# Patient Record
Sex: Male | Born: 1965 | Race: White | Hispanic: No | Marital: Single | Smoking: Current some day smoker
Health system: Southern US, Community
[De-identification: ages and names within clinical notes are randomized; demographics above are authoritative.]

## PROBLEM LIST (undated history)

## (undated) ENCOUNTER — Emergency Department (HOSPITAL_COMMUNITY): Admission: EM | Payer: BC Managed Care – PPO | Source: Home / Self Care

## (undated) DIAGNOSIS — T7840XA Allergy, unspecified, initial encounter: Secondary | ICD-10-CM

## (undated) DIAGNOSIS — E119 Type 2 diabetes mellitus without complications: Secondary | ICD-10-CM

## (undated) DIAGNOSIS — Z87442 Personal history of urinary calculi: Secondary | ICD-10-CM

## (undated) DIAGNOSIS — F32A Depression, unspecified: Secondary | ICD-10-CM

## (undated) DIAGNOSIS — F419 Anxiety disorder, unspecified: Secondary | ICD-10-CM

## (undated) DIAGNOSIS — G4730 Sleep apnea, unspecified: Secondary | ICD-10-CM

## (undated) DIAGNOSIS — F329 Major depressive disorder, single episode, unspecified: Secondary | ICD-10-CM

## (undated) HISTORY — DX: Depression, unspecified: F32.A

## (undated) HISTORY — DX: Allergy, unspecified, initial encounter: T78.40XA

## (undated) HISTORY — DX: Major depressive disorder, single episode, unspecified: F32.9

## (undated) HISTORY — DX: Anxiety disorder, unspecified: F41.9

---

## 2010-01-27 ENCOUNTER — Encounter: Admission: RE | Admit: 2010-01-27 | Discharge: 2010-01-27 | Payer: Self-pay | Admitting: Specialist

## 2010-01-28 ENCOUNTER — Encounter: Admission: RE | Admit: 2010-01-28 | Discharge: 2010-01-28 | Payer: Self-pay | Admitting: Specialist

## 2012-05-28 ENCOUNTER — Ambulatory Visit (INDEPENDENT_AMBULATORY_CARE_PROVIDER_SITE_OTHER): Payer: BC Managed Care – PPO | Admitting: Family Medicine

## 2012-05-28 VITALS — BP 134/82 | HR 90 | Temp 98.6°F | Resp 16 | Ht 61.78 in | Wt 293.0 lb

## 2012-05-28 DIAGNOSIS — L237 Allergic contact dermatitis due to plants, except food: Secondary | ICD-10-CM

## 2012-05-28 DIAGNOSIS — L255 Unspecified contact dermatitis due to plants, except food: Secondary | ICD-10-CM

## 2012-05-28 MED ORDER — PREDNISONE 20 MG PO TABS: ORAL_TABLET | ORAL | Status: AC

## 2012-05-28 NOTE — Progress Notes (Signed)
   Date:  05/28/2012   Name:  Evan Hamilton   DOB:  02/10/1966   MRN:  [id number]  PCP:  No primary provider on file.    Chief Complaint: Rash   History of Present Illness:  Evan Hamilton is a 46 y.o. very pleasant male patient who presents with the following:  He was hiking a couple of weeks ago- seemed to get into some PI. Had a rash on both arms, his trunk, and especially his left leg.  It was getting better, but then seemed to be getting worse again last night.  He has used benadryl and OTC PI medications, and has tried to wash anything that might have PI oil on it  Otherwise he is feeling ok, does not have any fever or other symptoms and he does not have DM or glucose problems that he knows of  There is no problem list on file for this patient.  No past medical history on file. No past surgical history on file. History  Substance Use Topics  . Smoking status: Current Some Day Smoker    Types: Cigars  . Smokeless tobacco: Not on file  . Alcohol Use: Not on file   No family history on file. Allergies not on file  Medication list has been reviewed and updated.  No current outpatient prescriptions on file prior to visit.    Review of Systems:  As per HPI- otherwise negative.  Physical Examination: Filed Vitals:   05/28/12 1521  BP: 134/82  Pulse: 90  Temp: 98.6 F (37 C)  Resp: 16   Filed Vitals:   05/28/12 1521  Height: 5' 1.78" (1.569 m)  Weight: 293 lb (132.904 kg)   Body mass index is 53.97 kg/(m^2). Ideal Body Weight: Weight in (lb) to have BMI = 25: 135.4   GEN: WDWN, NAD, Non-toxic, A & O x 3, obese HEENT: Atraumatic, Normocephalic. Neck supple. No masses, No LAD.  Oropharynx wnl Ears and Nose: No external deformity. CV: RRR, No M/G/R. No JVD. No thrill. No extra heart sounds. PULM: CTA B, no wheezes, crackles, rhonchi. No retractions. No resp. distress. No accessory muscle use. EXTR: No c/c/e.  Rash typical of rhus dermatitis on both arms, which  appears to be healing.  However, he does have fresher appearing rash on his left leg.  No sign of superinfection or other rash NEURO Normal gait.  PSYCH: Normally interactive. Conversant. Not depressed or anxious appearing.  Calm demeanor.    Assessment and Plan: 1. Poison ivy  predniSONE (DELTASONE) 20 MG tablet   Will treat with a 10 day course of oral prednisone.  Patient (or parent if minor) instructed to return to clinic or call if not better in 2-2 day(s).  Sooner if worse.      Abbe Amsterdam, MD

## 2013-10-09 ENCOUNTER — Ambulatory Visit (INDEPENDENT_AMBULATORY_CARE_PROVIDER_SITE_OTHER): Payer: BC Managed Care – PPO | Admitting: Family Medicine

## 2013-10-09 ENCOUNTER — Ambulatory Visit: Payer: BC Managed Care – PPO

## 2013-10-09 VITALS — BP 128/80 | HR 72 | Temp 99.1°F | Resp 18 | Ht 73.0 in | Wt >= 6400 oz

## 2013-10-09 DIAGNOSIS — Z131 Encounter for screening for diabetes mellitus: Secondary | ICD-10-CM

## 2013-10-09 DIAGNOSIS — R109 Unspecified abdominal pain: Secondary | ICD-10-CM

## 2013-10-09 DIAGNOSIS — R319 Hematuria, unspecified: Secondary | ICD-10-CM

## 2013-10-09 DIAGNOSIS — R300 Dysuria: Secondary | ICD-10-CM

## 2013-10-09 LAB — POCT URINALYSIS DIPSTICK
Bilirubin, UA: NEGATIVE
Glucose, UA: NEGATIVE
Ketones, UA: NEGATIVE
Leukocytes, UA: NEGATIVE
Nitrite, UA: NEGATIVE
Protein, UA: 100
Spec Grav, UA: 1.025
Urobilinogen, UA: 0.2
pH, UA: 5

## 2013-10-09 LAB — POCT UA - MICROSCOPIC ONLY
Casts, Ur, LPF, POC: NEGATIVE
Crystals, Ur, HPF, POC: NEGATIVE
Mucus, UA: NEGATIVE
Yeast, UA: NEGATIVE

## 2013-10-09 LAB — POCT CBC
Granulocyte percent: 59.3 %G (ref 37–80)
HCT, POC: 44.3 % (ref 43.5–53.7)
Hemoglobin: 14.6 g/dL (ref 14.1–18.1)
Lymph, poc: 3.3 (ref 0.6–3.4)
MCH, POC: 30.2 pg (ref 27–31.2)
MCHC: 33 g/dL (ref 31.8–35.4)
MCV: 91.7 fL (ref 80–97)
MID (cbc): 0.6 (ref 0–0.9)
MPV: 8.8 fL (ref 0–99.8)
POC Granulocyte: 5.6 (ref 2–6.9)
POC LYMPH PERCENT: 34.4 %L (ref 10–50)
POC MID %: 6.3 % (ref 0–12)
Platelet Count, POC: 171 10*3/uL (ref 142–424)
RBC: 4.83 M/uL (ref 4.69–6.13)
RDW, POC: 13 %
WBC: 9.5 10*3/uL (ref 4.6–10.2)

## 2013-10-09 LAB — POCT GLYCOSYLATED HEMOGLOBIN (HGB A1C): Hemoglobin A1C: 7.2

## 2013-10-09 MED ORDER — TRAMADOL HCL 50 MG PO TABS: 50.0000 mg | ORAL_TABLET | Freq: Three times a day (TID) | ORAL | Status: DC | PRN

## 2013-10-09 MED ORDER — CIPROFLOXACIN HCL 500 MG PO TABS: 500.0000 mg | ORAL_TABLET | Freq: Two times a day (BID) | ORAL | Status: DC

## 2013-10-09 MED ORDER — TAMSULOSIN HCL 0.4 MG PO CAPS: 0.4000 mg | ORAL_CAPSULE | Freq: Every day | ORAL | Status: DC

## 2013-10-09 MED ORDER — KETOROLAC TROMETHAMINE 60 MG/2ML IM SOLN
60.0000 mg | Freq: Once | INTRAMUSCULAR | Status: AC
  Administered 2013-10-09: 60 mg via INTRAMUSCULAR

## 2013-10-09 NOTE — Patient Instructions (Signed)

## 2013-10-09 NOTE — Progress Notes (Signed)
 [facility]:  Office Visit  Chief Complaint:  Chief Complaint  Patient presents with  . Flank Pain    started last friday on left side no improvements    HPI: Evan Hamilton is a 47 y.o. male who is here for  Left sided flank pain, depends on how he twist, has had back pain for several years, he had an MRI for T12 compression fracture but this does not feel like it.  Subjective  fevers and chills 2 weekends ago, urinating blood x 1 on Monday, this has been going on for 5 days now.  He deneis blood in stool. Stabbing 10/10 pain. He nearly  keels over when this happens Able to eat and drink ok, subsides with food.  Denies any  abd pain No history of kideny stones.  Has been drinking a lot of water, drinking cranberry juice, had an antibiotics he orgot the name only for 2 days Was urinating more frequently and got better after that , better flow and more steady He lost 18 lbs in the last 3 weeks intentionally, it was a lot faster than he thought, drinking protein shakes for breakfast, luncha nd eating a sensible dinner. He deneis h.o Dm but has a fmaily hsitory of it so would like to check. No polyuria/polydipsia.     Past Medical History  Diagnosis Date  . Allergy    History reviewed. No pertinent past surgical history. History   Social History  . Marital Status: Married    Spouse Name: N/A    Number of Children: N/A  . Years of Education: N/A   Social History Main Topics  . Smoking status: Current Some Day Smoker    Types: Cigars  . Smokeless tobacco: None  . Alcohol Use: 1.5 oz/week    3 drink(s) per week  . Drug Use: No  . Sexual Activity: None   Other Topics Concern  . None   Social History Narrative  . None   Family History  Problem Relation Age of Onset  . Cancer Mother     Lung  . Diabetes Mother   . Heart disease Father   . Hyperlipidemia Father   . Hypertension Father   . Mental illness Sister   . Cancer Maternal Grandmother    Lung  . Diabetes Maternal Grandmother    Allergies  Allergen Reactions  . Penicillins Hives and Rash   Prior to Admission medications   Medication Sig Start Date End Date Taking? Authorizing Provider  Multiple Vitamin (MULTIVITAMIN) tablet Take 1 tablet by mouth daily.   Yes Historical Provider, MD  CINNAMON PO Take 2 tablets by mouth daily.    Historical Provider, MD     ROS: The patient denies  night sweats, unintentional weight loss, chest pain, palpitations, wheezing, dyspnea on exertion, nausea, vomiting, abdominal pain, dysuria, melena, numbness, weakness, or tingling.   All other systems have been reviewed and were otherwise negative with the exception of those mentioned in the HPI and as above.    PHYSICAL EXAM: Filed Vitals:   10/09/13 1729  BP: 128/80  Pulse: 72  Temp: 99.1 F (37.3 C)  Resp: 18   Filed Vitals:   10/09/13 1729  Height: 6\' 1"  (1.854 m)  Weight: 400 lb 9.6 oz (181.711 kg)   Body mass index is 52.86 kg/(m^2).  General: Alert, no acute distress, morbidly obese white male HEENT:  Normocephalic, atraumatic, oropharynx patent. EOMI, PERRLA Cardiovascular:  Regular rate and rhythm, no rubs murmurs  or gallops.  No Carotid bruits, radial pulse intact. No pedal edema.  Respiratory: Clear to auscultation bilaterally.  No wheezes, rales, or rhonchi.  No cyanosis, no use of accessory musculature GI: No organomegaly, abdomen is soft and non-tender, positive bowel sounds.  No masses. Skin: No rashes. Neurologic: Facial musculature symmetric. Psychiatric: Patient is appropriate throughout our interaction. Lymphatic: No cervical lymphadenopathy Musculoskeletal: Gait intact. + CVA tenderness   LABS: Results for orders placed in visit on 10/09/13  POCT CBC      Result Value Range   WBC 9.5  4.6 - 10.2 K/uL   Lymph, poc 3.3  0.6 - 3.4   POC LYMPH PERCENT 34.4  10 - 50 %L   MID (cbc) 0.6  0 - 0.9   POC MID % 6.3  0 - 12 %M   POC Granulocyte 5.6  2 - 6.9    Granulocyte percent 59.3  37 - 80 %G   RBC 4.83  4.69 - 6.13 M/uL   Hemoglobin 14.6  14.1 - 18.1 g/dL   HCT, POC 96.0  45.4 - 53.7 %   MCV 91.7  80 - 97 fL   MCH, POC 30.2  27 - 31.2 pg   MCHC 33.0  31.8 - 35.4 g/dL   RDW, POC 09.8     Platelet Count, POC 171  142 - 424 K/uL   MPV 8.8  0 - 99.8 fL  POCT UA - MICROSCOPIC ONLY      Result Value Range   WBC, Ur, HPF, POC 2-4     RBC, urine, microscopic 1-2     Bacteria, U Microscopic trace     Mucus, UA neg     Epithelial cells, urine per micros 1-2     Crystals, Ur, HPF, POC neg     Casts, Ur, LPF, POC neg     Yeast, UA neg    POCT URINALYSIS DIPSTICK      Result Value Range   Color, UA yellow     Clarity, UA clear     Glucose, UA neg     Bilirubin, UA neg     Ketones, UA neg     Spec Grav, UA 1.025     Blood, UA trace     pH, UA 5.0     Protein, UA 100     Urobilinogen, UA 0.2     Nitrite, UA neg     Leukocytes, UA Negative    POCT GLYCOSYLATED HEMOGLOBIN (HGB A1C)      Result Value Range   Hemoglobin A1C 7.2       EKG/XRAY:   Primary read interpreted by Dr. Conley Rolls at [facility].   ASSESSMENT/PLAN: Encounter Diagnoses  Name Primary?  . Flank pain Yes  . Dysuria   . Hematuria   . Screening for diabetes mellitus    Unable to get abd xray due to body habitus, if he needs a CT scan then will need to send him to [address]. Will see how sxs treatment for possible Renal stones work out first Will give patient strainer Toradol 60 mg IM  Rx Flomax and also Tramadol Push fluids F/u in 2 days by phone  Gross sideeffects, risk and benefits, and alternatives of medications d/w patient. Patient is aware that all medications have potential sideeffects and we are unable to predict every sideeffect or drug-drug interaction that may occur.  ,  PHUONG, DO 10/09/2013 8:34 PM  Spoke with patient after he left about HbA1C test  and newly dx of diabetes. We will consider metformin once we get his lab results back.

## 2013-10-10 ENCOUNTER — Telehealth: Payer: Self-pay | Admitting: Family Medicine

## 2013-10-10 LAB — COMPREHENSIVE METABOLIC PANEL
ALT: 76 U/L — ABNORMAL HIGH (ref 0–53)
AST: 49 U/L — ABNORMAL HIGH (ref 0–37)
Albumin: 4.1 g/dL (ref 3.5–5.2)
Calcium: 9.3 mg/dL (ref 8.4–10.5)
Creat: 1.02 mg/dL (ref 0.50–1.35)

## 2013-10-10 LAB — COMPREHENSIVE METABOLIC PANEL WITH GFR
Alkaline Phosphatase: 56 U/L (ref 39–117)
BUN: 13 mg/dL (ref 6–23)
CO2: 26 meq/L (ref 19–32)
Chloride: 99 meq/L (ref 96–112)
Glucose, Bld: 118 mg/dL — ABNORMAL HIGH (ref 70–99)
Potassium: 4.2 meq/L (ref 3.5–5.3)
Sodium: 134 meq/L — ABNORMAL LOW (ref 135–145)
Total Bilirubin: 0.7 mg/dL (ref 0.3–1.2)
Total Protein: 7.8 g/dL (ref 6.0–8.3)

## 2013-10-10 NOTE — Telephone Encounter (Signed)
Pain comes and goes, no worse, d/w pt dx diabetes, will await cmp befoe syarting metformin. Recheck with him in am, if no better then consider ct scan

## 2013-10-11 ENCOUNTER — Other Ambulatory Visit: Payer: Self-pay | Admitting: Family Medicine

## 2013-10-11 DIAGNOSIS — R109 Unspecified abdominal pain: Secondary | ICD-10-CM

## 2013-10-13 ENCOUNTER — Telehealth: Payer: Self-pay | Admitting: Family Medicine

## 2013-10-21 ENCOUNTER — Telehealth: Payer: Self-pay

## 2013-10-21 NOTE — Telephone Encounter (Signed)
Patient states at his last visit he discussed going on a diabetic medication. Patient wants to know if he can this med. Does not remember the name of it  [phone]

## 2013-10-21 NOTE — Telephone Encounter (Signed)
Please advise, see CMP

## 2013-10-22 MED ORDER — METFORMIN HCL 500 MG PO TABS: 500.0000 mg | ORAL_TABLET | Freq: Two times a day (BID) | ORAL | Status: DC

## 2013-10-22 NOTE — Telephone Encounter (Signed)
Patient advised per Dr Conley Rolls, he will take meds and let me know if he decides to see the nutritionist, he wants to think about it. He will let me know if he has problems with the medications.

## 2013-11-29 ENCOUNTER — Other Ambulatory Visit: Payer: Self-pay | Admitting: Family Medicine

## 2014-07-10 ENCOUNTER — Encounter: Payer: Self-pay | Admitting: Family Medicine

## 2014-07-10 ENCOUNTER — Emergency Department (HOSPITAL_COMMUNITY)
Admission: EM | Admit: 2014-07-10 | Discharge: 2014-07-10 | Disposition: A | Discharge status: Home / Self Care | Payer: BC Managed Care – PPO | Attending: Emergency Medicine | Admitting: Emergency Medicine

## 2014-07-10 ENCOUNTER — Emergency Department (HOSPITAL_COMMUNITY): Payer: BC Managed Care – PPO

## 2014-07-10 ENCOUNTER — Encounter (HOSPITAL_COMMUNITY): Payer: Self-pay | Admitting: Emergency Medicine

## 2014-07-10 ENCOUNTER — Ambulatory Visit (INDEPENDENT_AMBULATORY_CARE_PROVIDER_SITE_OTHER): Payer: BC Managed Care – PPO | Admitting: Family Medicine

## 2014-07-10 VITALS — BP 142/88 | HR 72 | Temp 97.4°F | Resp 18 | Ht 71.25 in | Wt 364.6 lb

## 2014-07-10 DIAGNOSIS — R61 Generalized hyperhidrosis: Secondary | ICD-10-CM | POA: Diagnosis not present

## 2014-07-10 DIAGNOSIS — F17200 Nicotine dependence, unspecified, uncomplicated: Secondary | ICD-10-CM | POA: Diagnosis not present

## 2014-07-10 DIAGNOSIS — Z79899 Other long term (current) drug therapy: Secondary | ICD-10-CM | POA: Diagnosis not present

## 2014-07-10 DIAGNOSIS — K8020 Calculus of gallbladder without cholecystitis without obstruction: Secondary | ICD-10-CM

## 2014-07-10 DIAGNOSIS — Z880 Allergy status to penicillin: Secondary | ICD-10-CM | POA: Insufficient documentation

## 2014-07-10 DIAGNOSIS — R079 Chest pain, unspecified: Secondary | ICD-10-CM | POA: Diagnosis present

## 2014-07-10 DIAGNOSIS — R0602 Shortness of breath: Secondary | ICD-10-CM | POA: Insufficient documentation

## 2014-07-10 DIAGNOSIS — E119 Type 2 diabetes mellitus without complications: Secondary | ICD-10-CM | POA: Insufficient documentation

## 2014-07-10 DIAGNOSIS — R1013 Epigastric pain: Secondary | ICD-10-CM

## 2014-07-10 DIAGNOSIS — Z8249 Family history of ischemic heart disease and other diseases of the circulatory system: Secondary | ICD-10-CM

## 2014-07-10 DIAGNOSIS — IMO0001 Reserved for inherently not codable concepts without codable children: Secondary | ICD-10-CM

## 2014-07-10 HISTORY — DX: Type 2 diabetes mellitus without complications: E11.9

## 2014-07-10 LAB — HEPATIC FUNCTION PANEL
ALT: 172 U/L — ABNORMAL HIGH (ref 0–53)
AST: 216 U/L — ABNORMAL HIGH (ref 0–37)
Albumin: 3.3 g/dL — ABNORMAL LOW (ref 3.5–5.2)
Alkaline Phosphatase: 106 U/L (ref 39–117)
BILIRUBIN DIRECT: 1 mg/dL — AB (ref 0.0–0.3)
BILIRUBIN INDIRECT: 0.4 mg/dL (ref 0.3–0.9)
Total Bilirubin: 1.4 mg/dL — ABNORMAL HIGH (ref 0.3–1.2)
Total Protein: 7.1 g/dL (ref 6.0–8.3)

## 2014-07-10 LAB — CBC
HCT: 41.5 % (ref 39.0–52.0)
HEMOGLOBIN: 14.7 g/dL (ref 13.0–17.0)
MCH: 29.9 pg (ref 26.0–34.0)
MCHC: 35.4 g/dL (ref 30.0–36.0)
MCV: 84.5 fL (ref 78.0–100.0)
PLATELETS: 135 10*3/uL — AB (ref 150–400)
RBC: 4.91 MIL/uL (ref 4.22–5.81)
RDW: 12.9 % (ref 11.5–15.5)
WBC: 8.2 10*3/uL (ref 4.0–10.5)

## 2014-07-10 LAB — BASIC METABOLIC PANEL
ANION GAP: 11 (ref 5–15)
BUN: 15 mg/dL (ref 6–23)
CALCIUM: 9.3 mg/dL (ref 8.4–10.5)
CO2: 29 mEq/L (ref 19–32)
Chloride: 103 mEq/L (ref 96–112)
Creatinine, Ser: 0.93 mg/dL (ref 0.50–1.35)
Glucose, Bld: 139 mg/dL — ABNORMAL HIGH (ref 70–99)
POTASSIUM: 4 meq/L (ref 3.7–5.3)
SODIUM: 143 meq/L (ref 137–147)

## 2014-07-10 LAB — I-STAT TROPONIN, ED: Troponin i, poc: 0 ng/mL (ref 0.00–0.08)

## 2014-07-10 LAB — GLUCOSE, POCT (MANUAL RESULT ENTRY): POC GLUCOSE: 67 mg/dL — AB (ref 70–99)

## 2014-07-10 LAB — LIPASE, BLOOD: LIPASE: 46 U/L (ref 11–59)

## 2014-07-10 MED ORDER — NITROGLYCERIN 0.3 MG SL SUBL: 0.4000 mg | SUBLINGUAL_TABLET | SUBLINGUAL | Status: DC | PRN

## 2014-07-10 MED ORDER — ESOMEPRAZOLE MAGNESIUM 40 MG PO CPDR: 40.0000 mg | DELAYED_RELEASE_CAPSULE | Freq: Every day | ORAL | Status: DC

## 2014-07-10 MED ORDER — ONDANSETRON 8 MG PO TBDP: ORAL_TABLET | ORAL | Status: DC

## 2014-07-10 MED ORDER — OXYCODONE-ACETAMINOPHEN 5-325 MG PO TABS: 1.0000 | ORAL_TABLET | ORAL | Status: DC | PRN

## 2014-07-10 MED ORDER — NITROGLYCERIN 0.4 MG SL SUBL: 0.4000 mg | SUBLINGUAL_TABLET | SUBLINGUAL | Status: DC | PRN

## 2014-07-10 MED ORDER — FENTANYL CITRATE 0.05 MG/ML IJ SOLN
50.0000 ug | Freq: Once | INTRAMUSCULAR | Status: AC
  Administered 2014-07-10: 50 ug via INTRAVENOUS
  Filled 2014-07-10: qty 2

## 2014-07-10 MED ORDER — NITROGLYCERIN 0.3 MG SL SUBL
0.4000 mg | SUBLINGUAL_TABLET | Freq: Once | SUBLINGUAL | Status: AC
  Administered 2014-07-10: 0.3 mg via SUBLINGUAL

## 2014-07-10 NOTE — ED Notes (Signed)
Pt presents from urgent care with chest pain. Pt reports waking up this AM with substernal CP, SOB. Pt reports taking 2 doses of alka seltzer with relief until lunchtime when the pain returned. Pt was given 2 nitro with relief of CP. Pt no c/o epigastric pain 6/10. NAD noted. VSS.

## 2014-07-10 NOTE — Discharge Instructions (Signed)
Cholecystitis °Cholecystitis is an inflammation of your gallbladder. It is usually caused by a buildup of gallstones or sludge (cholelithiasis) in your gallbladder. The gallbladder stores a fluid that helps digest fats (bile). Cholecystitis is serious and needs treatment right away.  °CAUSES  °· Gallstones. Gallstones can block the tube that leads to your gallbladder, causing bile to build up. As bile builds up, the gallbladder becomes inflamed. °· Bile duct problems, such as blockage from scarring or kinking. °· Tumors. Tumors can stop bile from leaving your gallbladder correctly, causing bile to build up. As bile builds up, the gallbladder becomes inflamed. °SYMPTOMS  °· Nausea. °· Vomiting. °· Abdominal pain, especially in the upper right area of your abdomen. °· Abdominal tenderness or bloating. °· Sweating. °· Chills. °· Fever. °· Yellowing of the skin and the whites of the eyes (jaundice). °DIAGNOSIS  °Your caregiver may order blood tests to look for infection or gallbladder problems. Your caregiver may also order imaging tests, such as an ultrasound or computed tomography (CT) scan. Further tests may include a hepatobiliary iminodiacetic acid (HIDA) scan. This scan allows your caregiver to see your bile move from the liver to the gallbladder and to the small intestine. °TREATMENT  °A hospital stay is usually necessary to lessen the inflammation of your gallbladder. You may be required to not eat or drink (fast) for a certain amount of time. You may be given medicine to treat pain or an antibiotic medicine to treat an infection. Surgery may be needed to remove your gallbladder (cholecystectomy) once the inflammation has gone down. Surgery may be needed right away if you develop complications such as death of gallbladder tissue (gangrene) or a tear (perforation) of the gallbladder.  °HOME CARE INSTRUCTIONS  °Home care will depend on your treatment. In general: °· If you were given antibiotics, take them as  directed. Finish them even if you start to feel better. °· Only take over-the-counter or prescription medicines for pain, discomfort, or fever as directed by your caregiver. °· Follow a low-fat diet until you see your caregiver again. °· Keep all follow-up visits as directed by your caregiver. °SEEK IMMEDIATE MEDICAL CARE IF:  °· Your pain is increasing and not controlled by medicines. °· Your pain moves to another part of your abdomen or to your back. °· You have a fever. °· You have nausea and vomiting. °MAKE SURE YOU: °· Understand these instructions. °· Will watch your condition. °· Will get help right away if you are not doing well or get worse. °Document Released: 11/06/2005 Document Revised: 01/29/2012 Document Reviewed: 09/22/2011 °Exit[employer]® Patient Information ©2015 [employer]. This information is not intended to replace advice given to you by your health care provider. Make sure you discuss any questions you have with your health care provider. ° °Low-Fat Diet for Pancreatitis or Gallbladder Conditions °A low-fat diet can be helpful if you have pancreatitis or a gallbladder condition. With these conditions, your pancreas and gallbladder have trouble digesting fats. A healthy eating plan with less fat will help rest your pancreas and gallbladder and reduce your symptoms. °WHAT DO I NEED TO KNOW ABOUT THIS DIET? °· Eat a low-fat diet. °¨ Reduce your fat intake to less than 20-30% of your total daily calories. This is less than 50-60 g of fat per day. °¨ Remember that you need some fat in your diet. Ask your dietician what your daily goal should be. °¨ Choose nonfat and low-fat healthy foods. Look for the words "nonfat," "low fat," or "  fat free." °¨ As a guide, look on the label and choose foods with less than 3 g of fat per serving. Eat only one serving. °· Avoid alcohol. °· Do not smoke. If you need help quitting, talk with your health care provider. °· Eat small frequent meals instead of three large  heavy meals. °WHAT FOODS CAN I EAT? °Grains °Include healthy grains and starches such as potatoes, wheat bread, fiber-rich cereal, and brown rice. Choose whole grain options whenever possible. In adults, whole grains should account for 45-65% of your daily calories.  °Fruits and Vegetables °Eat plenty of fruits and vegetables. Fresh fruits and vegetables add fiber to your diet. °Meats and Other Protein Sources °Eat lean meat such as chicken and pork. Trim any fat off of meat before cooking it. Eggs, fish, and beans are other sources of protein. In adults, these foods should account for 10-35% of your daily calories. °Dairy °Choose low-fat milk and dairy options. Dairy includes fat and protein, as well as calcium.  °Fats and Oils °Limit high-fat foods such as fried foods, sweets, baked goods, sugary drinks.  °Other °Creamy sauces and condiments, such as mayonnaise, can add extra fat. Think about whether or not you need to use them, or use smaller amounts or low fat options. °WHAT FOODS ARE NOT RECOMMENDED? °· High fat foods, such as: °¨ Baked goods. °¨ Ice cream. °¨ French toast. °¨ Sweet rolls. °¨ Pizza. °¨ Cheese bread. °¨ Foods covered with batter, butter, creamy sauces, or cheese. °¨ Fried foods. °¨ Sugary drinks and desserts. °· Foods that cause gas or bloating °Document Released: 11/11/2013 Document Reviewed: 11/11/2013 °ExitCare® Patient Information ©2015 [employer]. This information is not intended to replace advice given to you by your health care provider. Make sure you discuss any questions you have with your health care provider. ° °

## 2014-07-10 NOTE — ED Provider Notes (Signed)
CSN: [id number]     Arrival date & time 07/10/14  1525 History   First MD Initiated Contact with Patient 07/10/14 1526     Chief Complaint  Patient presents with  . Chest Pain     (Consider location/radiation/quality/duration/timing/severity/associated sxs/prior Treatment) HPI Comments: Pt presents with upper abd/chest pain.  He started having a sharp/aching pain to epigastric area, sternum about 7am this morning.  It has been constant since that time.  He says that he has had some associated SOB, no n/v, did get diaphoretic with the intense pain.  No hx of heart problems in the past.  Has recently diagnosed DM, started on metformin, but he stopped it himself after he didn't like the side effects.  No known hx of HTN, hyperlipidemia.  +FH of heart dz in his father in his late 5550s.  Denies f/c, cough, leg swelling/pain.  Says pain has radiated around to back and under right ribcage.  Pt was given nitro PTA which did improve some of the pain, but now hurts more in epigastrium, RUQ.  No ASA given.  Patient is a 48 y.o. male presenting with chest pain.  Chest Pain Associated symptoms: abdominal pain, diaphoresis and shortness of breath   Associated symptoms: no back pain, no cough, no dizziness, no fatigue, no fever, no headache, no nausea, no numbness, not vomiting and no weakness     Past Medical History  Diagnosis Date  . Allergy   . Diabetes mellitus without complication    History reviewed. No pertinent past surgical history. Family History  Problem Relation Age of Onset  . Cancer Mother     Lung  . Diabetes Mother   . Heart disease Father   . Hyperlipidemia Father   . Hypertension Father   . Mental illness Sister   . Cancer Maternal Grandmother     Lung  . Diabetes Maternal Grandmother    History  Substance Use Topics  . Smoking status: Current Some Day Smoker    Types: Cigars  . Smokeless tobacco: Not on file  . Alcohol Use: 1.5 oz/week    3 drink(s) per week     Review of Systems  Constitutional: Positive for diaphoresis. Negative for fever, chills and fatigue.  HENT: Negative for congestion, rhinorrhea and sneezing.   Eyes: Negative.   Respiratory: Positive for shortness of breath. Negative for cough and chest tightness.   Cardiovascular: Positive for chest pain. Negative for leg swelling.  Gastrointestinal: Positive for abdominal pain. Negative for nausea, vomiting, diarrhea and blood in stool.  Genitourinary: Negative for frequency, hematuria, flank pain and difficulty urinating.  Musculoskeletal: Negative for arthralgias and back pain.  Skin: Negative for rash.  Neurological: Negative for dizziness, speech difficulty, weakness, numbness and headaches.      Allergies  Penicillins  Home Medications   Prior to Admission medications   Medication Sig Start Date End Date Taking? Authorizing Provider  Cyanocobalamin (VITAMIN B-12 PO) Take 1 tablet by mouth daily.   Yes Historical Provider, MD  Ginkgo Biloba (GINKOBA PO) Take 1 tablet by mouth daily.   Yes Historical Provider, MD  Multiple Vitamin (MULTIVITAMIN) tablet Take 1 tablet by mouth daily.   Yes Historical Provider, MD  nitroGLYCERIN (NITROSTAT) 0.4 MG SL tablet Place 0.4 mg under the tongue every 5 (five) minutes as needed for chest pain.   Yes Historical Provider, MD  omega-3 acid ethyl esters (LOVAZA) 1 G capsule Take 1 g by mouth daily.   Yes Historical Provider, MD  POTASSIUM  GLUCONATE PO Take 1 tablet by mouth daily.   Yes Historical Provider, MD  Probiotic Product (PROBIOTIC PO) Take 1 tablet by mouth daily.   Yes Historical Provider, MD  esomeprazole (NEXIUM) 40 MG capsule Take 1 capsule (40 mg total) by mouth daily. 07/10/14   Rolan Bucco, MD  ondansetron (ZOFRAN ODT) 8 MG disintegrating tablet 8mg  ODT q4 hours prn nausea 07/10/14   Rolan Bucco, MD  oxyCODONE-acetaminophen (PERCOCET) 5-325 MG per tablet Take 1-2 tablets by mouth every 4 (four) hours as needed. 07/10/14    Rolan Bucco, MD   BP 115/69  Pulse 60  Temp(Src) 98 F (36.7 C) (Oral)  Resp 15  Ht 6\' 2"  (1.88 m)  Wt 360 lb (163.295 kg)  BMI 46.20 kg/m2  SpO2 98% Physical Exam  Constitutional: He is oriented to person, place, and time. He appears well-developed and well-nourished.  HENT:  Head: Normocephalic and atraumatic.  Eyes: Pupils are equal, round, and reactive to light.  Neck: Normal range of motion. Neck supple.  Cardiovascular: Normal rate, regular rhythm and normal heart sounds.   Pulmonary/Chest: Effort normal and breath sounds normal. No respiratory distress. He has no wheezes. He has no rales. He exhibits no tenderness.  Abdominal: Soft. Bowel sounds are normal. There is tenderness (+TTP epigastrium, RUQ). There is no rebound and no guarding.  Musculoskeletal: Normal range of motion. He exhibits no edema.  Lymphadenopathy:    He has no cervical adenopathy.  Neurological: He is alert and oriented to person, place, and time.  Skin: Skin is warm and dry. No rash noted.  Psychiatric: He has a normal mood and affect.    ED Course  Procedures (including critical care time) Labs Review Labs Reviewed  CBC - Abnormal; Notable for the following:    Platelets 135 (*)    All other components within normal limits  BASIC METABOLIC PANEL - Abnormal; Notable for the following:    Glucose, Bld 139 (*)    All other components within normal limits  HEPATIC FUNCTION PANEL - Abnormal; Notable for the following:    Albumin 3.3 (*)    AST 216 (*)    ALT 172 (*)    Total Bilirubin 1.4 (*)    Bilirubin, Direct 1.0 (*)    All other components within normal limits  LIPASE, BLOOD  I-STAT TROPOININ, ED    Imaging Review Dg Chest 2 View  07/10/2014   CLINICAL DATA:  Short of breath.  Chest pain and epigastric pain  EXAM: CHEST  2 VIEW  COMPARISON:  None  FINDINGS: Normal heart size. No pleural effusion or edema. Scar is identified within the left base. No airspace consolidation. Right lung  is clear. T12 compression fracture is again noted.  IMPRESSION: 1. No acute cardiopulmonary abnormalities.   Electronically Signed   By: Signa Kell M.D.   On: 07/10/2014 17:11   US Abdomen Complete  07/10/2014   CLINICAL DATA:  Abdominal pain.  EXAM: ULTRASOUND ABDOMEN COMPLETE  COMPARISON:  None.  FINDINGS: Gallbladder:  Cholelithiasis is noted with 3 cm gallstone noted in neck of gallbladder. No significant gallbladder wall thickening or pericholecystic fluid is noted. No sonographic Murphy's sign is noted  Common bile duct:  Diameter: Measures 4.2 mm which is within normal limits.  Liver:  Increased echogenicity of hepatic parenchyma is noted suggesting fatty infiltration. No focal abnormality is noted.  IVC:  No abnormality visualized.  Pancreas:  Not well visualized due to body habitus.  Spleen:  Size and appearance  within normal limits.  Right Kidney:  Length: 14.5 cm. 1.8 cm cyst is noted in lower pole. Echogenicity within normal limits. No mass or hydronephrosis visualized.  Left Kidney:  Length: 14.4 cm. 3.2 cm cyst seen in lower pole. Echogenicity within normal limits. No mass or hydronephrosis visualized.  Abdominal aorta:  No aneurysm visualized.  Other findings:  None.  IMPRESSION: Cholelithiasis is noted without definite evidence of cholecystitis. Fatty infiltration of the liver.   Electronically Signed   By: Roque Lias M.D.   On: 07/10/2014 18:11     EKG Interpretation   Date/Time:  Friday July 10 2014 15:36:28 EDT Ventricular Rate:  56 PR Interval:  128 QRS Duration: 101 QT Interval:  416 QTC Calculation: 401 R Axis:   47 Text Interpretation:  Sinus rhythm Abnormal inferior Q waves No old  tracing to compare Confirmed by Sabin Gibeault  MD, Eiley Mcginnity ([id number]) on 07/10/2014  5:16:26 PM      MDM   Final diagnoses:  Gallstones    Patient presents with epigastric chest and right upper quadrant pain. He is tender on palpation to the epigastrium and right upper quadrant. He has  evidence of gallstones with a large 3 cm gallstone present. He also has elevation of his liver enzymes however his lipase is normal. I feel the symptoms are most consistent with gallbladder disease rather than angina. His EKG did not show ischemic changes and his troponin is negative. He's had no ongoing chest pain. His pain is currently dislocated in his upper abdomen. I consulted with surgery given his elevated liver enzymes. Patient has pain controlled in the ED. We will go ahead and send him home with close followup with surgery. I advised the patient of a strict low-fat diet. I gave him a prescription for Percocet Zofran and Nexium. Advised to return here if he has worsening pain vomiting or fevers. Otherwise he is to followup with the surgery.    Rolan Bucco, MD 07/10/14 [phone]

## 2014-07-10 NOTE — Progress Notes (Signed)
Subjective:    Patient ID: Evan Hamilton, male    DOB: Oct 11, 1966, 48 y.o.   MRN: [id number] This chart was scribed for Meredith StaggersJeffrey Misha Antonini, MD by Julian HyMorgan Graham, ED Scribe. The patient was seen in Room/bed info not found. The patient's care was started at 2:53 PM.   07/10/2014  Pt pulled acutely from waiting room, and then seen acutely by MD after EKG obtained d/t chest pain and symptoms.   Chest Pain  Associated symptoms include abdominal pain, diaphoresis and shortness of breath. Pertinent negatives include no nausea or vomiting.   Evan PartyMark Hamilton is a 48 y.o. male No PCP Per Patient  Pt reports he has mid-sternal constant, gradually worsening "sharp" chest pain onset approximately 7 am. Pt reports some diaphoresis. Pt reports he thought he had indigestion and attempted to belch with minimal relief. Pt reports he feels flush and has had some diaphoresis here in the office. Pt reports his father was in his 6450s when he started having heart issues. Pt reports his pain was 9/10 this morning, decreased and is currently 9/10. Pt reports his pain worsened after eating a salami and cheese sandwich. Pt denies his pain radiates down his arms. Pt does reports his pain radiates to the sides. Pt reports his father had stents and MI. Positive for SOB and LUQ abdominal pain. Pt reports he took 2 packets of Alka-Seltzer with minimal relief. Pt denies hx of bleeding ulcers in the abdomen. Pt reports he has limited feeling below his knees bilaterally onset several years ago.  In November 2014 A1C of 7.2 started on Metformin 500 mg but did not tolerate this due to feeling zombie-like, so stopped approximately 8 months ago. Pt denies taking any medications everyday including aspirin. Pt reports hx of indigestion. Pt denies nausea and vomiting.  Review of Systems  Constitutional: Positive for diaphoresis. Negative for chills.  Respiratory: Positive for shortness of breath.   Cardiovascular: Positive for chest pain.    Gastrointestinal: Positive for abdominal pain. Negative for nausea and vomiting.    Past Medical History  Diagnosis Date  . Allergy    No past surgical history on file. Allergies  Allergen Reactions  . Penicillins Hives and Rash   Current Outpatient Prescriptions  Medication Sig Dispense Refill  . CINNAMON PO Take 2 tablets by mouth daily.      . ciprofloxacin (CIPRO) 500 MG tablet Take 1 tablet (500 mg total) by mouth 2 (two) times daily.  10 tablet  0  . metFORMIN (GLUCOPHAGE) 500 MG tablet Take 1 tablet (500 mg total) by mouth 2 (two) times daily with a meal.  60 tablet  3  . Multiple Vitamin (MULTIVITAMIN) tablet Take 1 tablet by mouth daily.      . tamsulosin (FLOMAX) 0.4 MG CAPS capsule Take 1 capsule (0.4 mg total) by mouth daily.  30 capsule  0  . traMADol (ULTRAM) 50 MG tablet Take 1 tablet (50 mg total) by mouth every 8 (eight) hours as needed. Severe pain  30 tablet  0   No current facility-administered medications for this visit.       Objective:  Triage Vitals: BP 142/88  Pulse 72  Temp(Src) 97.4 F (36.3 C) (Oral)  Resp 18  Ht 5' 11.25" (1.81 m)  Wt 364 lb 9.6 oz (165.381 kg)  BMI 50.48 kg/m2  SpO2 98% Physical Exam  Vitals reviewed. Constitutional: He is oriented to person, place, and time. He appears well-developed and well-nourished.  Overweight, appears uncomfortable.  HENT:  Head: Normocephalic and atraumatic.  Eyes: EOM are normal. Pupils are equal, round, and reactive to light.  Neck: No JVD present. Carotid bruit is not present.  Cardiovascular: Normal rate, regular rhythm and normal heart sounds.   No murmur heard. Pulmonary/Chest: Effort normal and breath sounds normal. He has no rales.  Abdominal:  Epigastrium slight tenderness. No palpable pulsatile mass.  Musculoskeletal: He exhibits no edema.  Neurological: He is alert and oriented to person, place, and time.  Skin: Skin is warm. He is diaphoretic.  Psychiatric: He has a normal mood and  affect.   EMS called for transport approximately 14:50PM 2:54 PM-Manual BP reading 16109 we are going to give NTZ 0.4 mg/tablet.  EKG: sr, possible small Q in II and III, nonspecific ST anterolaterally without apparent ST elevation. No prior available for review.  Results for orders placed in visit on 07/10/14  GLUCOSE, POCT (MANUAL RESULT ENTRY)      Result Value Ref Range   POC Glucose 67 (*) 70 - 99 mg/dl     Point of Care glucose of 67. Assessment & Plan:  3:06 PM- Patient informed of current plan for treatment and evaluation and agrees with plan at this time.  1. Chest pain, unspecified   2. Type 2 diabetes mellitus without complication   3. Sweating   4. Abdominal pain, epigastric   5. Family history of heart disease    Substernal, lower mid chest pain since 7am, epigastric pain, diaphoretic in office with hx of DM2, off meds for past 8 months d/t intolerance.   Fh of early CAD.  DDX of esophageal cause with hx of GERD, but with diaphoresis and possible nonspecific EKG findings (no prior for comparison), ACS also possible. S/p ASA this am with alka seltzer,  NTG 0.4mg  SL x1 given here without relief. IV placed, monitor and transport to ER by EMS. charge nurse advised.   I personally performed the services described in this documentation, which was scribed in my presence. The recorded information has been reviewed and considered, and addended by me as needed.

## 2014-07-18 ENCOUNTER — Encounter (HOSPITAL_COMMUNITY): Payer: Self-pay | Admitting: Emergency Medicine

## 2014-07-18 ENCOUNTER — Inpatient Hospital Stay (HOSPITAL_COMMUNITY)
Admission: EM | Admit: 2014-07-18 | Discharge: 2014-07-23 | DRG: 418 | Disposition: A | Discharge status: Home / Self Care | Payer: BC Managed Care – PPO | Attending: Surgery | Admitting: Surgery

## 2014-07-18 DIAGNOSIS — K8060 Calculus of gallbladder and bile duct with cholecystitis, unspecified, without obstruction: Secondary | ICD-10-CM | POA: Diagnosis present

## 2014-07-18 DIAGNOSIS — E119 Type 2 diabetes mellitus without complications: Secondary | ICD-10-CM | POA: Diagnosis present

## 2014-07-18 DIAGNOSIS — Z880 Allergy status to penicillin: Secondary | ICD-10-CM

## 2014-07-18 DIAGNOSIS — Z833 Family history of diabetes mellitus: Secondary | ICD-10-CM | POA: Diagnosis not present

## 2014-07-18 DIAGNOSIS — K819 Cholecystitis, unspecified: Secondary | ICD-10-CM | POA: Diagnosis present

## 2014-07-18 DIAGNOSIS — K859 Acute pancreatitis without necrosis or infection, unspecified: Principal | ICD-10-CM | POA: Diagnosis present

## 2014-07-18 DIAGNOSIS — K8064 Calculus of gallbladder and bile duct with chronic cholecystitis without obstruction: Secondary | ICD-10-CM | POA: Diagnosis present

## 2014-07-18 DIAGNOSIS — K851 Biliary acute pancreatitis without necrosis or infection: Secondary | ICD-10-CM | POA: Diagnosis present

## 2014-07-18 DIAGNOSIS — R748 Abnormal levels of other serum enzymes: Secondary | ICD-10-CM | POA: Diagnosis present

## 2014-07-18 DIAGNOSIS — F17200 Nicotine dependence, unspecified, uncomplicated: Secondary | ICD-10-CM | POA: Diagnosis present

## 2014-07-18 DIAGNOSIS — Z6841 Body Mass Index (BMI) 40.0 and over, adult: Secondary | ICD-10-CM

## 2014-07-18 LAB — COMPREHENSIVE METABOLIC PANEL
ALK PHOS: 194 U/L — AB (ref 39–117)
ALT: 164 U/L — ABNORMAL HIGH (ref 0–53)
AST: 84 U/L — AB (ref 0–37)
Albumin: 3.6 g/dL (ref 3.5–5.2)
Anion gap: 12 (ref 5–15)
BUN: 11 mg/dL (ref 6–23)
CO2: 25 mEq/L (ref 19–32)
Calcium: 9.1 mg/dL (ref 8.4–10.5)
Chloride: 98 mEq/L (ref 96–112)
Creatinine, Ser: 0.82 mg/dL (ref 0.50–1.35)
GFR calc non Af Amer: >90 mL/min (ref >90)
GLUCOSE: 160 mg/dL — AB (ref 70–99)
Potassium: 3.9 mEq/L (ref 3.7–5.3)
Sodium: 135 mEq/L — ABNORMAL LOW (ref 137–147)
TOTAL PROTEIN: 7.8 g/dL (ref 6.0–8.3)
Total Bilirubin: 1.7 mg/dL — ABNORMAL HIGH (ref 0.3–1.2)

## 2014-07-18 LAB — CBC WITH DIFFERENTIAL/PLATELET
Basophils Absolute: 0 10*3/uL (ref 0.0–0.1)
Basophils Relative: 0 % (ref 0–1)
Eosinophils Absolute: 0.1 10*3/uL (ref 0.0–0.7)
Eosinophils Relative: 1 % (ref 0–5)
HCT: 44.1 % (ref 39.0–52.0)
Hemoglobin: 15.8 g/dL (ref 13.0–17.0)
LYMPHS ABS: 1.5 10*3/uL (ref 0.7–4.0)
LYMPHS PCT: 9 % — AB (ref 12–46)
MCH: 30.6 pg (ref 26.0–34.0)
MCHC: 35.8 g/dL (ref 30.0–36.0)
MCV: 85.5 fL (ref 78.0–100.0)
Monocytes Absolute: 0.8 10*3/uL (ref 0.1–1.0)
Monocytes Relative: 5 % (ref 3–12)
NEUTROS ABS: 13.1 10*3/uL — AB (ref 1.7–7.7)
Neutrophils Relative %: 85 % — ABNORMAL HIGH (ref 43–77)
PLATELETS: 149 10*3/uL — AB (ref 150–400)
RBC: 5.16 MIL/uL (ref 4.22–5.81)
RDW: 13.3 % (ref 11.5–15.5)
WBC: 15.5 10*3/uL — AB (ref 4.0–10.5)

## 2014-07-18 LAB — PROTIME-INR
INR: 1.07 (ref 0.00–1.49)
Prothrombin Time: 13.9 seconds (ref 11.6–15.2)

## 2014-07-18 LAB — LIPASE, BLOOD: Lipase: >3000 U/L — ABNORMAL HIGH (ref 11–59)

## 2014-07-18 MED ORDER — CHLORHEXIDINE GLUCONATE 0.12 % MT SOLN
15.0000 mL | Freq: Two times a day (BID) | OROMUCOSAL | Status: DC
  Administered 2014-07-18 – 2014-07-23 (×9): 15 mL via OROMUCOSAL
  Filled 2014-07-18 (×10): qty 15

## 2014-07-18 MED ORDER — DIPHENHYDRAMINE HCL 12.5 MG/5ML PO ELIX
12.5000 mg | ORAL_SOLUTION | Freq: Four times a day (QID) | ORAL | Status: DC | PRN
  Administered 2014-07-19: 25 mg via ORAL
  Filled 2014-07-18: qty 10

## 2014-07-18 MED ORDER — MORPHINE SULFATE 4 MG/ML IJ SOLN
4.0000 mg | INTRAMUSCULAR | Status: DC | PRN
  Administered 2014-07-18: 4 mg via INTRAVENOUS
  Filled 2014-07-18: qty 1

## 2014-07-18 MED ORDER — PANTOPRAZOLE SODIUM 40 MG IV SOLR
40.0000 mg | Freq: Every day | INTRAVENOUS | Status: DC
  Administered 2014-07-18 – 2014-07-22 (×5): 40 mg via INTRAVENOUS
  Filled 2014-07-18 (×8): qty 40

## 2014-07-18 MED ORDER — ONDANSETRON HCL 4 MG/2ML IJ SOLN
4.0000 mg | Freq: Four times a day (QID) | INTRAMUSCULAR | Status: DC | PRN
  Administered 2014-07-18 – 2014-07-19 (×4): 4 mg via INTRAVENOUS
  Filled 2014-07-18 (×4): qty 2

## 2014-07-18 MED ORDER — HYDROMORPHONE HCL PF 1 MG/ML IJ SOLN
1.0000 mg | INTRAMUSCULAR | Status: DC | PRN
  Administered 2014-07-18: 1 mg via INTRAVENOUS
  Filled 2014-07-18: qty 1

## 2014-07-18 MED ORDER — SODIUM CHLORIDE 0.9 % IV SOLN
Freq: Once | INTRAVENOUS | Status: AC
  Administered 2014-07-18: 13:00:00 via INTRAVENOUS

## 2014-07-18 MED ORDER — SODIUM CHLORIDE 0.9 % IV BOLUS (SEPSIS)
500.0000 mL | Freq: Once | INTRAVENOUS | Status: AC
  Administered 2014-07-18: 500 mL via INTRAVENOUS

## 2014-07-18 MED ORDER — CETYLPYRIDINIUM CHLORIDE 0.05 % MT LIQD
7.0000 mL | Freq: Two times a day (BID) | OROMUCOSAL | Status: DC
  Administered 2014-07-18 – 2014-07-21 (×6): 7 mL via OROMUCOSAL

## 2014-07-18 MED ORDER — DIPHENHYDRAMINE HCL 50 MG/ML IJ SOLN: 12.5000 mg | Freq: Four times a day (QID) | INTRAMUSCULAR | Status: DC | PRN

## 2014-07-18 MED ORDER — HYDROMORPHONE HCL PF 1 MG/ML IJ SOLN
1.0000 mg | INTRAMUSCULAR | Status: DC | PRN
  Administered 2014-07-18 – 2014-07-19 (×3): 2 mg via INTRAVENOUS
  Administered 2014-07-19: 1 mg via INTRAVENOUS
  Administered 2014-07-19 – 2014-07-20 (×5): 2 mg via INTRAVENOUS
  Administered 2014-07-21 (×2): 1 mg via INTRAVENOUS
  Administered 2014-07-21 (×2): 2 mg via INTRAVENOUS
  Administered 2014-07-22: 1 mg via INTRAVENOUS
  Filled 2014-07-18: qty 2
  Filled 2014-07-18: qty 1
  Filled 2014-07-18 (×7): qty 2
  Filled 2014-07-18: qty 1
  Filled 2014-07-18 (×3): qty 2
  Filled 2014-07-18: qty 1
  Filled 2014-07-18: qty 2

## 2014-07-18 MED ORDER — ONDANSETRON HCL 4 MG/2ML IJ SOLN
4.0000 mg | Freq: Once | INTRAMUSCULAR | Status: AC
  Administered 2014-07-18: 4 mg via INTRAVENOUS
  Filled 2014-07-18: qty 2

## 2014-07-18 MED ORDER — HEPARIN SODIUM (PORCINE) 5000 UNIT/ML IJ SOLN
5000.0000 [IU] | Freq: Three times a day (TID) | INTRAMUSCULAR | Status: DC
  Administered 2014-07-18 – 2014-07-23 (×15): 5000 [IU] via SUBCUTANEOUS
  Filled 2014-07-18 (×21): qty 1

## 2014-07-18 MED ORDER — KCL IN DEXTROSE-NACL 20-5-0.45 MEQ/L-%-% IV SOLN
INTRAVENOUS | Status: DC
  Administered 2014-07-18: 17:00:00 via INTRAVENOUS
  Administered 2014-07-19: 1 mL via INTRAVENOUS
  Administered 2014-07-19 – 2014-07-22 (×13): via INTRAVENOUS
  Filled 2014-07-18 (×21): qty 1000

## 2014-07-18 NOTE — ED Notes (Signed)
Pt placed on 2L nasal cannula, O2 saturation 91%.  Pt rebounded to 96% on 2L.

## 2014-07-18 NOTE — H&P (Addendum)
Evan Hamilton is an 48 y.o. male.   Chief Complaint: epigastric and LUQ pain Evan Hamilton was seen in the emergency department on August 21 and diagnosed with gallstones. He was given a referral to our office but has not been seen yet. The pain returned last night and was much more severe. This time it was in his epigastrium and left upper quadrant. He returned to the emergency department where he was found to have elevated lipase consistent with biliary pancreatitis. I was asked to see him for admission. His pain has improved significantly after receiving pain medication. He has recently lost over 40 pounds.  Past Medical History  Diagnosis Date  . Allergy   . Diabetes mellitus without complication     History reviewed. No pertinent past surgical history.  Family History  Problem Relation Age of Onset  . Cancer Mother     Lung  . Diabetes Mother   . Heart disease Father   . Hyperlipidemia Father   . Hypertension Father   . Mental illness Sister   . Cancer Maternal Grandmother     Lung  . Diabetes Maternal Grandmother    Social History:  reports that he has been smoking Cigars.  He does not have any smokeless tobacco history on file. He reports that he drinks about 1.5 ounces of alcohol per week. He reports that he does not use illicit drugs.  Allergies:  Allergies  Allergen Reactions  . Penicillins Hives and Rash     (Not in a hospital admission)  Results for orders placed during the hospital encounter of 07/18/14 (from the past 48 hour(s))  CBC WITH DIFFERENTIAL     Status: Abnormal   Collection Time    07/18/14 11:10 AM      Result Value Ref Range   WBC 15.5 (*) 4.0 - 10.5 K/uL   RBC 5.16  4.22 - 5.81 MIL/uL   Hemoglobin 15.8  13.0 - 17.0 g/dL   HCT 44.1  39.0 - 52.0 %   MCV 85.5  78.0 - 100.0 fL   MCH 30.6  26.0 - 34.0 pg   MCHC 35.8  30.0 - 36.0 g/dL   RDW 13.3  11.5 - 15.5 %   Platelets 149 (*) 150 - 400 K/uL   Neutrophils Relative % 85 (*) 43 - 77 %   Neutro  Abs 13.1 (*) 1.7 - 7.7 K/uL   Lymphocytes Relative 9 (*) 12 - 46 %   Lymphs Abs 1.5  0.7 - 4.0 K/uL   Monocytes Relative 5  3 - 12 %   Monocytes Absolute 0.8  0.1 - 1.0 K/uL   Eosinophils Relative 1  0 - 5 %   Eosinophils Absolute 0.1  0.0 - 0.7 K/uL   Basophils Relative 0  0 - 1 %   Basophils Absolute 0.0  0.0 - 0.1 K/uL  COMPREHENSIVE METABOLIC PANEL     Status: Abnormal   Collection Time    07/18/14 11:10 AM      Result Value Ref Range   Sodium 135 (*) 137 - 147 mEq/L   Potassium 3.9  3.7 - 5.3 mEq/L   Chloride 98  96 - 112 mEq/L   CO2 25  19 - 32 mEq/L   Glucose, Bld 160 (*) 70 - 99 mg/dL   BUN 11  6 - 23 mg/dL   Creatinine, Ser 0.82  0.50 - 1.35 mg/dL   Calcium 9.1  8.4 - 10.5 mg/dL   Total Protein 7.8  6.0 -  8.3 g/dL   Albumin 3.6  3.5 - 5.2 g/dL   AST 84 (*) 0 - 37 U/L   ALT 164 (*) 0 - 53 U/L   Alkaline Phosphatase 194 (*) 39 - 117 U/L   Total Bilirubin 1.7 (*) 0.3 - 1.2 mg/dL   GFR calc non Af Amer >90  >90 mL/min   GFR calc Af Amer >90  >90 mL/min   Comment: (NOTE)     The eGFR has been calculated using the CKD EPI equation.     This calculation has not been validated in all clinical situations.     eGFR's persistently <90 mL/min signify possible Chronic Kidney     Disease.   Anion gap 12  5 - 15  LIPASE, BLOOD     Status: Abnormal   Collection Time    07/18/14 11:10 AM      Result Value Ref Range   Lipase >3000 (*) 11 - 59 U/L  PROTIME-INR     Status: None   Collection Time    07/18/14 11:10 AM      Result Value Ref Range   Prothrombin Time 13.9  11.6 - 15.2 seconds   INR 1.07  0.00 - 1.49   No results found.  Review of Systems  Constitutional: Negative.   HENT: Negative.   Eyes: Negative.   Respiratory: Negative.   Cardiovascular: Negative.   Gastrointestinal: Positive for abdominal pain.  Genitourinary: Negative.   Musculoskeletal: Negative.   Skin: Negative.   Neurological: Negative.   Endo/Heme/Allergies: Negative.   Psychiatric/Behavioral:  Negative.     Blood pressure 124/66, pulse 73, resp. rate 19, SpO2 92.00%. Physical Exam  Constitutional: He is oriented to person, place, and time. He appears well-developed and well-nourished. No distress.  HENT:  Head: Normocephalic and atraumatic.  Right Ear: External ear normal.  Left Ear: External ear normal.  Nose: Nose normal.  Mouth/Throat: Oropharynx is clear and moist. No oropharyngeal exudate.  Eyes: Conjunctivae and EOM are normal. Pupils are equal, round, and reactive to light. Right eye exhibits no discharge. Left eye exhibits no discharge. No scleral icterus.  Neck: Normal range of motion. Neck supple. No tracheal deviation present.  Cardiovascular: Normal rate, normal heart sounds and intact distal pulses.   Respiratory: Effort normal and breath sounds normal. No stridor. No respiratory distress. He has no wheezes. He has no rales.  GI: Soft. Bowel sounds are normal. He exhibits no distension. There is no tenderness. There is no rebound and no guarding.  Musculoskeletal: Normal range of motion. He exhibits no edema and no tenderness.  Neurological: He is alert and oriented to person, place, and time. He exhibits normal muscle tone.  Skin: Skin is warm and dry.  Psychiatric: He has a normal mood and affect.     Assessment/Plan Biliary pancreatitis, mild transaminitis - admit, IV fluids, IV antibiotics. We will recheck labs in the a.m. If liver function tests are worse, he may need GI consultation with ERCP. Otherwise, we will plan to proceed with laparoscopic cholecystectomy once pancreatitis resolves. Plan was discussed in detail with  the patient.  Evan Hamilton E 07/18/2014, 1:31 PM

## 2014-07-18 NOTE — ED Notes (Signed)
Pt c/o of constant upper abdominal pain.  Pt has hx of gallstones.

## 2014-07-19 LAB — COMPREHENSIVE METABOLIC PANEL
ALBUMIN: 2.8 g/dL — AB (ref 3.5–5.2)
ALT: 105 U/L — AB (ref 0–53)
AST: 45 U/L — AB (ref 0–37)
Alkaline Phosphatase: 150 U/L — ABNORMAL HIGH (ref 39–117)
Anion gap: 11 (ref 5–15)
BILIRUBIN TOTAL: 1.1 mg/dL (ref 0.3–1.2)
BUN: 8 mg/dL (ref 6–23)
CO2: 24 mEq/L (ref 19–32)
Calcium: 8.8 mg/dL (ref 8.4–10.5)
Chloride: 101 mEq/L (ref 96–112)
Creatinine, Ser: 1.02 mg/dL (ref 0.50–1.35)
GFR calc Af Amer: >90 mL/min (ref >90)
GFR calc non Af Amer: 86 mL/min — ABNORMAL LOW (ref >90)
Glucose, Bld: 96 mg/dL (ref 70–99)
POTASSIUM: 4.5 meq/L (ref 3.7–5.3)
SODIUM: 136 meq/L — AB (ref 137–147)
TOTAL PROTEIN: 7 g/dL (ref 6.0–8.3)

## 2014-07-19 LAB — CBC
HEMATOCRIT: 42.3 % (ref 39.0–52.0)
HEMOGLOBIN: 14.1 g/dL (ref 13.0–17.0)
MCH: 29.8 pg (ref 26.0–34.0)
MCHC: 33.3 g/dL (ref 30.0–36.0)
MCV: 89.4 fL (ref 78.0–100.0)
Platelets: 140 10*3/uL — ABNORMAL LOW (ref 150–400)
RBC: 4.73 MIL/uL (ref 4.22–5.81)
RDW: 13.7 % (ref 11.5–15.5)
WBC: 16.9 10*3/uL — ABNORMAL HIGH (ref 4.0–10.5)

## 2014-07-19 LAB — LIPASE, BLOOD: Lipase: 1928 U/L — ABNORMAL HIGH (ref 11–59)

## 2014-07-19 LAB — AMYLASE: Amylase: 690 U/L — ABNORMAL HIGH (ref 0–105)

## 2014-07-19 NOTE — Progress Notes (Addendum)
  Subjective: Alert. Stable. Says nausea and vomiting and resolved. Somewhat anorexic. Pain is much better. Afebrile with stable vital signs.  Intake and output record is incomplete.   Lipase 1928, AST 45, ALT 105, bilirubin 1.1. Potassium 4.5. BUN 83 creatinine 1.02. Calcium 8.8. Hemoglobin 14.1. WBC is 16,900. Objective: Vital signs in last 24 hours: Temp:  [98.2 F (36.8 C)-98.8 F (37.1 C)] 98.2 F (36.8 C) (08/30 0556) Pulse Rate:  [58-77] 61 (08/30 0556) Resp:  [12-24] 18 (08/30 0556) BP: (103-141)/(56-77) 103/57 mmHg (08/30 0556) SpO2:  [92 %-100 %] 93 % (08/30 0556) Weight:  [353 lb (160.12 kg)] 353 lb (160.12 kg) (08/29 1629) Last BM Date: 07/17/14  Intake/Output from previous day: 08/29 0701 - 08/30 0700 In: 629 [I.V.:129; IV Piggyback:500] Out: -  Intake/Output this shift:    General appearance: alert. Cooperative. Appears to be in minimal distress. Mental status normal. Morbidly obese. GI: obese. Soft. Nondistended. Mild tenderness epigastric and right upper quadrant. No mass.  Lab Results:   Recent Labs  07/18/14 1110 07/19/14 0420  WBC 15.5* 16.9*  HGB 15.8 14.1  HCT 44.1 42.3  PLT 149* 140*   BMET  Recent Labs  07/18/14 1110 07/19/14 0420  NA 135* 136*  K 3.9 4.5  CL 98 101  CO2 25 24  GLUCOSE 160* 96  BUN 11 8  CREATININE 0.82 1.02  CALCIUM 9.1 8.8   PT/INR  Recent Labs  07/18/14 1110  LABPROT 13.9  INR 1.07   ABG No results found for this basename: PHART, PCO2, PO2, HCO3,  in the last 72 hours  Studies/Results: No results found.  Anti-infectives: Anti-infectives   None      Assessment/Plan:  Biliary pancreatitis and mild transaminitis. Stable and symptomatically improving Continue n.p.o. Except ice chips and meds Mobilized and related Hall Strict intake and output Labs tomorrow Cholecystectomy this admission when  pancreatitis resolved. Patient understands and agrees.   LOS: 1 day    Evan Hamilton  M 07/19/2014

## 2014-07-20 LAB — COMPREHENSIVE METABOLIC PANEL
ALT: 62 U/L — ABNORMAL HIGH (ref 0–53)
AST: 19 U/L (ref 0–37)
Albumin: 2.7 g/dL — ABNORMAL LOW (ref 3.5–5.2)
Alkaline Phosphatase: 134 U/L — ABNORMAL HIGH (ref 39–117)
Anion gap: 12 (ref 5–15)
BUN: 8 mg/dL (ref 6–23)
CO2: 23 meq/L (ref 19–32)
CREATININE: 0.9 mg/dL (ref 0.50–1.35)
Calcium: 8.7 mg/dL (ref 8.4–10.5)
Chloride: 103 mEq/L (ref 96–112)
GLUCOSE: 137 mg/dL — AB (ref 70–99)
Potassium: 4.3 mEq/L (ref 3.7–5.3)
Sodium: 138 mEq/L (ref 137–147)
Total Bilirubin: 1 mg/dL (ref 0.3–1.2)
Total Protein: 7.1 g/dL (ref 6.0–8.3)

## 2014-07-20 LAB — CBC
HCT: 43.1 % (ref 39.0–52.0)
Hemoglobin: 14.2 g/dL (ref 13.0–17.0)
MCH: 29.1 pg (ref 26.0–34.0)
MCHC: 32.9 g/dL (ref 30.0–36.0)
MCV: 88.3 fL (ref 78.0–100.0)
Platelets: 147 10*3/uL — ABNORMAL LOW (ref 150–400)
RBC: 4.88 MIL/uL (ref 4.22–5.81)
RDW: 13.8 % (ref 11.5–15.5)
WBC: 12.4 10*3/uL — ABNORMAL HIGH (ref 4.0–10.5)

## 2014-07-20 LAB — LIPASE, BLOOD: LIPASE: 280 U/L — AB (ref 11–59)

## 2014-07-20 LAB — SURGICAL PCR SCREEN
MRSA, PCR: NEGATIVE
STAPHYLOCOCCUS AUREUS: NEGATIVE

## 2014-07-20 NOTE — Progress Notes (Signed)
  Subjective: Passing flatus but no stool. Nausea has resolved.Pain is well controlled by narcotic, but when the analgesia and wears off he says his pain is still as 7/10, midepigastrium. Also complains of back pain and headache from time to time. Clinically stable.No Fever or tachycardia.Adequate urine output. He is frustrated. He was to go ahead with the surgery today. Lab work was just drawn. No results yet.   Objective: Vital signs in last 24 hours: Temp:  [98.1 F (36.7 C)-99.6 F (37.6 C)] 98.1 F (36.7 C) (08/31 0543) Pulse Rate:  [74-90] 77 (08/31 0543) Resp:  [19-20] 19 (08/31 0543) BP: (108-115)/(57-73) 108/57 mmHg (08/31 0543) SpO2:  [93 %-96 %] 95 % (08/31 0543) Last BM Date: 07/17/14  Intake/Output from previous day: 08/30 0701 - 08/31 0700 In: 4495.4 [I.V.:4495.4] Out: 800 [Urine:800] Intake/Output this shift:    General appearance: alert. Cooperative. Mental status normal. Appears  frustrated by being in the hospital and having to wait. GI: morbidly obese. Soft. Not obviously distended. Subjectively tender in epigastrium and right upper quadrant but no guarding or mass. Difficult exam due to high BMI.  Lab Results:   Recent Labs  07/18/14 1110 07/19/14 0420  WBC 15.5* 16.9*  HGB 15.8 14.1  HCT 44.1 42.3  PLT 149* 140*   BMET  Recent Labs  07/18/14 1110 07/19/14 0420  NA 135* 136*  K 3.9 4.5  CL 98 101  CO2 25 24  GLUCOSE 160* 96  BUN 11 8  CREATININE 0.82 1.02  CALCIUM 9.1 8.8   PT/INR  Recent Labs  07/18/14 1110  LABPROT 13.9  INR 1.07   ABG No results found for this basename: PHART, PCO2, PO2, HCO3,  in the last 72 hours  Studies/Results: No results found.  Anti-infectives: Anti-infectives   None      Assessment/Plan:  Biliary pancreatitis and mild transaminitis. Stable and symptomatically improving  Continue n.p.o. Except ice chips and meds  Mobilize in hall Strict intake and output  Labs today and  tomorrow Cholecystectomy with IOC this admission when pancreatitis resolved.Maybe  tomorrow. Patient understands and agrees.  VTE prophylaxis. On [place] heparin.  Morbid obesity  Penicillin allergy. Hives and rash.    LOS: 2 days    Evan Hamilton M 07/20/2014

## 2014-07-21 LAB — CBC
HCT: 41.6 % (ref 39.0–52.0)
Hemoglobin: 13.6 g/dL (ref 13.0–17.0)
MCH: 29.1 pg (ref 26.0–34.0)
MCHC: 32.7 g/dL (ref 30.0–36.0)
MCV: 88.9 fL (ref 78.0–100.0)
Platelets: 155 10*3/uL (ref 150–400)
RBC: 4.68 MIL/uL (ref 4.22–5.81)
RDW: 13.6 % (ref 11.5–15.5)
WBC: 11.1 10*3/uL — ABNORMAL HIGH (ref 4.0–10.5)

## 2014-07-21 LAB — COMPREHENSIVE METABOLIC PANEL
ALBUMIN: 2.7 g/dL — AB (ref 3.5–5.2)
ALT: 46 U/L (ref 0–53)
ANION GAP: 10 (ref 5–15)
AST: 16 U/L (ref 0–37)
Alkaline Phosphatase: 109 U/L (ref 39–117)
BUN: 9 mg/dL (ref 6–23)
CO2: 28 mEq/L (ref 19–32)
Calcium: 9 mg/dL (ref 8.4–10.5)
Chloride: 102 mEq/L (ref 96–112)
Creatinine, Ser: 1 mg/dL (ref 0.50–1.35)
GFR calc Af Amer: >90 mL/min (ref >90)
GFR calc non Af Amer: 89 mL/min — ABNORMAL LOW (ref >90)
Glucose, Bld: 138 mg/dL — ABNORMAL HIGH (ref 70–99)
Potassium: 5.2 mEq/L (ref 3.7–5.3)
Sodium: 140 mEq/L (ref 137–147)
TOTAL PROTEIN: 7.1 g/dL (ref 6.0–8.3)
Total Bilirubin: 0.9 mg/dL (ref 0.3–1.2)

## 2014-07-21 LAB — LIPASE, BLOOD: Lipase: 206 U/L — ABNORMAL HIGH (ref 11–59)

## 2014-07-21 NOTE — Progress Notes (Signed)
  Subjective: Pt doing well.  Still with some abd pain in mid epigastrum  Objective: Vital signs in last 24 hours: Temp:  [98.3 F (36.8 C)-99.1 F (37.3 C)] 98.5 F (36.9 C) (09/01 0504) Pulse Rate:  [73-84] 75 (09/01 0504) Resp:  [16-20] 16 (09/01 0504) BP: (99-121)/(40-64) 121/64 mmHg (09/01 0504) SpO2:  [93 %-100 %] 99 % (09/01 0504) Last BM Date: 07/17/14  Intake/Output from previous day: 08/31 0701 - 09/01 0700 In: 3587.5 [I.V.:3587.5] Out: 350 [Urine:350] Intake/Output this shift:    General appearance: alert and cooperative Abd: s/nd/ttp in midepigastrum, no rebound, guarding  Lab Results:   Recent Labs  07/20/14 0714 07/21/14 0442  WBC 12.4* 11.1*  HGB 14.2 13.6  HCT 43.1 41.6  PLT 147* 155   BMET  Recent Labs  07/20/14 0714 07/21/14 0442  NA 138 140  K 4.3 5.2  CL 103 102  CO2 23 28  GLUCOSE 137* 138*  BUN 8 9  CREATININE 0.90 1.00  CALCIUM 8.7 9.0   PT/INR  Recent Labs  07/18/14 1110  LABPROT 13.9  INR 1.07   ABG No results found for this basename: PHART, PCO2, PO2, HCO3,  in the last 72 hours  Studies/Results: No results found.  Anti-infectives: Anti-infectives   None      Assessment/Plan: 48 y/o M with Biliary pancreatitis Pt with con't abd pain and elev lipase. Con't NPO for now. Hope for OR in AM  LOS: 3 days    Marigene Ehlers., [facility] 07/21/2014

## 2014-07-21 NOTE — Progress Notes (Signed)
Patient ID: Evan Hamilton, male   DOB: 14-Oct-1966, 48 y.o.   MRN: [id number]    Subjective: Pt still having some pain but better with meds  Objective: Vital signs in last 24 hours: Temp:  [98.3 F (36.8 C)-99.1 F (37.3 C)] 98.5 F (36.9 C) (09/01 0504) Pulse Rate:  [73-84] 75 (09/01 0504) Resp:  [16-20] 16 (09/01 0504) BP: (99-121)/(40-64) 121/64 mmHg (09/01 0504) SpO2:  [93 %-100 %] 99 % (09/01 0504) Last BM Date: 07/17/14  Intake/Output from previous day: 08/31 0701 - 09/01 0700 In: 3587.5 [I.V.:3587.5] Out: 350 [Urine:350] Intake/Output this shift:    PE: Abd: soft, mildly tender in LUQ, +BS, ND Heart: regular Lungs: CTAB  Lab Results:   Recent Labs  07/20/14 0714 07/21/14 0442  WBC 12.4* 11.1*  HGB 14.2 13.6  HCT 43.1 41.6  PLT 147* 155   BMET  Recent Labs  07/20/14 0714 07/21/14 0442  NA 138 140  K 4.3 5.2  CL 103 102  CO2 23 28  GLUCOSE 137* 138*  BUN 8 9  CREATININE 0.90 1.00  CALCIUM 8.7 9.0   PT/INR  Recent Labs  07/18/14 1110  LABPROT 13.9  INR 1.07   CMP     Component Value Date/Time   NA 140 07/21/2014 0442   K 5.2 07/21/2014 0442   CL 102 07/21/2014 0442   CO2 28 07/21/2014 0442   GLUCOSE 138* 07/21/2014 0442   BUN 9 07/21/2014 0442   CREATININE 1.00 07/21/2014 0442   CREATININE 1.02 10/09/2013 1853   CALCIUM 9.0 07/21/2014 0442   PROT 7.1 07/21/2014 0442   ALBUMIN 2.7* 07/21/2014 0442   AST 16 07/21/2014 0442   ALT 46 07/21/2014 0442   ALKPHOS 109 07/21/2014 0442   BILITOT 0.9 07/21/2014 0442   GFRNONAA 89* 07/21/2014 0442   GFRAA >90 07/21/2014 0442   Lipase     Component Value Date/Time   LIPASE 206* 07/21/2014 0442       Studies/Results: No results found.  Anti-infectives: Anti-infectives   None       Assessment/Plan  1. Gallstone pancreatitis  Plan: 1. Await pancreatitis to continue to improve.  Hopefully, OR tomorrow.  Recheck labs in the am.  May have few sips of clear liquids.   LOS: 3 days    Alexzandrea Normington E 07/21/2014,  9:03 AM Pager: [phone]

## 2014-07-22 ENCOUNTER — Encounter (HOSPITAL_COMMUNITY): Payer: BC Managed Care – PPO | Admitting: Certified Registered Nurse Anesthetist

## 2014-07-22 ENCOUNTER — Inpatient Hospital Stay (HOSPITAL_COMMUNITY): Payer: BC Managed Care – PPO

## 2014-07-22 ENCOUNTER — Encounter (HOSPITAL_COMMUNITY): Payer: Self-pay | Admitting: Anesthesiology

## 2014-07-22 ENCOUNTER — Encounter (HOSPITAL_COMMUNITY)
Admission: EM | Disposition: A | Discharge status: Home / Self Care | Payer: BC Managed Care – PPO | Source: Home / Self Care

## 2014-07-22 ENCOUNTER — Inpatient Hospital Stay (HOSPITAL_COMMUNITY): Payer: BC Managed Care – PPO | Admitting: Certified Registered Nurse Anesthetist

## 2014-07-22 HISTORY — PX: CHOLECYSTECTOMY: SHX55

## 2014-07-22 LAB — GLUCOSE, CAPILLARY
GLUCOSE-CAPILLARY: 151 mg/dL — AB (ref 70–99)
Glucose-Capillary: 149 mg/dL — ABNORMAL HIGH (ref 70–99)

## 2014-07-22 LAB — COMPREHENSIVE METABOLIC PANEL
ALT: 35 U/L (ref 0–53)
AST: 16 U/L (ref 0–37)
Albumin: 2.6 g/dL — ABNORMAL LOW (ref 3.5–5.2)
Alkaline Phosphatase: 96 U/L (ref 39–117)
Anion gap: 9 (ref 5–15)
BUN: 8 mg/dL (ref 6–23)
CO2: 28 meq/L (ref 19–32)
CREATININE: 1 mg/dL (ref 0.50–1.35)
Calcium: 8.9 mg/dL (ref 8.4–10.5)
Chloride: 100 mEq/L (ref 96–112)
GFR, EST NON AFRICAN AMERICAN: 89 mL/min — AB (ref >90)
Glucose, Bld: 131 mg/dL — ABNORMAL HIGH (ref 70–99)
Potassium: 4.7 mEq/L (ref 3.7–5.3)
Sodium: 137 mEq/L (ref 137–147)
Total Bilirubin: 0.8 mg/dL (ref 0.3–1.2)
Total Protein: 6.9 g/dL (ref 6.0–8.3)

## 2014-07-22 LAB — LIPASE, BLOOD: LIPASE: 197 U/L — AB (ref 11–59)

## 2014-07-22 SURGERY — LAPAROSCOPIC CHOLECYSTECTOMY WITH INTRAOPERATIVE CHOLANGIOGRAM
Anesthesia: General | Site: Abdomen

## 2014-07-22 MED ORDER — BUPIVACAINE-EPINEPHRINE (PF) 0.25% -1:200000 IJ SOLN
INTRAMUSCULAR | Status: AC
  Filled 2014-07-22: qty 30

## 2014-07-22 MED ORDER — FENTANYL CITRATE 0.05 MG/ML IJ SOLN
INTRAMUSCULAR | Status: AC
  Filled 2014-07-22: qty 5

## 2014-07-22 MED ORDER — HEMOSTATIC AGENTS (NO CHARGE) OPTIME
TOPICAL | Status: DC | PRN
  Administered 2014-07-22: 1 via TOPICAL

## 2014-07-22 MED ORDER — BUPIVACAINE-EPINEPHRINE 0.25% -1:200000 IJ SOLN
INTRAMUSCULAR | Status: DC | PRN
Start: 2014-07-22 — End: 2014-07-22
  Administered 2014-07-22: 30 mL

## 2014-07-22 MED ORDER — MIDAZOLAM HCL 5 MG/5ML IJ SOLN
INTRAMUSCULAR | Status: DC | PRN
  Administered 2014-07-22: 2 mg via INTRAVENOUS

## 2014-07-22 MED ORDER — IOHEXOL 300 MG/ML  SOLN
INTRAMUSCULAR | Status: DC | PRN
  Administered 2014-07-22: 10:00:00

## 2014-07-22 MED ORDER — 0.9 % SODIUM CHLORIDE (POUR BTL) OPTIME
TOPICAL | Status: DC | PRN
  Administered 2014-07-22: 1000 mL

## 2014-07-22 MED ORDER — NEOSTIGMINE METHYLSULFATE 10 MG/10ML IV SOLN
INTRAVENOUS | Status: DC | PRN
  Administered 2014-07-22: 5 mg via INTRAVENOUS

## 2014-07-22 MED ORDER — SODIUM CHLORIDE 0.9 % IR SOLN
Status: DC | PRN
  Administered 2014-07-22 (×2): 1000 mL

## 2014-07-22 MED ORDER — PROPOFOL 10 MG/ML IV BOLUS
INTRAVENOUS | Status: DC | PRN
  Administered 2014-07-22: 150 mg via INTRAVENOUS
  Administered 2014-07-22: 200 mg via INTRAVENOUS

## 2014-07-22 MED ORDER — ONDANSETRON HCL 4 MG/2ML IJ SOLN: 4.0000 mg | Freq: Once | INTRAMUSCULAR | Status: DC | PRN

## 2014-07-22 MED ORDER — GLYCOPYRROLATE 0.2 MG/ML IJ SOLN
INTRAMUSCULAR | Status: DC | PRN
  Administered 2014-07-22: .8 mg via INTRAVENOUS

## 2014-07-22 MED ORDER — EPHEDRINE SULFATE 50 MG/ML IJ SOLN
INTRAMUSCULAR | Status: AC
  Filled 2014-07-22: qty 2

## 2014-07-22 MED ORDER — ONDANSETRON HCL 4 MG/2ML IJ SOLN
INTRAMUSCULAR | Status: DC | PRN
  Administered 2014-07-22: 4 mg via INTRAVENOUS

## 2014-07-22 MED ORDER — LIDOCAINE HCL 4 % MT SOLN
OROMUCOSAL | Status: DC | PRN
  Administered 2014-07-22: 4 mL via TOPICAL

## 2014-07-22 MED ORDER — HYDROMORPHONE HCL PF 1 MG/ML IJ SOLN
INTRAMUSCULAR | Status: AC
  Filled 2014-07-22: qty 1

## 2014-07-22 MED ORDER — GLYCOPYRROLATE 0.2 MG/ML IJ SOLN
INTRAMUSCULAR | Status: AC
  Filled 2014-07-22: qty 4

## 2014-07-22 MED ORDER — LIDOCAINE HCL (CARDIAC) 20 MG/ML IV SOLN
INTRAVENOUS | Status: DC | PRN
  Administered 2014-07-22: 80 mg via INTRAVENOUS

## 2014-07-22 MED ORDER — CIPROFLOXACIN IN D5W 400 MG/200ML IV SOLN
400.0000 mg | INTRAVENOUS | Status: AC
  Administered 2014-07-22: 400 mg via INTRAVENOUS
  Filled 2014-07-22: qty 200

## 2014-07-22 MED ORDER — OXYCODONE-ACETAMINOPHEN 5-325 MG PO TABS
1.0000 | ORAL_TABLET | ORAL | Status: DC | PRN
  Administered 2014-07-22 – 2014-07-23 (×3): 2 via ORAL
  Filled 2014-07-22 (×3): qty 2

## 2014-07-22 MED ORDER — CIPROFLOXACIN IN D5W 400 MG/200ML IV SOLN
400.0000 mg | INTRAVENOUS | Status: DC
Start: 2014-07-23 — End: 2014-07-23
  Filled 2014-07-22: qty 200

## 2014-07-22 MED ORDER — SUCCINYLCHOLINE CHLORIDE 20 MG/ML IJ SOLN
INTRAMUSCULAR | Status: DC | PRN
  Administered 2014-07-22: 160 mg via INTRAVENOUS

## 2014-07-22 MED ORDER — EPHEDRINE SULFATE 50 MG/ML IJ SOLN
INTRAMUSCULAR | Status: DC | PRN
  Administered 2014-07-22: 10 mg via INTRAVENOUS

## 2014-07-22 MED ORDER — MIDAZOLAM HCL 2 MG/2ML IJ SOLN
INTRAMUSCULAR | Status: AC
  Filled 2014-07-22: qty 2

## 2014-07-22 MED ORDER — STERILE WATER FOR INJECTION IJ SOLN
INTRAMUSCULAR | Status: AC
  Filled 2014-07-22: qty 20

## 2014-07-22 MED ORDER — HYDROMORPHONE HCL PF 1 MG/ML IJ SOLN
0.2500 mg | INTRAMUSCULAR | Status: DC | PRN
  Administered 2014-07-22 (×4): 0.5 mg via INTRAVENOUS

## 2014-07-22 MED ORDER — LIDOCAINE HCL (CARDIAC) 20 MG/ML IV SOLN
INTRAVENOUS | Status: AC
  Filled 2014-07-22: qty 5

## 2014-07-22 MED ORDER — ARTIFICIAL TEARS OP OINT
TOPICAL_OINTMENT | OPHTHALMIC | Status: DC | PRN
  Administered 2014-07-22: 1 via OPHTHALMIC

## 2014-07-22 MED ORDER — PHENYLEPHRINE HCL 10 MG/ML IJ SOLN
INTRAMUSCULAR | Status: DC | PRN
  Administered 2014-07-22: 80 ug via INTRAVENOUS

## 2014-07-22 MED ORDER — LACTATED RINGERS IV SOLN
INTRAVENOUS | Status: DC
  Administered 2014-07-22: 50 mL/h via INTRAVENOUS

## 2014-07-22 MED ORDER — LACTATED RINGERS IV SOLN
INTRAVENOUS | Status: DC | PRN
  Administered 2014-07-22 (×2): via INTRAVENOUS

## 2014-07-22 MED ORDER — ONDANSETRON HCL 4 MG/2ML IJ SOLN
INTRAMUSCULAR | Status: AC
  Filled 2014-07-22: qty 2

## 2014-07-22 MED ORDER — FENTANYL CITRATE 0.05 MG/ML IJ SOLN
INTRAMUSCULAR | Status: DC | PRN
  Administered 2014-07-22: 50 ug via INTRAVENOUS
  Administered 2014-07-22: 200 ug via INTRAVENOUS
  Administered 2014-07-22: 100 ug via INTRAVENOUS
  Administered 2014-07-22: 50 ug via INTRAVENOUS

## 2014-07-22 MED ORDER — ROCURONIUM BROMIDE 100 MG/10ML IV SOLN
INTRAVENOUS | Status: DC | PRN
  Administered 2014-07-22: 40 mg via INTRAVENOUS
  Administered 2014-07-22: 10 mg via INTRAVENOUS

## 2014-07-22 MED ORDER — NEOSTIGMINE METHYLSULFATE 10 MG/10ML IV SOLN
INTRAVENOUS | Status: AC
  Filled 2014-07-22: qty 1

## 2014-07-22 SURGICAL SUPPLY — 48 items
APPLIER CLIP ROT 10 11.4 M/L (STAPLE) ×2
BLADE SURG ROTATE 9660 (MISCELLANEOUS) ×2 IMPLANT
CANISTER SUCTION 2500CC (MISCELLANEOUS) ×2 IMPLANT
CHLORAPREP W/TINT 26ML (MISCELLANEOUS) ×2 IMPLANT
CLIP APPLIE ROT 10 11.4 M/L (STAPLE) ×1 IMPLANT
COVER MAYO STAND STRL (DRAPES) ×2 IMPLANT
COVER SURGICAL LIGHT HANDLE (MISCELLANEOUS) ×2 IMPLANT
DECANTER SPIKE VIAL GLASS SM (MISCELLANEOUS) ×4 IMPLANT
DERMABOND ADVANCED (GAUZE/BANDAGES/DRESSINGS) ×1
DERMABOND ADVANCED .7 DNX12 (GAUZE/BANDAGES/DRESSINGS) ×1 IMPLANT
DEVICE TROCAR PUNCTURE CLOSURE (ENDOMECHANICALS) ×2 IMPLANT
DRAPE C-ARM 42X72 X-RAY (DRAPES) ×2 IMPLANT
DRAPE UTILITY 15X26 W/TAPE STR (DRAPE) ×4 IMPLANT
DRAPE WARM FLUID 44X44 (DRAPE) ×2 IMPLANT
ELECT REM PT RETURN 9FT ADLT (ELECTROSURGICAL) ×2
ELECTRODE REM PT RTRN 9FT ADLT (ELECTROSURGICAL) ×1 IMPLANT
GLOVE BIO SURGEON STRL SZ7 (GLOVE) ×2 IMPLANT
GLOVE BIO SURGEON STRL SZ8 (GLOVE) ×2 IMPLANT
GLOVE BIOGEL PI IND STRL 7.0 (GLOVE) ×1 IMPLANT
GLOVE BIOGEL PI IND STRL 7.5 (GLOVE) ×2 IMPLANT
GLOVE BIOGEL PI IND STRL 8 (GLOVE) ×1 IMPLANT
GLOVE BIOGEL PI INDICATOR 7.0 (GLOVE) ×1
GLOVE BIOGEL PI INDICATOR 7.5 (GLOVE) ×2
GLOVE BIOGEL PI INDICATOR 8 (GLOVE) ×1
GLOVE ECLIPSE 7.5 STRL STRAW (GLOVE) ×2 IMPLANT
GOWN STRL REUS W/ TWL LRG LVL3 (GOWN DISPOSABLE) ×4 IMPLANT
GOWN STRL REUS W/ TWL XL LVL3 (GOWN DISPOSABLE) ×1 IMPLANT
GOWN STRL REUS W/TWL LRG LVL3 (GOWN DISPOSABLE) ×4
GOWN STRL REUS W/TWL XL LVL3 (GOWN DISPOSABLE) ×1
HEMOSTAT SNOW SURGICEL 2X4 (HEMOSTASIS) ×2 IMPLANT
KIT BASIN OR (CUSTOM PROCEDURE TRAY) ×2 IMPLANT
KIT ROOM TURNOVER OR (KITS) ×2 IMPLANT
NS IRRIG 1000ML POUR BTL (IV SOLUTION) ×4 IMPLANT
PAD ARMBOARD 7.5X6 YLW CONV (MISCELLANEOUS) ×2 IMPLANT
POUCH SPECIMEN RETRIEVAL 10MM (ENDOMECHANICALS) ×2 IMPLANT
SCISSORS LAP 5X35 DISP (ENDOMECHANICALS) ×2 IMPLANT
SET CHOLANGIOGRAPH 5 50 .035 (SET/KITS/TRAYS/PACK) ×2 IMPLANT
SET IRRIG TUBING LAPAROSCOPIC (IRRIGATION / IRRIGATOR) ×2 IMPLANT
SLEEVE ENDOPATH XCEL 5M (ENDOMECHANICALS) ×2 IMPLANT
SPECIMEN JAR SMALL (MISCELLANEOUS) ×2 IMPLANT
SUT MNCRL AB 4-0 PS2 18 (SUTURE) ×2 IMPLANT
SUT VICRYL 0 UR6 27IN ABS (SUTURE) ×2 IMPLANT
TOWEL OR 17X24 6PK STRL BLUE (TOWEL DISPOSABLE) ×2 IMPLANT
TOWEL OR 17X26 10 PK STRL BLUE (TOWEL DISPOSABLE) ×2 IMPLANT
TRAY LAPAROSCOPIC (CUSTOM PROCEDURE TRAY) ×2 IMPLANT
TROCAR XCEL BLUNT TIP 100MML (ENDOMECHANICALS) ×2 IMPLANT
TROCAR XCEL NON-BLD 11X100MML (ENDOMECHANICALS) ×2 IMPLANT
TROCAR XCEL NON-BLD 5MMX100MML (ENDOMECHANICALS) ×2 IMPLANT

## 2014-07-22 NOTE — Interval H&P Note (Signed)
History and Physical Interval Note:  07/22/2014 9:22 AM  Evan Hamilton  has presented today for surgery, with the diagnosis of Gallstones  The various methods of treatment have been discussed with the patient and family. After consideration of risks, benefits and other options for treatment, the patient has consented to  Procedure(s): LAPAROSCOPIC CHOLECYSTECTOMY WITH INTRAOPERATIVE CHOLANGIOGRAM (N/A) as a surgical intervention .  The patient's history has been reviewed, patient examined, no change in status, stable for surgery.  I have reviewed the patient's chart and labs.  Questions were answered to the patient's satisfaction.     Tysha Grismore A.

## 2014-07-22 NOTE — ED Provider Notes (Signed)
CSN: [id number]     Arrival date & time 07/18/14  1043 History   First MD Initiated Contact with Patient 07/18/14 1048     Chief Complaint  Patient presents with  . Abdominal Pain      HPI    Past Medical History  Diagnosis Date  . Allergy   . Diabetes mellitus without complication    History reviewed. No pertinent past surgical history. Family History  Problem Relation Age of Onset  . Cancer Mother     Lung  . Diabetes Mother   . Heart disease Father   . Hyperlipidemia Father   . Hypertension Father   . Mental illness Sister   . Cancer Maternal Grandmother     Lung  . Diabetes Maternal Grandmother    History  Substance Use Topics  . Smoking status: Current Some Day Smoker    Types: Cigars  . Smokeless tobacco: Not on file  . Alcohol Use: 1.5 oz/week    3 drink(s) per week    Review of Systems  Constitutional: Negative for fever, chills, diaphoresis, appetite change and fatigue.  HENT: Negative for mouth sores, sore throat and trouble swallowing.   Eyes: Negative for visual disturbance.  Respiratory: Negative for cough, chest tightness, shortness of breath and wheezing.   Cardiovascular: Negative for chest pain.  Gastrointestinal: Positive for nausea, vomiting and abdominal pain. Negative for diarrhea and abdominal distention.  Endocrine: Negative for polydipsia, polyphagia and polyuria.  Genitourinary: Negative for dysuria, frequency and hematuria.  Musculoskeletal: Negative for gait problem.  Skin: Negative for color change, pallor and rash.  Neurological: Negative for dizziness, syncope, light-headedness and headaches.  Hematological: Does not bruise/bleed easily.  Psychiatric/Behavioral: Negative for behavioral problems and confusion.      Allergies  Penicillins  Home Medications   Prior to Admission medications   Medication Sig Start Date End Date Taking? Authorizing Provider  Cyanocobalamin (VITAMIN B-12 PO) Take 1 tablet by mouth daily.   Yes  Historical Provider, MD  esomeprazole (NEXIUM) 40 MG capsule Take 40 mg by mouth daily as needed (for heartburn).   Yes Historical Provider, MD  Ginkgo Biloba (GINKOBA PO) Take 1 tablet by mouth daily.   Yes Historical Provider, MD  Multiple Vitamin (MULTIVITAMIN) tablet Take 1 tablet by mouth daily.   Yes Historical Provider, MD  omega-3 acid ethyl esters (LOVAZA) 1 G capsule Take 1 g by mouth daily.   Yes Historical Provider, MD  oxyCODONE-acetaminophen (PERCOCET/ROXICET) 5-325 MG per tablet Take 1-2 tablets by mouth every 4 (four) hours as needed for moderate pain. 07/10/14  Yes Rolan Bucco, MD  POTASSIUM GLUCONATE PO Take 1 tablet by mouth daily.   Yes Historical Provider, MD  Probiotic Product (PROBIOTIC PO) Take 1 tablet by mouth daily.   Yes Historical Provider, MD   BP 119/69  Pulse 77  Temp(Src) 98.6 F (37 C) (Oral)  Resp 14  Ht  (1.88 m)  Wt 353 lb (160.12 kg)  BMI 45.30 kg/m2  SpO2 96% Physical Exam  Constitutional: He is oriented to person, place, and time. He appears well-developed and well-nourished. No distress.  HENT:  Head: Normocephalic.  Eyes: Conjunctivae are normal. Pupils are equal, round, and reactive to light. No scleral icterus.  Neck: Normal range of motion. Neck supple. No thyromegaly present.  Cardiovascular: Normal rate and regular rhythm.  Exam reveals no gallop and no friction rub.   No murmur heard. Pulmonary/Chest: Effort normal and breath sounds normal. No respiratory distress. He has no  wheezes. He has no rales.  Abdominal: Soft. Bowel sounds are normal. He exhibits no distension. There is no tenderness. There is no rebound.    Diffuse upper abdominal tenderness. No peritoneal irritation.  Musculoskeletal: Normal range of motion.  Neurological: He is alert and oriented to person, place, and time.  Skin: Skin is warm and dry. No rash noted.  Psychiatric: He has a normal mood and affect. His behavior is normal.    ED Course  Procedures  (including critical care time) Labs Review Labs Reviewed  CBC WITH DIFFERENTIAL - Abnormal; Notable for the following:    WBC 15.5 (*)    Platelets 149 (*)    Neutrophils Relative % 85 (*)    Neutro Abs 13.1 (*)    Lymphocytes Relative 9 (*)    All other components within normal limits  COMPREHENSIVE METABOLIC PANEL - Abnormal; Notable for the following:    Sodium 135 (*)    Glucose, Bld 160 (*)    AST 84 (*)    ALT 164 (*)    Alkaline Phosphatase 194 (*)    Total Bilirubin 1.7 (*)    All other components within normal limits  LIPASE, BLOOD - Abnormal; Notable for the following:    Lipase >3000 (*)    All other components within normal limits  COMPREHENSIVE METABOLIC PANEL - Abnormal; Notable for the following:    Sodium 136 (*)    Albumin 2.8 (*)    AST 45 (*)    ALT 105 (*)    Alkaline Phosphatase 150 (*)    GFR calc non Af Amer 86 (*)    All other components within normal limits  CBC - Abnormal; Notable for the following:    WBC 16.9 (*)    Platelets 140 (*)    All other components within normal limits  AMYLASE - Abnormal; Notable for the following:    Amylase 690 (*)    All other components within normal limits  LIPASE, BLOOD - Abnormal; Notable for the following:    Lipase 1928 (*)    All other components within normal limits  COMPREHENSIVE METABOLIC PANEL - Abnormal; Notable for the following:    Glucose, Bld 137 (*)    Albumin 2.7 (*)    ALT 62 (*)    Alkaline Phosphatase 134 (*)    All other components within normal limits  LIPASE, BLOOD - Abnormal; Notable for the following:    Lipase 280 (*)    All other components within normal limits  CBC - Abnormal; Notable for the following:    WBC 12.4 (*)    Platelets 147 (*)    All other components within normal limits  CBC - Abnormal; Notable for the following:    WBC 11.1 (*)    All other components within normal limits  COMPREHENSIVE METABOLIC PANEL - Abnormal; Notable for the following:    Glucose, Bld 138  (*)    Albumin 2.7 (*)    GFR calc non Af Amer 89 (*)    All other components within normal limits  LIPASE, BLOOD - Abnormal; Notable for the following:    Lipase 206 (*)    All other components within normal limits  LIPASE, BLOOD - Abnormal; Notable for the following:    Lipase 197 (*)    All other components within normal limits  COMPREHENSIVE METABOLIC PANEL - Abnormal; Notable for the following:    Glucose, Bld 131 (*)    Albumin 2.6 (*)  GFR calc non Af Amer 89 (*)    All other components within normal limits  GLUCOSE, CAPILLARY - Abnormal; Notable for the following:    Glucose-Capillary 151 (*)    All other components within normal limits  GLUCOSE, CAPILLARY - Abnormal; Notable for the following:    Glucose-Capillary 149 (*)    All other components within normal limits  SURGICAL PCR SCREEN  PROTIME-INR    Imaging Review Dg Cholangiogram Operative  07/22/2014   CLINICAL DATA:  Laparoscopic cholecystectomy with intraoperative cholangiogram to evaluate for gallstones.  EXAM: INTRAOPERATIVE CHOLANGIOGRAM  FLUOROSCOPY TIME:  7 seconds  COMPARISON:  Abdominal ultrasound - 07/10/2014  FINDINGS: Intraoperative angiographic images of the right upper abdominal quadrant during laparoscopic cholecystectomy are provided for review.  Surgical clips overlie the expected location of the gallbladder fossa.  Contrast injection demonstrates selective cannulation of the central aspect of the cystic duct.  There is passage of contrast through the central aspect of the cystic duct with filling of a non dilated common bile duct. There is passage of contrast though the CBD and into the descending portion of the duodenum.  There is minimal reflux of injected contrast into the common hepatic duct and central aspect of the non dilated intrahepatic biliary system. There is minimal opacification the central aspect of the pancreatic duct.  There are no discrete filling defects within the opacified portions of  the biliary system to suggest the presence of choledocholithiasis.  IMPRESSION: No evidence of choledocholithiasis.   Electronically Signed   By: Simonne Come M.D.   On: 07/22/2014 15:39     EKG Interpretation None      MDM   Final diagnoses:  Cholecystitis    Patient seen and evaluated. Continued tenderness in his upper abdomen. Lipase elevated. Biliary enzymes elevated. I discussed the case with general surgery. Dr. Violeta Gelinas, on call. Patient to be admitted for gallstone pancreatitis.    Rolland Porter, MD 07/22/14 1859

## 2014-07-22 NOTE — Progress Notes (Signed)
Day of Surgery  Subjective: Pt with less pain.    Objective: Vital signs in last 24 hours: Temp:  [98.1 F (36.7 C)-99.1 F (37.3 C)] 98.3 F (36.8 C) (09/02 0534) Pulse Rate:  [65-69] 69 (09/02 0534) Resp:  [18-20] 20 (09/02 0534) BP: (101-112)/(36-63) 112/62 mmHg (09/02 0534) SpO2:  [93 %-100 %] 96 % (09/02 0534) Last BM Date: 07/17/14  Intake/Output from previous day: 09/01 0701 - 09/02 0700 In: 3762.5 [I.V.:3762.5] Out: -  Intake/Output this shift:    GI: morbidly obese   Lab Results:   Recent Labs  07/20/14 0714 07/21/14 0442  WBC 12.4* 11.1*  HGB 14.2 13.6  HCT 43.1 41.6  PLT 147* 155   BMET  Recent Labs  07/21/14 0442 07/22/14 0324  NA 140 137  K 5.2 4.7  CL 102 100  CO2 28 28  GLUCOSE 138* 131*  BUN 9 8  CREATININE 1.00 1.00  CALCIUM 9.0 8.9   PT/INR No results found for this basename: LABPROT, INR,  in the last 72 hours ABG No results found for this basename: PHART, PCO2, PO2, HCO3,  in the last 72 hours  Studies/Results: No results found.  Anti-infectives: Anti-infectives   None      Assessment/Plan: Better for LGB / IOC TODAY. Slight risk of exacerbation of  Pancreatitis but he looks and feels much better. The procedure has been discussed with the patient. Operative and non operative treatments have been discussed. Risks of surgery include bleeding, infection,  Common bile duct injury,  Injury to the stomach,liver, colon,small intestine, abdominal wall,  Diaphragm,  Major blood vessels,  And the need for an open procedure.  Other risks include worsening of medical problems, death,  DVT and pulmonary embolism, and cardiovascular events.   Medical options have also been discussed. The patient has been informed of long term expectations of surgery and non surgical options,  The patient agrees to proceed.   to or  LOS: 4 days    Ameya Vowell A. 07/22/2014  

## 2014-07-22 NOTE — Op Note (Signed)
Laparoscopic Cholecystectomy with IOC Procedure Note  Indications: This patient presents with symptomatic gallbladder disease and will undergo laparoscopic cholecystectomy.The procedure has been discussed with the patient. Operative and non operative treatments have been discussed. Risks of surgery include bleeding, infection,  Common bile duct injury,  Injury to the stomach,liver, colon,small intestine, abdominal wall,  Diaphragm,  Major blood vessels,  And the need for an open procedure.  Other risks include worsening of medical problems, death,  DVT and pulmonary embolism, and cardiovascular events.   Medical options have also been discussed. The patient has been informed of long term expectations of surgery and non surgical options,  The patient agrees to proceed.    Pre-operative Diagnosis: gallstone pancreatitis   Post-operative Diagnosis: Same  Surgeon: Seva Chancy A.   Assistants: Hitchcock RNFA  Anesthesia: General endotracheal anesthesia and Local anesthesia 0.25.% bupivacaine, with epinephrine  ASA Class: 3  Procedure Details  The patient was seen again in the Holding Room. The risks, benefits, complications, treatment options, and expected outcomes were discussed with the patient. The possibilities of reaction to medication, pulmonary aspiration, perforation of viscus, bleeding, recurrent infection, finding a normal gallbladder, the need for additional procedures, failure to diagnose a condition, the possible need to convert to an open procedure, and creating a complication requiring transfusion or operation were discussed with the patient. The patient and/or family concurred with the proposed plan, giving informed consent. The site of surgery properly noted/marked. The patient was taken to Operating Room, identified as Ragan Reale and the procedure verified as Laparoscopic Cholecystectomy with Intraoperative Cholangiograms. A Time Out was held and the above information  confirmed.  Prior to the induction of general anesthesia, antibiotic prophylaxis was administered. General endotracheal anesthesia was then administered and tolerated well. After the induction, the abdomen was prepped in the usual sterile fashion. The patient was positioned in the supine position with the left arm comfortably tucked, along with some reverse Trendelenburg.  Local anesthetic agent was injected into the skin near the umbilicus and an incision made. The midline fascia was incised and the Hasson technique was used to introduce a 12 mm port under direct vision. It was secured with a figure of eight Vicryl suture placed in the usual fashion. Pneumoperitoneum was then created with CO2 and tolerated well without any adverse changes in the patient's vital signs. Additional trocars were introduced under direct vision with an 11 mm trocar in the epigastrium and TWO  5 mm trocars in the right upper quadrant. All skin incisions were infiltrated with a local anesthetic agent before making the incision and placing the trocars.   The gallbladder was identified, the fundus grasped and retracted cephalad. Adhesions were lysed bluntly and with the electrocautery where indicated, taking care not to injure any adjacent organs or viscus. The infundibulum was grasped and retracted laterally, exposing the peritoneum overlying the triangle of Calot. This was then divided and exposed in a blunt fashion. The cystic duct was clearly identified and bluntly dissected circumferentially. The junctions of the gallbladder, cystic duct and common bile duct were clearly identified prior to the division of any linear structure.   An incision was made in the cystic duct and the cholangiogram catheter introduced. The catheter was secured using an endoclip. The study showed no stones and good visualization of the distal and proximal biliary tree. The catheter was then removed.   The cystic duct was then  ligated with surgical  clips  on the patient side and  clipped on the gallbladder side  and divided. The cystic artery was identified, dissected free, ligated with clips and divided as well. Posterior cystic artery clipped and divided.  The gallbladder was dissected from the liver bed in retrograde fashion with the electrocautery. The gallbladder was removed with Encocatch bag. The liver bed was irrigated and inspected. Hemostasis was achieved with the electrocautery. Copious irrigation was utilized and was repeatedly aspirated until clear all particulate matter. Hemostasis was achieved with no signs  Of bleeding or bile leakage.  Pneumoperitoneum was completely reduced after viewing removal of the trocars under direct vision. The wound was thoroughly irrigated and the fascia was then closed with a figure of eight suture using suture passer; the skin was then closed with 4 0  MONOCRYL  and a sterile dressing was applied.  Instrument, sponge, and needle counts were correct at closure and at the conclusion of the case.   Findings:  gallbladder  edema with gallstones   Estimated Blood Loss: less than 50 mL                 Total IV Fluids: 600 mL         Specimens: Gallbladder           Complications: None; patient tolerated the procedure well.         Disposition: PACU - hemodynamically stable.         Condition: stable

## 2014-07-22 NOTE — Anesthesia Postprocedure Evaluation (Signed)
  Anesthesia Post-op Note  Patient: Evan Hamilton  Procedure(s) Performed: Procedure(s): LAPAROSCOPIC CHOLECYSTECTOMY WITH INTRAOPERATIVE CHOLANGIOGRAM (N/A)  Patient Location: PACU  Anesthesia Type:General  Level of Consciousness: awake, alert , oriented and patient cooperative  Airway and Oxygen Therapy: Patient Spontanous Breathing  Post-op Pain: mild  Post-op Assessment: Post-op Vital signs reviewed  Post-op Vital Signs: Reviewed and stable  Last Vitals:  Filed Vitals:   07/22/14 1249  BP: 127/69  Pulse: 59  Temp:   Resp: 20    Complications: No apparent anesthesia complications

## 2014-07-22 NOTE — H&P (View-Only) (Signed)
Day of Surgery  Subjective: Pt with less pain.    Objective: Vital signs in last 24 hours: Temp:  [98.1 F (36.7 C)-99.1 F (37.3 C)] 98.3 F (36.8 C) (09/02 0534) Pulse Rate:  [65-69] 69 (09/02 0534) Resp:  [18-20] 20 (09/02 0534) BP: (101-112)/(36-63) 112/62 mmHg (09/02 0534) SpO2:  [93 %-100 %] 96 % (09/02 0534) Last BM Date: 07/17/14  Intake/Output from previous day: 09/01 0701 - 09/02 0700 In: 3762.5 [I.V.:3762.5] Out: -  Intake/Output this shift:    GI: morbidly obese   Lab Results:   Recent Labs  07/20/14 0714 07/21/14 0442  WBC 12.4* 11.1*  HGB 14.2 13.6  HCT 43.1 41.6  PLT 147* 155   BMET  Recent Labs  07/21/14 0442 07/22/14 0324  NA 140 137  K 5.2 4.7  CL 102 100  CO2 28 28  GLUCOSE 138* 131*  BUN 9 8  CREATININE 1.00 1.00  CALCIUM 9.0 8.9   PT/INR No results found for this basename: LABPROT, INR,  in the last 72 hours ABG No results found for this basename: PHART, PCO2, PO2, HCO3,  in the last 72 hours  Studies/Results: No results found.  Anti-infectives: Anti-infectives   None      Assessment/Plan: Better for LGB / IOC TODAY. Slight risk of exacerbation of  Pancreatitis but he looks and feels much better. The procedure has been discussed with the patient. Operative and non operative treatments have been discussed. Risks of surgery include bleeding, infection,  Common bile duct injury,  Injury to the stomach,liver, colon,small intestine, abdominal wall,  Diaphragm,  Major blood vessels,  And the need for an open procedure.  Other risks include worsening of medical problems, death,  DVT and pulmonary embolism, and cardiovascular events.   Medical options have also been discussed. The patient has been informed of long term expectations of surgery and non surgical options,  The patient agrees to proceed.   to or  LOS: 4 days    Avari Nevares A. 07/22/2014

## 2014-07-22 NOTE — Transfer of Care (Signed)
Immediate Anesthesia Transfer of Care Note  Patient: Evan Hamilton  Procedure(s) Performed: Procedure(s): LAPAROSCOPIC CHOLECYSTECTOMY WITH INTRAOPERATIVE CHOLANGIOGRAM (N/A)  Patient Location: PACU  Anesthesia Type:General  Level of Consciousness: awake, alert  and oriented  Airway & Oxygen Therapy: Patient Spontanous Breathing and Patient connected to face mask oxygen  Post-op Assessment: Report given to PACU RN, Post -op Vital signs reviewed and stable and Patient moving all extremities X 4  Post vital signs: Reviewed and stable  Complications: No apparent anesthesia complications

## 2014-07-22 NOTE — Anesthesia Procedure Notes (Signed)
Procedure Name: Intubation Date/Time: 07/22/2014 9:54 AM Performed by: Ollen Bowl Pre-anesthesia Checklist: Patient identified, Emergency Drugs available, Suction available, Patient being monitored and Timeout performed Patient Re-evaluated:Patient Re-evaluated prior to inductionOxygen Delivery Method: Circle system utilized and Simple face mask Preoxygenation: Pre-oxygenation with 100% oxygen Intubation Type: IV induction Ventilation: Mask ventilation with difficulty and Two handed mask ventilation required Laryngoscope Size: Mac and 4 Grade View: Grade III Tube type: Oral Tube size: 7.5 mm Number of attempts: 1 Airway Equipment and Method: Patient positioned with wedge pillow,  Stylet and LTA kit utilized Placement Confirmation: ETT inserted through vocal cords under direct vision,  positive ETCO2 and breath sounds checked- equal and bilateral Secured at: 24 cm Tube secured with: Tape Dental Injury: Teeth and Oropharynx as per pre-operative assessment

## 2014-07-22 NOTE — Anesthesia Preprocedure Evaluation (Addendum)
Anesthesia Evaluation  Patient identified by MRN, date of birth, ID band Patient awake    Reviewed: Allergy & Precautions, H&P , NPO status , Patient's Chart, lab work & pertinent test results  Airway Mallampati: II TM Distance: >3 FB Neck ROM: Full    Dental  (+) Teeth Intact, Dental Advisory Given,    Pulmonary Current Smoker,          Cardiovascular     Neuro/Psych    GI/Hepatic   Endo/Other  diabetes, Type 2  Renal/GU      Musculoskeletal   Abdominal   Peds  Hematology   Anesthesia Other Findings   Reproductive/Obstetrics                          Anesthesia Physical Anesthesia Plan  ASA: II  Anesthesia Plan: General   Post-op Pain Management:    Induction: Intravenous  Airway Management Planned: Oral ETT  Additional Equipment:   Intra-op Plan:   Post-operative Plan: Extubation in OR  Informed Consent: I have reviewed the patients History and Physical, chart, labs and discussed the procedure including the risks, benefits and alternatives for the proposed anesthesia with the patient or authorized representative who has indicated his/her understanding and acceptance.     Plan Discussed with: CRNA, Anesthesiologist and Surgeon  Anesthesia Plan Comments:         Anesthesia Quick Evaluation

## 2014-07-23 ENCOUNTER — Encounter (HOSPITAL_COMMUNITY): Payer: Self-pay | Admitting: Surgery

## 2014-07-23 MED ORDER — OXYCODONE-ACETAMINOPHEN 5-325 MG PO TABS: 1.0000 | ORAL_TABLET | ORAL | Status: DC | PRN

## 2014-07-23 MED ORDER — POLYETHYLENE GLYCOL 3350 17 G PO PACK
17.0000 g | PACK | Freq: Every day | ORAL | Status: DC
  Administered 2014-07-23: 17 g via ORAL
  Filled 2014-07-23: qty 1

## 2014-07-23 NOTE — Discharge Summary (Signed)
Patient ID: Evan Hamilton MRN: [id number] DOB/AGE: 1966/09/20 48 y.o.  Admit date: 07/18/2014 Discharge date: 07/23/2014  Procedures: lap chole with IOC  Consults: None  Reason for Admission: Patient was seen in the emergency department on August 21 and diagnosed with gallstones. He was given a referral to our office but has not been seen yet. The pain returned last night and was much more severe. This time it was in his epigastrium and left upper quadrant. He returned to the emergency department where he was found to have elevated lipase consistent with biliary pancreatitis. I was asked to see him for admission. His pain has improved significantly after receiving pain medication. He has recently lost over 40 pounds.  Admission Diagnoses:  1. Gallstone pancreatitis  Hospital Course: the patient was admitted and kept NPO and bowel rest secondary to his pancreatitis.  It took 4 days of rest before his pancreatitis had resolved enough to feel comfortable operating on him.  On HD 4, he was taken to the OR where he underwent a lap chole with IOC.  He tolerated this well.  On POD 1, he was tolerating a regular diet and oral pain medications.  He was stable for dc home.  PE: Abd: soft, appropriately tender, +BS, ND, incisions c/d/i with dermabond  Discharge Diagnoses:  Active Problems:   Acute biliary pancreatitis s/p lap chole   Discharge Medications:   Medication List         esomeprazole 40 MG capsule  Commonly known as:  NEXIUM  Take 40 mg by mouth daily as needed (for heartburn).     GINKOBA PO  Take 1 tablet by mouth daily.     multivitamin tablet  Take 1 tablet by mouth daily.     omega-3 acid ethyl esters 1 G capsule  Commonly known as:  LOVAZA  Take 1 g by mouth daily.     oxyCODONE-acetaminophen 5-325 MG per tablet  Commonly known as:  PERCOCET/ROXICET  Take 1-2 tablets by mouth every 4 (four) hours as needed for moderate pain.     oxyCODONE-acetaminophen 5-325 MG per  tablet  Commonly known as:  PERCOCET  Take 1-2 tablets by mouth every 4 (four) hours as needed.     POTASSIUM GLUCONATE PO  Take 1 tablet by mouth daily.     PROBIOTIC PO  Take 1 tablet by mouth daily.     VITAMIN B-12 PO  Take 1 tablet by mouth daily.        Discharge Instructions:     Follow-up Information   Follow up with [facility] On 08/18/2014. (2:00pm, arrive at 1:30pm.  we have a new computer system and need you there 30 minutes early in order to be seen on time)    Contact information:   [address]   [place] [address] [phone]     follow up with PCP for elevated blood sugars  Signed: Avah Bashor E 07/23/2014, 10:00 AM

## 2014-07-23 NOTE — Progress Notes (Signed)
Discharge to home with family via wheelchair.  Pain denied and patient had ambulated in hall several times without pain or diffculty. Pt demonstrated understanding via teachback

## 2014-07-23 NOTE — Discharge Summary (Signed)
Pt to be discharged today doing well.

## 2014-07-23 NOTE — Discharge Instructions (Signed)
[facility] ______[facility] °LAPAROSCOPIC SURGERY: POST OP INSTRUCTIONS °Always review your discharge instruction sheet given to you by the facility where your surgery was performed. °IF YOU HAVE DISABILITY OR FAMILY LEAVE FORMS, YOU MUST BRING THEM TO THE OFFICE FOR PROCESSING.   °DO NOT GIVE THEM TO YOUR DOCTOR. ° °1. A prescription for pain medication may be given to you upon discharge.  Take your pain medication as prescribed, if needed.  If narcotic pain medicine is not needed, then you may take acetaminophen (Tylenol) or ibuprofen (Advil) as needed. °2. Take your usually prescribed medications unless otherwise directed. °3. If you need a refill on your pain medication, please contact your pharmacy.  They will contact our office to request authorization. Prescriptions will not be filled after 5pm or on week-ends. °4. You should follow a light diet the first few days after arrival home, such as soup and crackers, etc.  Be sure to include lots of fluids daily. °5. Most patients will experience some swelling and bruising in the area of the incisions.  Ice packs will help.  Swelling and bruising can take several days to resolve.  °6. It is common to experience some constipation if taking pain medication after surgery.  Increasing fluid intake and taking a stool softener (such as Colace) will usually help or prevent this problem from occurring.  A mild laxative (Milk of Magnesia or Miralax) should be taken according to package instructions if there are no bowel movements after 48 hours. °7. Unless discharge instructions indicate otherwise, you may remove your bandages 24-48 hours after surgery, and you may shower at that time.  You may have steri-strips (small skin tapes) in place directly over the incision.  These strips should be left on the skin for 7-10 days.  If your surgeon used skin glue on the incision, you may shower in 24 hours.  The glue will flake off over the next 2-3 weeks.  Any sutures or  staples will be removed at the office during your follow-up visit. °8. ACTIVITIES:  You may resume regular (light) daily activities beginning the next day--such as daily self-care, walking, climbing stairs--gradually increasing activities as tolerated.  You may have sexual intercourse when it is comfortable.  Refrain from any heavy lifting or straining until approved by your doctor. °a. You may drive when you are no longer taking prescription pain medication, you can comfortably wear a seatbelt, and you can safely maneuver your car and apply brakes. °b. RETURN TO WORK:  __________________________________________________________ °9. You should see your doctor in the office for a follow-up appointment approximately 2-3 weeks after your surgery.  Make sure that you call for this appointment within a day or two after you arrive home to insure a convenient appointment time. °10. OTHER INSTRUCTIONS: __________________________________________________________________________________________________________________________ __________________________________________________________________________________________________________________________ °WHEN TO CALL YOUR DOCTOR: °1. Fever over 101.0 °2. Inability to urinate °3. Continued bleeding from incision. °4. Increased pain, redness, or drainage from the incision. °5. Increasing abdominal pain ° °The clinic staff is available to answer your questions during regular business hours.  Please don’t hesitate to call and ask to speak to one of the nurses for clinical concerns.  If you have a medical emergency, go to the nearest emergency room or call 911.  A surgeon from [facility] is always on call at the hospital. °[address], [address], [place], [place]  [address] ? [address] [address], [place],    [address] °[phone] ? [phone] ? FAX [phone] °Web site:   [email or web address]  Hyperglycemia Hyperglycemia occurs when the glucose  (sugar) in your blood is too high. Hyperglycemia can happen for many reasons, but it most often happens to people who do not know they have diabetes or are not managing their diabetes properly.  CAUSES  Whether you have diabetes or not, there are other causes of hyperglycemia. Hyperglycemia can occur when you have diabetes, but it can also occur in other situations that you might not be as aware of, such as: Diabetes  If you have diabetes and are having problems controlling your blood glucose, hyperglycemia could occur because of some of the following reasons:  Not following your meal plan.  Not taking your diabetes medications or not taking it properly.  Exercising less or doing less activity than you normally do.  Being sick. Pre-diabetes  This cannot be ignored. Before people develop Type 2 diabetes, they almost always have "pre-diabetes." This is when your blood glucose levels are higher than normal, but not yet high enough to be diagnosed as diabetes. Research has shown that some long-term damage to the body, especially the heart and circulatory system, may already be occurring during pre-diabetes. If you take action to manage your blood glucose when you have pre-diabetes, you may delay or prevent Type 2 diabetes from developing. Stress  If you have diabetes, you may be "diet" controlled or on oral medications or insulin to control your diabetes. However, you may find that your blood glucose is higher than usual in the hospital whether you have diabetes or not. This is often referred to as "stress hyperglycemia." Stress can elevate your blood glucose. This happens because of hormones put out by the body during times of stress. If stress has been the cause of your high blood glucose, it can be followed regularly by your caregiver. That way he/she can make sure your hyperglycemia does not continue to get worse or progress to diabetes. Steroids  Steroids are medications that act on the  infection fighting system (immune system) to block inflammation or infection. One side effect can be a rise in blood glucose. Most people can produce enough extra insulin to allow for this rise, but for those who cannot, steroids make blood glucose levels go even higher. It is not unusual for steroid treatments to "uncover" diabetes that is developing. It is not always possible to determine if the hyperglycemia will go away after the steroids are stopped. A special blood test called an A1c is sometimes done to determine if your blood glucose was elevated before the steroids were started. SYMPTOMS  Thirsty.  Frequent urination.  Dry mouth.  Blurred vision.  Tired or fatigue.  Weakness.  Sleepy.  Tingling in feet or leg. DIAGNOSIS  Diagnosis is made by monitoring blood glucose in one or all of the following ways:  A1c test. This is a chemical found in your blood.  Fingerstick blood glucose monitoring.  Laboratory results. TREATMENT  First, knowing the cause of the hyperglycemia is important before the hyperglycemia can be treated. Treatment may include, but is not be limited to:  Education.  Change or adjustment in medications.  Change or adjustment in meal plan.  Treatment for an illness, infection, etc.  More frequent blood glucose monitoring.  Change in exercise plan.  Decreasing or stopping steroids.  Lifestyle changes. HOME CARE INSTRUCTIONS   Test your blood glucose as directed.  Exercise regularly. Your caregiver will give you instructions about exercise. Pre-diabetes or diabetes which comes on with stress is helped by exercising.  Eat wholesome, balanced meals. Eat often and at regular, fixed times. Your caregiver or nutritionist will give you a meal plan to guide your sugar intake.  Being at an ideal weight is important. If needed, losing as little as 10 to 15 pounds may help improve blood glucose levels. SEEK MEDICAL CARE IF:   You have questions about  medicine, activity, or diet.  You continue to have symptoms (problems such as increased thirst, urination, or weight gain). SEEK IMMEDIATE MEDICAL CARE IF:   You are vomiting or have diarrhea.  Your breath smells fruity.  You are breathing faster or slower.  You are very sleepy or incoherent.  You have numbness, tingling, or pain in your feet or hands.  You have chest pain.  Your symptoms get worse even though you have been following your caregiver's orders.  If you have any other questions or concerns. Document Released: 05/02/2001 Document Revised: 01/29/2012 Document Reviewed: 03/04/2012 [facility] Patient Information 2015 [place], [place]. This information is not intended to replace advice given to you by your health care provider. Make sure you discuss any questions you have with your health care provider.

## 2014-07-28 ENCOUNTER — Ambulatory Visit (INDEPENDENT_AMBULATORY_CARE_PROVIDER_SITE_OTHER): Payer: BC Managed Care – PPO | Admitting: General Surgery

## 2015-06-06 IMAGING — RF DG CHOLANGIOGRAM OPERATIVE
1 series · 4 of 4 positions shown · non-contrast
Comparison: Abdominal ultrasound - 07/10/2014

CLINICAL DATA: Laparoscopic cholecystectomy with intraoperative
cholangiogram to evaluate for gallstones.

EXAM:
INTRAOPERATIVE CHOLANGIOGRAM
FLUOROSCOPY TIME:  7 seconds

[Series 1: run · 4 of 47 frames shown]
[frame 8/47]
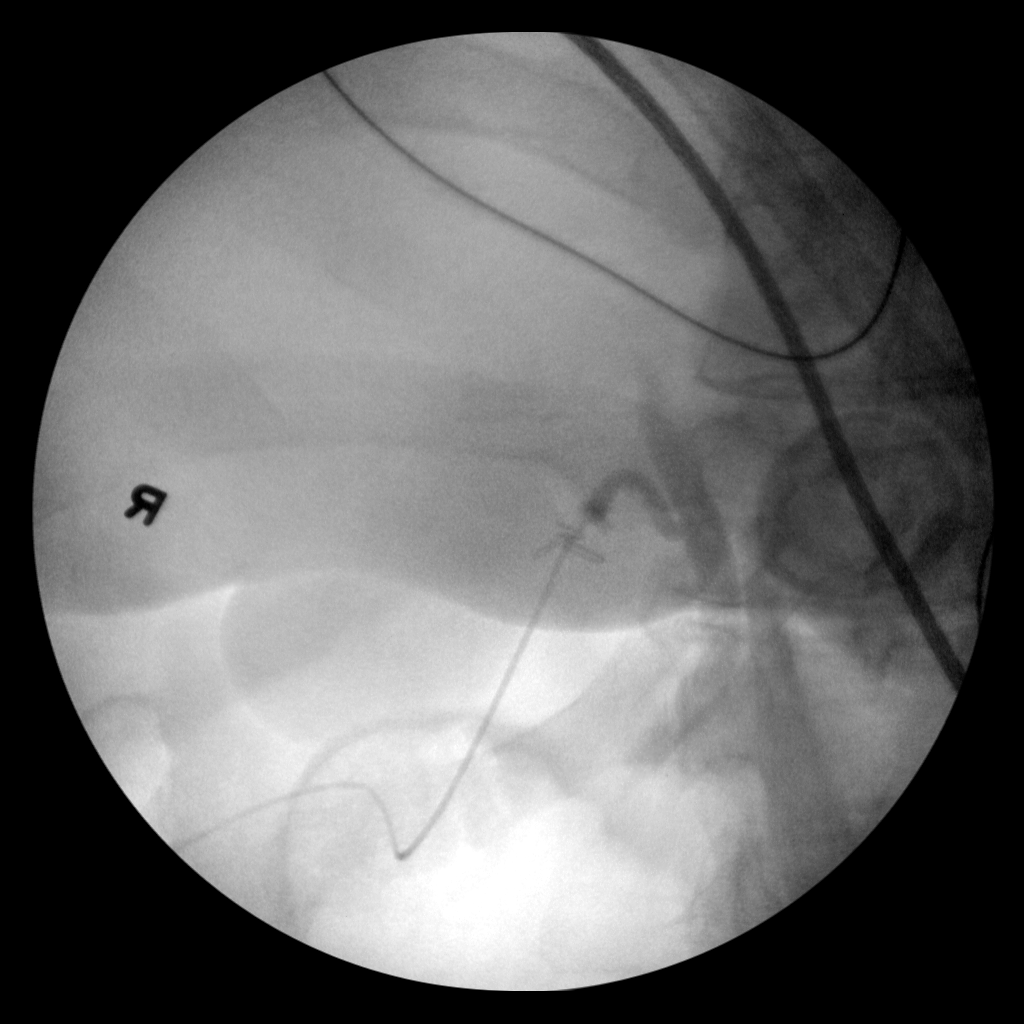
[frame 12/47]
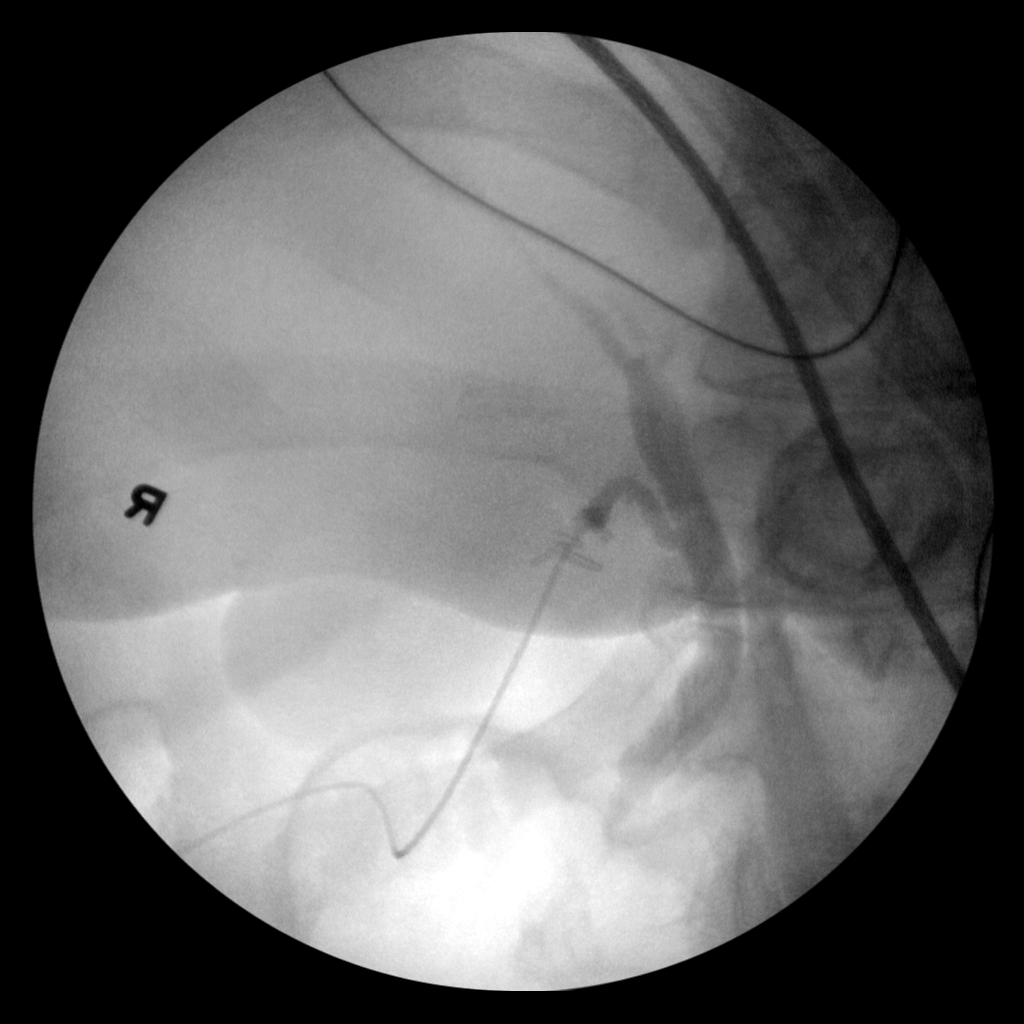
[frame 24/47]
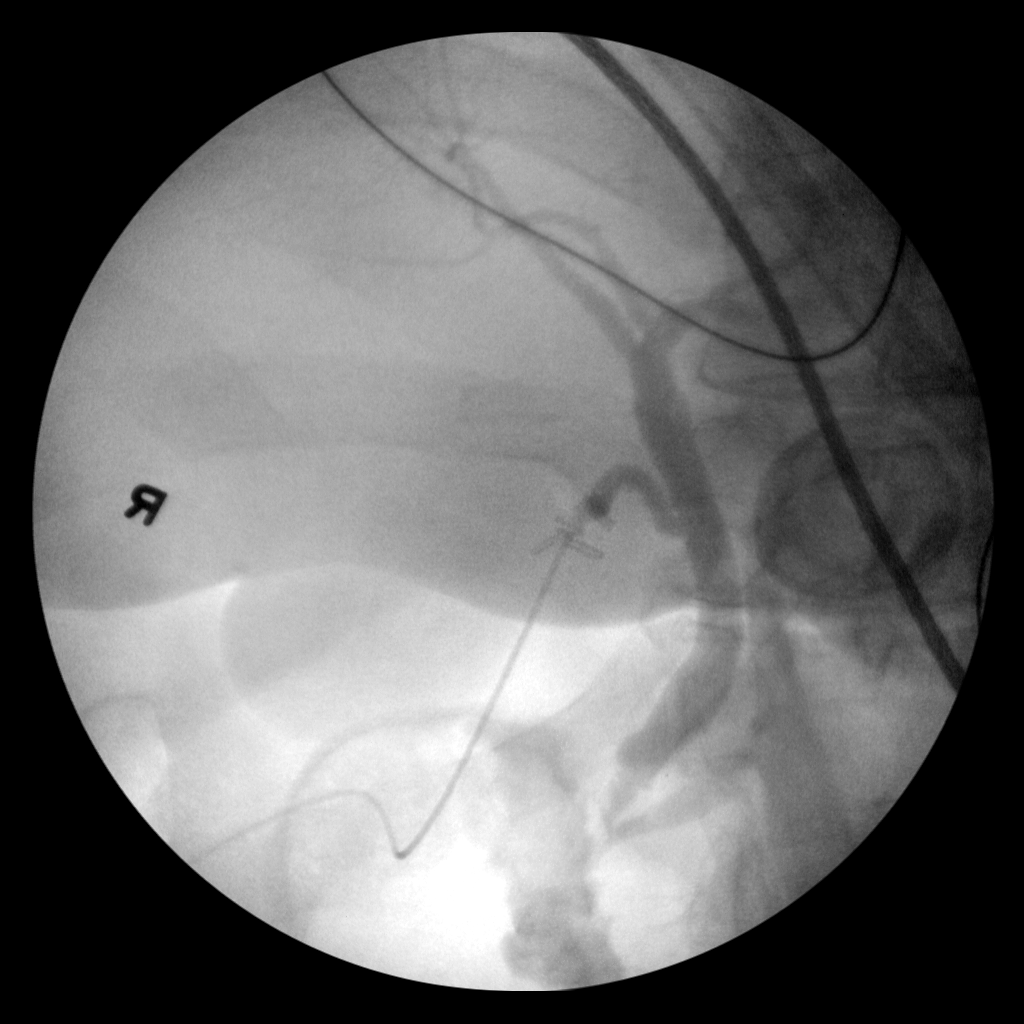
[frame 40/47]
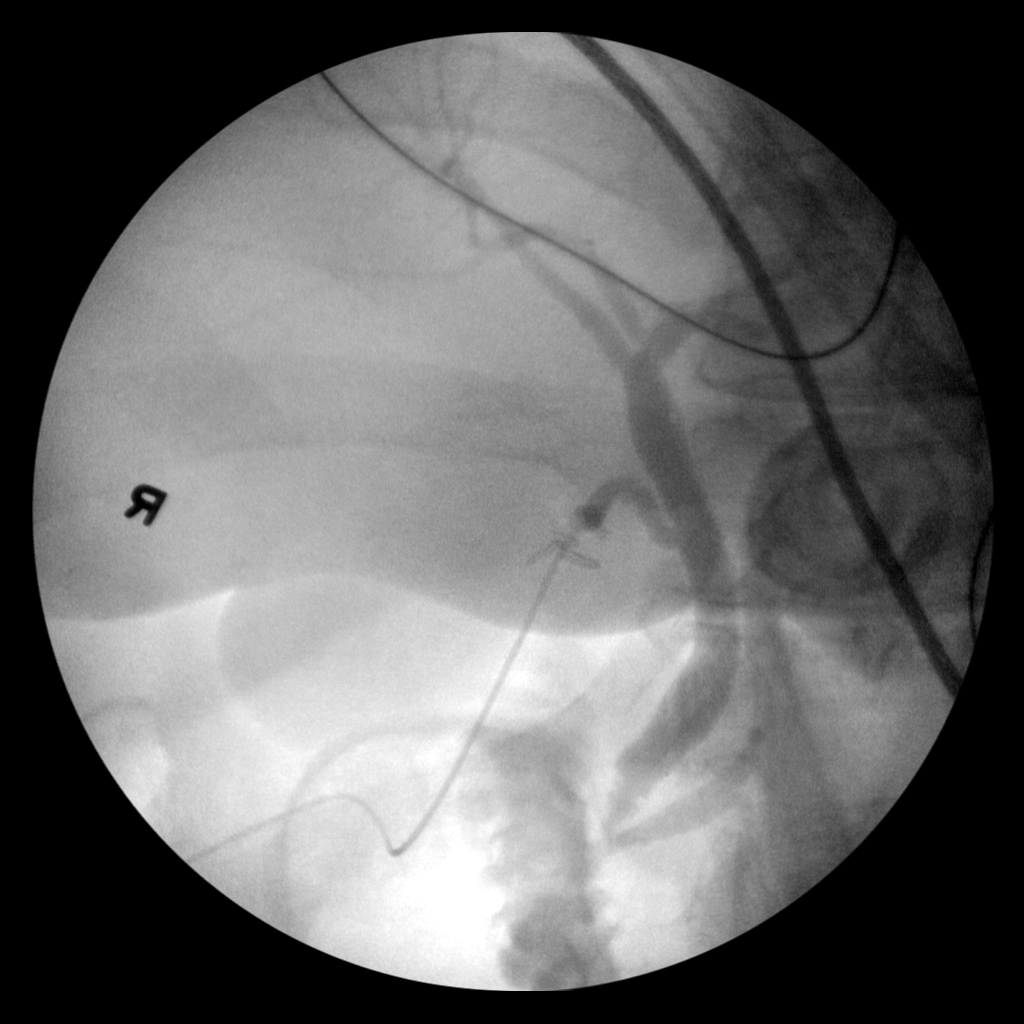

[4 of 4 positions shown; findings below may reference images not displayed]

FINDINGS: Intraoperative angiographic images of the right upper abdominal
quadrant during laparoscopic cholecystectomy are provided for
review.

Surgical clips overlie the expected location of the gallbladder
fossa.

Contrast injection demonstrates selective cannulation of the central
aspect of the cystic duct.

There is passage of contrast through the central aspect of the
cystic duct with filling of a non dilated common bile duct. There is
passage of contrast though the CBD and into the descending portion
of the duodenum.

There is minimal reflux of injected contrast into the common hepatic
duct and central aspect of the non dilated intrahepatic biliary
system. There is minimal opacification the central aspect of the
pancreatic duct.

There are no discrete filling defects within the opacified portions
of the biliary system to suggest the presence of
choledocholithiasis.
IMPRESSION: No evidence of choledocholithiasis.

## 2016-05-26 ENCOUNTER — Ambulatory Visit (INDEPENDENT_AMBULATORY_CARE_PROVIDER_SITE_OTHER): Payer: BLUE CROSS/BLUE SHIELD | Admitting: Emergency Medicine

## 2016-05-26 VITALS — BP 118/80 | HR 81 | Temp 98.7°F | Resp 16 | Ht 74.0 in | Wt 379.0 lb

## 2016-05-26 DIAGNOSIS — L959 Vasculitis limited to the skin, unspecified: Secondary | ICD-10-CM

## 2016-05-26 DIAGNOSIS — R7989 Other specified abnormal findings of blood chemistry: Secondary | ICD-10-CM

## 2016-05-26 DIAGNOSIS — E119 Type 2 diabetes mellitus without complications: Secondary | ICD-10-CM

## 2016-05-26 DIAGNOSIS — L309 Dermatitis, unspecified: Secondary | ICD-10-CM | POA: Diagnosis not present

## 2016-05-26 DIAGNOSIS — R21 Rash and other nonspecific skin eruption: Secondary | ICD-10-CM | POA: Diagnosis not present

## 2016-05-26 DIAGNOSIS — D692 Other nonthrombocytopenic purpura: Secondary | ICD-10-CM | POA: Diagnosis not present

## 2016-05-26 DIAGNOSIS — R945 Abnormal results of liver function studies: Secondary | ICD-10-CM

## 2016-05-26 LAB — POCT CBC
Granulocyte percent: 69.6 %G (ref 37–80)
HCT, POC: 45.1 % (ref 43.5–53.7)
Hemoglobin: 16.3 g/dL (ref 14.1–18.1)
Lymph, poc: 2.8 (ref 0.6–3.4)
MCH, POC: 31.2 pg (ref 27–31.2)
MCHC: 36.2 g/dL — AB (ref 31.8–35.4)
MCV: 86.3 fL (ref 80–97)
MID (CBC): 0.9 (ref 0–0.9)
MPV: 7.4 fL (ref 0–99.8)
POC Granulocyte: 8.5 — AB (ref 2–6.9)
POC LYMPH PERCENT: 23.2 %L (ref 10–50)
POC MID %: 7.2 %M (ref 0–12)
Platelet Count, POC: 160 10*3/uL (ref 142–424)
RBC: 5.23 M/uL (ref 4.69–6.13)
RDW, POC: 12.8 %
WBC: 12.2 10*3/uL — AB (ref 4.6–10.2)

## 2016-05-26 LAB — POCT URINALYSIS DIPSTICK
Bilirubin, UA: NEGATIVE
Blood, UA: NEGATIVE
GLUCOSE UA: NEGATIVE
Ketones, UA: NEGATIVE
Leukocytes, UA: NEGATIVE
NITRITE UA: NEGATIVE
PH UA: 5
PROTEIN UA: 100
SPEC GRAV UA: 1.025
UROBILINOGEN UA: 0.2

## 2016-05-26 LAB — COMPREHENSIVE METABOLIC PANEL
ALBUMIN: 4.2 g/dL (ref 3.6–5.1)
ALT: 103 U/L — ABNORMAL HIGH (ref 9–46)
AST: 70 U/L — ABNORMAL HIGH (ref 10–40)
Alkaline Phosphatase: 69 U/L (ref 40–115)
BILIRUBIN TOTAL: 0.6 mg/dL (ref 0.2–1.2)
BUN: 16 mg/dL (ref 7–25)
CHLORIDE: 103 mmol/L (ref 98–110)
CO2: 27 mmol/L (ref 20–31)
Calcium: 9.6 mg/dL (ref 8.6–10.3)
Creat: 0.97 mg/dL (ref 0.60–1.35)
GLUCOSE: 149 mg/dL — AB (ref 65–99)
Potassium: 5 mmol/L (ref 3.5–5.3)
SODIUM: 138 mmol/L (ref 135–146)
Total Protein: 7.5 g/dL (ref 6.1–8.1)

## 2016-05-26 LAB — POCT GLYCOSYLATED HEMOGLOBIN (HGB A1C): HEMOGLOBIN A1C: 6.8

## 2016-05-26 LAB — GLUCOSE, POCT (MANUAL RESULT ENTRY): POC GLUCOSE: 140 mg/dL — AB (ref 70–99)

## 2016-05-26 LAB — SEDIMENTATION RATE: Sed Rate: 32 mm/hr — ABNORMAL HIGH (ref 0–15)

## 2016-05-26 MED ORDER — TRIAMCINOLONE ACETONIDE 0.5 % EX OINT: 1.0000 "application " | TOPICAL_OINTMENT | Freq: Two times a day (BID) | CUTANEOUS | Status: DC

## 2016-05-26 NOTE — Patient Instructions (Addendum)
It is unclear exactly what is causing your rash at this time.  We have obtained a biopsy and are checking several labs. Please leave the dressing in place until tomorrow. If you start noticing any redness around the area or drainage come back to see us asap. If you have fevers or chills come back to see us asap. We will be in contact with you once all these results are back.  Your blood sugar was in the diabetic range today. I encourage you to come back in the next 1-2 weeks to see somebody to start to manage your diabetes care.  Please apply the steroid topical twice daily to the thigh and groin rash for 1-2 weeks. We can follow up on this when you return for your diabetes care.   Vasculitis Vasculitis is swelling (inflammation) of the blood vessels. With vasculitis, the blood vessels can become thick, narrow, scarred, or weak, and enough blood may not be able to flow through them. This can cause damage to the muscles, kidneys, lungs, brain, and other parts of the body.  There are many types of vasculitis. Some last only a short time while others last a long time. CAUSES  The exact cause is unknown, but vasculitis can develop when the body's immune system attacks its own blood vessels. This attack can be caused by:  An infection.  An immune system disease, such as lupus, rheumatoid arthritis, or scleroderma.  An allergic reaction to a medicine.  Cancer that affects blood cells, such as leukemia and lymphoma. RISK FACTORS  Being a smoker.  Being under stress.  Having a physical injury. SIGNS AND SYMPTOMS  Symptoms vary depending on the type of vasculitis you have. Symptoms that are common to all types of vasculitis include:  Fever.  Poor appetite.  Weight loss.  Feeling very tired.  Having aches and pains.  Weakness.  Numbness in an area of your body. Symptoms for specific types of vasculitis include:  Skin problems, such as sores, spots, or rashes.  Trouble  seeing.  Trouble breathing.  Blood in your urine.  Headaches.  Stomach pain.  Stuffy or bloody nose. DIAGNOSIS  Your health care provider will ask about your symptoms and do a physical exam. You may have tests done, such as:  A complete blood count (CBC).  Erythrocyte sedimentation, also called sed rate test.  C-reactive protein (CRP).  Antineutrophil cytoplasmic antibodies (ANCA).  A urine test.  A biopsy of a blood vessel.  A nerve conduction study.  Imaging tests, such as:  X-rays.  A CT scan.  An ultrasound.  An MRI.  Angiography. TREATMENT  Treatment will depend on the type of vasculitis you have and how severe it is. Sometimes treatment is not needed. Treatment often includes:  Medicines.  Physical therapy or occupational therapy. This helps strengthen muscles that were weakened by the disease. You will need to see your health care provider while you are being treated. During follow-up visits, your health care provider will:  Perform blood tests and bone density tests.  Check your blood pressure and blood sugar.  Check for side effects of any medicines you are taking. Vasculitis cannot always be cured. Sometimes symptoms go away but the disease does not (the disease goes in remission). If symptoms return, increased treatment may be needed. HOME CARE INSTRUCTIONS   Take medicines only as directed by your health care provider.  Keep all follow-up visits as directed by your health care provider. This is important.  Exercise. Talk  with your health care provider about what exercises are okay for you to do. Usually exercises that increase your heart rate (aerobic exercise), such as walking, are recommended. Aerobic exercise helps control your blood pressure and prevent bone loss.  Follow a healthy diet. Include healthy sources of protein, fruits, vegetables, and whole grains in your diet.  Learn as much as you can about vasculitis, and consider joining a  support group. Understanding your condition and talking with others who have it may help you cope. Talk with your health care provider if you feel stressed, anxious, or depressed. SEEK MEDICAL CARE IF:   Your symptoms return, or you have new symptoms.  Your fever, fatigue, headache, or weight loss gets worse.  You have signs of infection, such as fever, warmth, tenderness, redness, or swelling. SEEK IMMEDIATE MEDICAL CARE IF:   Your vision gets worse.  Your pain does not go away, even after you take pain medicine.  You have chest or stomach pain.  You have trouble breathing.  One side of your face or body suddenly becomes weak or numb.  Your nose bleeds.  There is blood in your urine. MAKE SURE YOU:  Understand these instructions.  Will watch your condition.  Will get help right away if you are not doing well or get worse.   This information is not intended to replace advice given to you by your health care provider. Make sure you discuss any questions you have with your health care provider.   Document Released: 09/02/2009 Document Revised: 11/27/2014 Document Reviewed: 12/31/2013 Elsevier Interactive Patient Education [employer]2016 Elsevier Inc.

## 2016-05-26 NOTE — Progress Notes (Signed)
Subjective:    Patient ID: Evan Hamilton, male    DOB: 06-21-1966, 50 y.o.   MRN: [id number]  Chief Complaint  Patient presents with  . Rash    on both legs notice on Monday    Medications, allergies, past medical history, surgical history, family history, social history and problem list reviewed and updated.  HPI  50 yom presents with above issue.   Has several different rashes on bilateral LEs. Has known chronic venous stasis changes bilaterally.   Starting 3-4 days ago noticed several small mildly itchy red lesions right inner thigh. Went away on own. Next day noticed them on left groin/thigh and further down right thigh. Yesterday noticed on lower abdomen. Not particularly itchy since the first day.   Denies recent ticks, poison ivy or any outdoor exposures. Denies new detergents, soaps, clothing, lotions, etc. Sleeps in bed alone. Denies fevers, chills, headaches, n/v, abd pain over past week. Has had fatigue over past few months.   Has known diabetes. Was on metformin several years but stopped after 2 weeks due to SEs. Felt "out of his mind" on the medicine. Does not recall GI side effects. Has not followed his blood glucose since that time.   Review of Systems See HPI.     Objective:   Physical Exam  Constitutional: He appears well-developed and well-nourished.  Non-toxic appearance. He does not have a sickly appearance. He does not appear ill. No distress.  BP 118/80 mmHg  Pulse 81  Temp(Src) 98.7 F (37.1 C) (Oral)  Resp 16  Ht '6\' 2"'  (1.88 m)  Wt 379 lb (171.913 kg)  BMI 48.64 kg/m2  SpO2 96%   Cardiovascular: Normal rate, regular rhythm and normal heart sounds.   Pulmonary/Chest: Effort normal and breath sounds normal. No tachypnea.  Skin:  Bilateral venous stasis changes to mid lower leg. Linear red rash circumferentially along area where top of compression stockings sit. Several non blanching purpuric lesions bilateral groin, posterior thighs, and lower  abdomen.   Psychiatric: He has a normal mood and affect. His speech is normal and behavior is normal.    Results for orders placed or performed in visit on 05/26/16  POCT glycosylated hemoglobin (Hb A1C)  Result Value Ref Range   Hemoglobin A1C 6.8   POCT glucose (manual entry)  Result Value Ref Range   POC Glucose 140 (A) 70 - 99 mg/dl  POCT CBC  Result Value Ref Range   WBC 12.2 (A) 4.6 - 10.2 K/uL   Lymph, poc 2.8 0.6 - 3.4   POC LYMPH PERCENT 23.2 10 - 50 %L   MID (cbc) 0.9 0 - 0.9   POC MID % 7.2 0 - 12 %M   POC Granulocyte 8.5 (A) 2 - 6.9   Granulocyte percent 69.6 37 - 80 %G   RBC 5.23 4.69 - 6.13 M/uL   Hemoglobin 16.3 14.1 - 18.1 g/dL   HCT, POC 45.1 43.5 - 53.7 %   MCV 86.3 80 - 97 fL   MCH, POC 31.2 27 - 31.2 pg   MCHC 36.2 (A) 31.8 - 35.4 g/dL   RDW, POC 12.8 %   Platelet Count, POC 160 142 - 424 K/uL   MPV 7.4 0 - 99.8 fL  POCT urinalysis dipstick  Result Value Ref Range   Color, UA yellow    Clarity, UA clear    Glucose, UA negative    Bilirubin, UA negative    Ketones, UA negative    Spec Grav,  UA 1.025    Blood, UA negative    pH, UA 5.0    Protein, UA 100    Urobilinogen, UA 0.2    Nitrite, UA negative    Leukocytes, UA Negative Negative      Assessment & Plan:   Maculopapular rash - Plan: POCT CBC, POCT urinalysis dipstick, Comprehensive metabolic panel, Sedimentation Rate, triamcinolone ointment (KENALOG) 0.5 %  Type 2 diabetes mellitus without complication, without long-term current use of insulin (HCC) - Plan: POCT glycosylated hemoglobin (Hb A1C), POCT glucose (manual entry)  Vasculitis of skin  Palpable purpura ([place]) - Plan: Skin biopsy --unclear etiology of rash at this time, suspect vasculitic type process --cbc not concerning, normal plts, slight leukocytosis but afebrile, ua today without blood  --checking cmp, esr, punch biopsy today, f/u results --topical CS today bid for 1-2 wks --A1C checked due to known DMII without being on  DMII regimen, 6.8%, encouraged rtc 1-2 wks for further DMII treatment  --will f/u on rash response to topical steroid when returns for DMII care   Julieta Gutting, PA-C Physician Assistant-Certified [facility]  05/26/2016 11:33 AM

## 2016-06-02 ENCOUNTER — Telehealth: Payer: Self-pay | Admitting: Physician Assistant

## 2016-06-02 NOTE — Addendum Note (Signed)
Addended byDonnajean Lopes: Darden Flemister M on: 06/02/2016 01:32 PM   Modules accepted: Orders

## 2016-06-02 NOTE — Telephone Encounter (Signed)
Called and spoke with pt 7/14 regarding lab, pathology results. He plans to rtc mid next week for diabetes care. I have ordered future labs to further evaluate elevated LFTs, these can be drawn at that visit. I instructed pt to DC his home gingko biloba as this could be a contributing medication to rash. I have referred him to dermatology to review his pathology findings. Case discussed with Dr Cleta Albertsaub.

## 2016-06-06 DIAGNOSIS — I8310 Varicose veins of unspecified lower extremity with inflammation: Secondary | ICD-10-CM | POA: Diagnosis not present

## 2016-06-06 DIAGNOSIS — L309 Dermatitis, unspecified: Secondary | ICD-10-CM | POA: Diagnosis not present

## 2016-06-21 ENCOUNTER — Telehealth: Payer: Self-pay

## 2016-06-21 NOTE — Telephone Encounter (Signed)
Per Dr. Cleta Alberts called patient and advised they return to clinic to follow up on Diabetes.  Patient said they would do so after he finishes his course of prednisone.

## 2016-07-04 DIAGNOSIS — L309 Dermatitis, unspecified: Secondary | ICD-10-CM | POA: Diagnosis not present

## 2016-07-05 DIAGNOSIS — D485 Neoplasm of uncertain behavior of skin: Secondary | ICD-10-CM | POA: Diagnosis not present

## 2016-07-06 DIAGNOSIS — L309 Dermatitis, unspecified: Secondary | ICD-10-CM | POA: Diagnosis not present

## 2016-07-06 DIAGNOSIS — R238 Other skin changes: Secondary | ICD-10-CM | POA: Diagnosis not present

## 2016-07-07 ENCOUNTER — Ambulatory Visit (INDEPENDENT_AMBULATORY_CARE_PROVIDER_SITE_OTHER): Payer: BLUE CROSS/BLUE SHIELD | Admitting: Urgent Care

## 2016-07-07 VITALS — BP 116/78 | HR 78 | Temp 98.2°F | Resp 16 | Ht 74.0 in | Wt 378.0 lb

## 2016-07-07 DIAGNOSIS — E1139 Type 2 diabetes mellitus with other diabetic ophthalmic complication: Secondary | ICD-10-CM | POA: Diagnosis not present

## 2016-07-07 DIAGNOSIS — R7989 Other specified abnormal findings of blood chemistry: Secondary | ICD-10-CM

## 2016-07-07 DIAGNOSIS — R945 Abnormal results of liver function studies: Secondary | ICD-10-CM

## 2016-07-07 DIAGNOSIS — R5383 Other fatigue: Secondary | ICD-10-CM | POA: Diagnosis not present

## 2016-07-07 DIAGNOSIS — F17200 Nicotine dependence, unspecified, uncomplicated: Secondary | ICD-10-CM | POA: Diagnosis not present

## 2016-07-07 LAB — CBC
HCT: 44.9 % (ref 38.5–50.0)
Hemoglobin: 15.5 g/dL (ref 13.2–17.1)
MCH: 30.7 pg (ref 27.0–33.0)
MCHC: 34.5 g/dL (ref 32.0–36.0)
MCV: 88.9 fL (ref 80.0–100.0)
MPV: 10.2 fL (ref 7.5–12.5)
PLATELETS: 171 10*3/uL (ref 140–400)
RBC: 5.05 MIL/uL (ref 4.20–5.80)
RDW: 13.1 % (ref 11.0–15.0)
WBC: 10.4 10*3/uL (ref 3.8–10.8)

## 2016-07-07 LAB — COMPLETE METABOLIC PANEL WITH GFR
ALT: 48 U/L — AB (ref 9–46)
AST: 35 U/L (ref 10–40)
Albumin: 3.8 g/dL (ref 3.6–5.1)
Alkaline Phosphatase: 61 U/L (ref 40–115)
BILIRUBIN TOTAL: 0.5 mg/dL (ref 0.2–1.2)
BUN: 17 mg/dL (ref 7–25)
CALCIUM: 9.1 mg/dL (ref 8.6–10.3)
CO2: 24 mmol/L (ref 20–31)
CREATININE: 0.93 mg/dL (ref 0.60–1.35)
Chloride: 105 mmol/L (ref 98–110)
Glucose, Bld: 175 mg/dL — ABNORMAL HIGH (ref 65–99)
Potassium: 3.8 mmol/L (ref 3.5–5.3)
Sodium: 140 mmol/L (ref 135–146)
TOTAL PROTEIN: 7 g/dL (ref 6.1–8.1)

## 2016-07-07 LAB — HEPATITIS B SURFACE ANTIGEN: HEP B S AG: NEGATIVE

## 2016-07-07 LAB — TSH: TSH: 2.46 mIU/L (ref 0.40–4.50)

## 2016-07-07 LAB — HEPATITIS C ANTIBODY: HCV AB: NEGATIVE

## 2016-07-07 LAB — HEPATITIS B SURFACE ANTIBODY, QUANTITATIVE

## 2016-07-07 NOTE — Progress Notes (Signed)
   MRN: [id number]  Subjective:   Evan Hamilton is a 50 y.o. male who presents for follow up of Type 2 Diabetes Mellitus.   Diagnosis was made several years ago. Patient is not checking home blood sugars. Patient was previously managed with Metformin but failed this medication due to feeling significant fatigue. He has continued to have fatigue however for months now. He was very active earlier in the year, was hiking, using elliptical. He also complains of day time fatigue and sleepiness. Of note, he had a bout of vasculitis in 05/2016. Patient denies blurred vision, polydipsia, chest pain, nausea, vomiting, abdominal pain, hematuria, polyuria, skin infections, numbness or tingling. Patient is not exercising due to fatigue as above. Has occasional drink of alcohol. Smokes 2 packs per week.  Known diabetic complications: At his last eye exam this year, patient was told he had diabetic retinopathy.  Immunizations: Patient refused flu vaccine today.  Objective:   PHYSICAL EXAM BP 116/78 (BP Location: Left Arm, Patient Position: Sitting, Cuff Size: Normal)   Pulse 78   Temp 98.2 F (36.8 C) (Oral)   Resp 16   Ht 6\' 2"  (1.88 m)   Wt (!) 378 lb (171.5 kg)   SpO2 95%   BMI 48.53 kg/m   Wt Readings from Last 3 Encounters:  07/07/16 (!) 378 lb (171.5 kg)  05/26/16 (!) 379 lb (171.9 kg)  07/18/14 (!) 353 lb (160.1 kg)   Physical Exam  Constitutional: He is oriented to person, place, and time. He appears well-developed and well-nourished.  HENT:  Mouth/Throat: Oropharynx is clear and moist.  Neck: Normal range of motion. Neck supple.  Cardiovascular: Normal rate, regular rhythm and intact distal pulses.  Exam reveals no gallop and no friction rub.   No murmur heard. Pulmonary/Chest: No respiratory distress. He has no wheezes. He has no rales.  Abdominal: Soft. Bowel sounds are normal. He exhibits no distension and no mass. There is no tenderness.  Neurological: He is alert and oriented  to person, place, and time.  Skin: Skin is warm and dry.   Assessment and Plan :   1. Controlled type 2 diabetes mellitus with other ophthalmic complication, without long-term current use of insulin (HCC) 2. Other fatigue 3. Morbid obesity, unspecified obesity type (HCC) - Last A1c showed well controlled diabetes. Sleep study is pending for his fatigue. For now, counseled on managing diabetes through diet only.   4. Elevated LFTs - Labs pending.  5. Tobacco use disorder - Will review smoking cessation with patient with labs follow up.  Wallis BambergMario Ethanael Veith, PA-C [facility] [phone] 07/07/2016 4:26 PM

## 2016-07-07 NOTE — Patient Instructions (Signed)
Diabetes Mellitus and Food It is important for you to manage your blood sugar (glucose) level. Your blood glucose level can be greatly affected by what you eat. Eating healthier foods in the appropriate amounts throughout the day at about the same time each day will help you control your blood glucose level. It can also help slow or prevent worsening of your diabetes mellitus. Healthy eating may even help you improve the level of your blood pressure and reach or maintain a healthy weight.  General recommendations for healthful eating and cooking habits include:  Eating meals and snacks regularly. Avoid going long periods of time without eating to lose weight.  Eating a diet that consists mainly of plant-based foods, such as fruits, vegetables, nuts, legumes, and whole grains.  Using low-heat cooking methods, such as baking, instead of high-heat cooking methods, such as deep frying. Work with your dietitian to make sure you understand how to use the Nutrition Facts information on food labels. HOW CAN FOOD AFFECT ME? Carbohydrates Carbohydrates affect your blood glucose level more than any other type of food. Your dietitian will help you determine how many carbohydrates to eat at each meal and teach you how to count carbohydrates. Counting carbohydrates is important to keep your blood glucose at a healthy level, especially if you are using insulin or taking certain medicines for diabetes mellitus. Alcohol Alcohol can cause sudden decreases in blood glucose (hypoglycemia), especially if you use insulin or take certain medicines for diabetes mellitus. Hypoglycemia can be a life-threatening condition. Symptoms of hypoglycemia (sleepiness, dizziness, and disorientation) are similar to symptoms of having too much alcohol.  If your health care provider has given you approval to drink alcohol, do so in moderation and use the following guidelines:  Women should not have more than one drink per day, and men  should not have more than two drinks per day. One drink is equal to:  12 oz of beer.  5 oz of wine.  1 oz of hard liquor.  Do not drink on an empty stomach.  Keep yourself hydrated. Have water, diet soda, or unsweetened iced tea.  Regular soda, juice, and other mixers might contain a lot of carbohydrates and should be counted. WHAT FOODS ARE NOT RECOMMENDED? As you make food choices, it is important to remember that all foods are not the same. Some foods have fewer nutrients per serving than other foods, even though they might have the same number of calories or carbohydrates. It is difficult to get your body what it needs when you eat foods with fewer nutrients. Examples of foods that you should avoid that are high in calories and carbohydrates but low in nutrients include:  Trans fats (most processed foods list trans fats on the Nutrition Facts label).  Regular soda.  Juice.  Candy.  Sweets, such as cake, pie, doughnuts, and cookies.  Fried foods. WHAT FOODS CAN I EAT? Eat nutrient-rich foods, which will nourish your body and keep you healthy. The food you should eat also will depend on several factors, including:  The calories you need.  The medicines you take.  Your weight.  Your blood glucose level.  Your blood pressure level.  Your cholesterol level. You should eat a variety of foods, including:  Protein.  Lean cuts of meat.  Proteins low in saturated fats, such as fish, egg whites, and beans. Avoid processed meats.  Fruits and vegetables.  Fruits and vegetables that may help control blood glucose levels, such as apples, mangoes, and   yams.  Dairy products.  Choose fat-free or low-fat dairy products, such as milk, yogurt, and cheese.  Grains, bread, pasta, and rice.  Choose whole grain products, such as multigrain bread, whole oats, and brown rice. These foods may help control blood pressure.  Fats.  Foods containing healthful fats, such as nuts,  avocado, olive oil, canola oil, and fish. DOES EVERYONE WITH DIABETES MELLITUS HAVE THE SAME MEAL PLAN? Because every person with diabetes mellitus is different, there is not one meal plan that works for everyone. It is very important that you meet with a dietitian who will help you create a meal plan that is just right for you.   This information is not intended to replace advice given to you by your health care provider. Make sure you discuss any questions you have with your health care provider.   Document Released: 08/03/2005 Document Revised: 11/27/2014 Document Reviewed: 10/03/2013 Elsevier Interactive Patient Education 2016 Elsevier Inc.  

## 2016-07-08 LAB — HEMOGLOBIN A1C
HEMOGLOBIN A1C: 6.7 % — AB (ref <5.7)
MEAN PLASMA GLUCOSE: 146 mg/dL

## 2016-07-12 ENCOUNTER — Encounter: Payer: Self-pay | Admitting: Neurology

## 2016-07-12 ENCOUNTER — Ambulatory Visit (INDEPENDENT_AMBULATORY_CARE_PROVIDER_SITE_OTHER): Payer: BLUE CROSS/BLUE SHIELD | Admitting: Neurology

## 2016-07-12 VITALS — BP 116/62 | HR 76 | Resp 18 | Ht 74.0 in | Wt 378.0 lb

## 2016-07-12 DIAGNOSIS — R351 Nocturia: Secondary | ICD-10-CM

## 2016-07-12 DIAGNOSIS — R519 Headache, unspecified: Secondary | ICD-10-CM

## 2016-07-12 DIAGNOSIS — R51 Headache: Secondary | ICD-10-CM | POA: Diagnosis not present

## 2016-07-12 DIAGNOSIS — R0683 Snoring: Secondary | ICD-10-CM | POA: Diagnosis not present

## 2016-07-12 DIAGNOSIS — G4710 Hypersomnia, unspecified: Secondary | ICD-10-CM | POA: Diagnosis not present

## 2016-07-12 NOTE — Progress Notes (Signed)
Subjective:    Patient ID: Evan Hamilton is a 50 y.o. male.  HPI     Huston Foley, MD, PhD [facility] [address] [address] [place], [place] [address]  Dear [name],   I saw your patient, Evan Hamilton, upon your kind request in my neurologic clinic today for initial consultation of his sleep disorder, in particular, concern for underlying obstructive sleep apnea. The patient is unaccompanied today. As you know, Mr. 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 I reviewed your office note from 07/07/2016. A1c on 07/07/16 was borderline at 6.7. He was on metformin in the past and had SEs, including fatigue and sleepiness and felt bad on it.  He has a 4 month Hx of EDS, lack of motivation, lack of initiative and depressed mood at times.  He has not been treated for depression.  He lives alone, about a week or 2 ago his sister's family moved in with him.  He works for a [employer], smokes some cigarettes, drink beer occasionally, and drinks caffeine occasionally.  He has no FHx of OSA. Denies RLS symptoms, but was told, that he snores.  He is single, has no children. He goes to bed between 10 PM and 11 PM normally, sometimes as late as midnight. Wake time varies a little bit. He does not wake up rested, Epworth sleepiness score is 15 out of 24 today, fatigue score is 51 out of 63. He has had weight gain in the past year and a half. He watches TV in bed, sometimes TV stays on all night if he forgets to put it on a timer.  His Past Medical History Is Significant For: Past Medical History:  Diagnosis Date  . Allergy   . Anxiety and depression   . Diabetes mellitus without complication (HCC)     His Past Surgical History Is Significant For: Past Surgical History:   Procedure Laterality Date  . CHOLECYSTECTOMY N/A 07/22/2014   Procedure: LAPAROSCOPIC CHOLECYSTECTOMY WITH INTRAOPERATIVE CHOLANGIOGRAM;  Surgeon: Harriette Bouillon, MD;  Location: [facility] OR;  Service: General;  Laterality: N/A;    His Family History Is Significant For: Family History  Problem Relation Age of Onset  . Cancer Mother     Lung  . Diabetes Mother   . Heart disease Father   . Hyperlipidemia Father   . Hypertension Father   . Mental illness Sister   . Cancer Maternal Grandmother     Lung  . Diabetes Maternal Grandmother     His Social History Is Significant For: Social History   Social History  . Marital status: Single    Spouse name: N/A  . Number of children: 0  . Years of education: college   Social History Main Topics  . Smoking status: Current Every Day Smoker    Types: Cigars  . Smokeless tobacco: Never Used  . Alcohol use 1.5 oz/week    3 Standard drinks or equivalent per week  . Drug use: No  . Sexual activity: Not Asked   Other Topics Concern  . None   Social History Narrative   Drinks "some" soda and a "couple" of 5 hour energy drinks a week.     His Allergies Are:  Allergies  Allergen Reactions  . Penicillins Hives and Rash  :   His Current Medications Are:  Outpatient Encounter Prescriptions as of 07/12/2016  Medication Sig  . Cyanocobalamin (VITAMIN B-12 PO) Take 1 tablet by mouth daily.  . Multiple Vitamin (MULTIVITAMIN) tablet Take 1 tablet by mouth daily.  . Probiotic Product (PROBIOTIC PO) Take 1 tablet by mouth daily.   No facility-administered encounter medications on file as of 07/12/2016.   :  Review of Systems:  Out of a complete 14 point review of systems, all are reviewed and negative with the exception of these symptoms as listed below:  Review of Systems  Constitutional: Positive for fatigue.  Neurological: Positive for headaches.       Patient has never had a sleep study.  Patient wakes up a couple of times during the  night, snoring, wakes up feeling tired, headaches, daytime tiredness, falls asleep when sitting still.   Psychiatric/Behavioral: Positive for confusion.       Depression, anxiety, not enough sleep, decreased energy, disinterest in activities, racing thoughts  Epworth Sleepiness Scale 0= would never doze 1= slight chance of dozing 2= moderate chance of dozing 3= high chance of dozing  Sitting and reading:2 Watching TV:3 Sitting inactive in a public place (ex. Theater or meeting):2 As a passenger in a car for an hour without a break:2 Lying down to rest in the afternoon:2 Sitting and talking to someone:1 Sitting quietly after lunch (no alcohol):2 In a car, while stopped in traffic:1 Total:15   Objective:  Neurologic Exam  Physical Exam Physical Examination:   Vitals:   07/12/16 1150  BP: 116/62  Pulse: 76  Resp: 18    General Examination: The patient is a very pleasant 50 y.o. male in no acute distress. He appears well-developed and well-nourished and adequately groomed.   HEENT: Normocephalic, atraumatic, pupils are equal, round and reactive to light and accommodation. Funduscopic exam is normal with sharp disc margins noted. Extraocular tracking is good without limitation to gaze excursion or nystagmus noted. Normal smooth pursuit is noted. Hearing is grossly intact. Tympanic membranes are clear bilaterally. Face is symmetric with normal facial animation and normal facial sensation. Speech is clear with no dysarthria noted. There is no hypophonia. There is no lip, neck/head, jaw or voice tremor. Neck is supple with full range of passive and active motion. There are no carotid bruits on auscultation. Oropharynx exam reveals: mild mouth dryness, adequate dental hygiene and moderate airway crowding, due to smaller airway entry and larger appearing tongue. Mallampati is class II. Tongue protrudes centrally and palate elevates symmetrically. Tonsils are 1+ in size. Neck size is 19 1/8  inches. He has a Mild overbite. Nasal inspection reveals no significant nasal mucosal bogginess or redness and no septal deviation.   Chest: Clear to auscultation without wheezing, rhonchi or crackles noted.  Heart: S1+S2+0, regular and normal without murmurs, rubs or gallops noted.   Abdomen: Soft, non-tender and non-distended with normal bowel sounds appreciated on auscultation.  Extremities: There is trace edema around the ankles.   Skin: Warm and dry without trophic changes noted. There are no varicose veins.  Musculoskeletal: exam reveals no obvious joint deformities, tenderness or joint swelling or erythema.   Neurologically:  Mental status: The patient is awake, alert and oriented in all 4 spheres. His immediate and remote memory, attention, language skills and fund of knowledge are appropriate. There is no evidence of aphasia, agnosia, apraxia or anomia. Speech is clear with normal prosody and enunciation. Thought process is linear. Mood is normal and affect is normal.  Cranial nerves II - XII are as  described above under HEENT exam. In addition: shoulder shrug is normal with equal shoulder height noted. Motor exam: Normal bulk, strength and tone is noted. There is no drift, tremor or rebound. Romberg is negative. Reflexes are 2+ throughout. Fine motor skills and coordination: intact with normal finger taps, normal hand movements, normal rapid alternating patting, normal foot taps and normal foot agility.  Cerebellar testing: No dysmetria or intention tremor on finger to nose testing. Heel to shin is difficult d/t . There is no truncal or gait ataxia.  Sensory exam: intact to light touch, pinprick, vibration, temperature sense in the upper and lower extremities, except mild decrease in PP in the distal LEs.  Gait, station and balance: He stands easily. No veering to one side is noted. No leaning to one side is noted. Posture is age-appropriate and stance is narrow based. Gait shows normal  stride length and normal pace. No problems turning are noted. Tandem walk is unremarkable.   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 I had a long chat with the patient about my findings and the diagnosis of OSA, its prognosis and treatment options. We talked about medical treatments, surgical interventions and non-pharmacological approaches. I explained in particular the risks and ramifications of untreated moderate to severe OSA, especially with respect to developing cardiovascular disease down the Road, including congestive heart failure, difficult to treat hypertension, cardiac arrhythmias, or stroke. Even type 2 diabetes has, in part, been linked to untreated OSA. Symptoms of untreated OSA include daytime sleepiness, memory problems, mood irritability and mood disorder such as depression and anxiety, lack of energy, as well as recurrent headaches, especially morning headaches. We talked about smoking cessation and trying to maintain a  healthy lifestyle in general, as well as the importance of weight control. I encouraged the patient to eat healthy, exercise daily and keep well hydrated, to keep a scheduled bedtime and wake time routine, to not skip any meals and eat healthy snacks in between meals. I advised the patient not to drive when feeling sleepy.  We talked about improvement of sleep hygiene. In addition, he is advised to bring up the issue with depression with you as well.  I recommended the following at this time: sleep study with potential positive airway pressure titration. (We will score hypopneas at 3% and split the sleep study into diagnostic and treatment portion, if the estimated. 2 hour AHI is >15/h).   I explained the sleep test  procedure to the patient and also outlined possible surgical and non-surgical treatment options of OSA, including the use of a custom-made dental device (which would require a referral to a specialist dentist or oral surgeon), upper airway surgical options, such as pillar implants, radiofrequency surgery, tongue base surgery, and UPPP (which would involve a referral to an ENT surgeon). Rarely, jaw surgery such as mandibular advancement may be considered.  I also explained the CPAP treatment option to the patient, who indicated that he would be willing to try CPAP if the need arises. I explained the importance of being compliant with PAP treatment, not only for insurance purposes but primarily to improve His symptoms, and for the patient's long term health benefit, including to reduce His cardiovascular risks. I answered all his questions today and  the patient was in agreement. I would like to see him back after the sleep study is completed and encouraged him to call with any interim questions, concerns, problems or updates.   Thank you very much for allowing me to participate in the care of this nice patient. If I can be of any further assistance to you please do not hesitate to call me at [phone].  Sincerely,   Huston FoleySaima Quinnlan Abruzzo, MD, PhD

## 2016-07-12 NOTE — Patient Instructions (Signed)

## 2016-07-31 ENCOUNTER — Ambulatory Visit (INDEPENDENT_AMBULATORY_CARE_PROVIDER_SITE_OTHER): Payer: BLUE CROSS/BLUE SHIELD | Admitting: Physician Assistant

## 2016-07-31 VITALS — BP 108/70 | HR 85 | Temp 97.5°F | Resp 18 | Ht 74.0 in | Wt 381.4 lb

## 2016-07-31 DIAGNOSIS — R21 Rash and other nonspecific skin eruption: Secondary | ICD-10-CM

## 2016-07-31 DIAGNOSIS — R7982 Elevated C-reactive protein (CRP): Secondary | ICD-10-CM

## 2016-07-31 DIAGNOSIS — R700 Elevated erythrocyte sedimentation rate: Secondary | ICD-10-CM

## 2016-07-31 LAB — URIC ACID: Uric Acid, Serum: 7.2 mg/dL (ref 4.0–8.0)

## 2016-07-31 MED ORDER — PREDNISONE 20 MG PO TABS: ORAL_TABLET | ORAL | 0 refills | Status: AC

## 2016-07-31 NOTE — Patient Instructions (Signed)
     IF you received an x-ray today, you will receive an invoice from [facility]. Please contact [facility] at [phone] with questions or concerns regarding your invoice.   IF you received labwork today, you will receive an invoice from [facility]. Please contact [facility] at [phone] with questions or concerns regarding your invoice.   Our billing staff will not be able to assist you with questions regarding bills from these companies.  You will be contacted with the lab results as soon as they are available. The fastest way to get your results is to activate your My Chart account. Instructions are located on the last page of this paperwork. If you have not heard from us regarding the results in 2 weeks, please contact this office.      

## 2016-07-31 NOTE — Progress Notes (Signed)
07/31/2016 2:56 PM   DOB: [birth year] / MRN: [id number]  SUBJECTIVE:  Evan Hamilton is a 50 y.o. male presenting for recheck of rash.  This has been an ongoing problem.  Reports the rash to be itchy and come with redness and swelling, or hardness, of his skin.  Denies any lip, throat, tongue swelling. Has seen derm an had a punch which was unhelpful. Took prednisone and was better for a while.  No new foods, meds.  Does not take ACE/ARB.  No abdominal pain.  Associates some joint pain if the swelling becomes severe.   He has dogs but no new dogs and no new products.   He is allergic to penicillins.   He  has a past medical history of Allergy; Anxiety and depression; and Diabetes mellitus without complication (HCC).    He  reports that he has been smoking Cigars.  He has never used smokeless tobacco. He reports that he drinks about 1.5 oz of alcohol per week . He reports that he does not use drugs. He  has no sexual activity history on file. The patient  has a past surgical history that includes Cholecystectomy (N/A, 07/22/2014).  His family history includes Cancer in his maternal grandmother and mother; Diabetes in his maternal grandmother and mother; Heart disease in his father; Hyperlipidemia in his father; Hypertension in his father; Mental illness in his sister.  Review of Systems  Constitutional: Negative for chills and fever.  Respiratory: Negative for cough.   Cardiovascular: Negative for chest pain.  Gastrointestinal: Negative for nausea.  Musculoskeletal: Negative for joint pain and myalgias.  Skin: Positive for itching and rash.  Neurological: Negative for dizziness and headaches.  Psychiatric/Behavioral: Negative for depression.    The problem list and medications were reviewed and updated by myself where necessary and exist elsewhere in the encounter.   OBJECTIVE:  BP 108/70   Pulse 85   Temp 97.5 F (36.4 C) (Oral)   Resp 18   Ht 6\' 2"  (1.88 m)   Wt (!) 381 lb 6.4 oz  (173 kg)   SpO2 97%   BMI 48.97 kg/m   Physical Exam  Constitutional: He is oriented to person, place, and time. He appears well-developed. He does not appear ill.  Eyes: Conjunctivae and EOM are normal. Pupils are equal, round, and reactive to light.  Cardiovascular: Normal rate.   Pulmonary/Chest: [facility] normal.  Abdominal: He exhibits no distension.  Musculoskeletal: Normal range of motion.  Neurological: He is alert and oriented to person, place, and time. No cranial nerve deficit. Coordination normal.  Skin: Skin is warm and dry. Rash (Multiple, erhtematous, hive like, some with mild induration.  None with tenderness. ) noted. He is not diaphoretic.  Psychiatric: He has a normal mood and affect.  Nursing note and vitals reviewed.   No results found for this or any previous visit (from the past 72 hour(s)).  No results found.  ASSESSMENT AND PLAN  Evan Hamilton was seen today for follow-up.  Diagnoses and all orders for this visit:  Rash and nonspecific skin eruption: Old problem.  Will provide steroids as this has worked in the past.  Will complete work up to rule out a rheum problem.  Will plan to refer to either rheum or allergy if complement problem or remains undifferentiated.  -     ANA -     C-reactive protein -     Cancel: C3 and C4 -     Uric Acid -  Rheumatoid factor -     predniSONE (DELTASONE) 20 MG tablet; Take 3 in the morning for 3 days, then 2 in the morning for 3 days, and then 1 in the morning for 3 days. -     C3 and C4    The patient is advised to call or return to clinic if he does not see an improvement in symptoms, or to seek the care of the closest emergency department if he worsens with the above plan.   Deliah Boston, MHS, PA-C [facility] 07/31/2016 2:56 PM

## 2016-08-01 LAB — ANA: Anti Nuclear Antibody(ANA): NEGATIVE

## 2016-08-01 LAB — C3 AND C4
C3 COMPLEMENT: 169 mg/dL (ref 90–180)
C4 Complement: 45 mg/dL (ref 16–47)

## 2016-08-01 LAB — RHEUMATOID FACTOR

## 2016-08-01 LAB — C-REACTIVE PROTEIN: CRP: 26.9 mg/L — AB (ref <8.0)

## 2016-08-01 NOTE — Progress Notes (Signed)
Please give him a call and advise that I am getting him into rheumatology.  I don't have a diagnosis however his sed rate is quite elevated.  I am placing a referral based of of that lab.  Thank you for contacting him. Deliah BostonMichael Lanesha Azzaro, MS, PA-C 6:20 PM, 08/01/2016

## 2016-08-01 NOTE — Addendum Note (Signed)
Addended by: Ofilia NeasLARK, MICHAEL L on: 08/01/2016 06:21 PM   Modules accepted: Orders

## 2016-08-15 DIAGNOSIS — L309 Dermatitis, unspecified: Secondary | ICD-10-CM | POA: Diagnosis not present

## 2016-08-15 DIAGNOSIS — E118 Type 2 diabetes mellitus with unspecified complications: Secondary | ICD-10-CM | POA: Diagnosis not present

## 2016-08-15 DIAGNOSIS — R7989 Other specified abnormal findings of blood chemistry: Secondary | ICD-10-CM | POA: Diagnosis not present

## 2016-08-15 DIAGNOSIS — B354 Tinea corporis: Secondary | ICD-10-CM | POA: Diagnosis not present

## 2016-08-18 ENCOUNTER — Ambulatory Visit (INDEPENDENT_AMBULATORY_CARE_PROVIDER_SITE_OTHER): Payer: BLUE CROSS/BLUE SHIELD | Admitting: Neurology

## 2016-08-18 DIAGNOSIS — G4720 Circadian rhythm sleep disorder, unspecified type: Secondary | ICD-10-CM

## 2016-08-18 DIAGNOSIS — G4733 Obstructive sleep apnea (adult) (pediatric): Secondary | ICD-10-CM | POA: Diagnosis not present

## 2016-08-18 DIAGNOSIS — G4761 Periodic limb movement disorder: Secondary | ICD-10-CM

## 2016-08-25 ENCOUNTER — Telehealth: Payer: Self-pay | Admitting: Neurology

## 2016-08-30 ENCOUNTER — Telehealth: Payer: Self-pay | Admitting: Neurology

## 2016-08-30 DIAGNOSIS — G4761 Periodic limb movement disorder: Secondary | ICD-10-CM

## 2016-08-30 DIAGNOSIS — G4720 Circadian rhythm sleep disorder, unspecified type: Secondary | ICD-10-CM

## 2016-08-30 DIAGNOSIS — G4733 Obstructive sleep apnea (adult) (pediatric): Secondary | ICD-10-CM

## 2016-08-30 DIAGNOSIS — B354 Tinea corporis: Secondary | ICD-10-CM | POA: Diagnosis not present

## 2016-08-30 NOTE — Telephone Encounter (Signed)
I spoke to patient and he is aware of results and recommendations. He is willing to proceed with titration study. I will send report to PCP.   

## 2016-08-30 NOTE — Telephone Encounter (Signed)
Patient referred by Wallis BambergMario Mani, seen by me on 07/12/16, diagnostic PSG on 08/18/16.   Please call and notify the patient that the recent sleep study did confirm the diagnosis of obstructive sleep apnea and that I recommend treatment for this in the form of CPAP. This will require a repeat sleep study for proper titration and mask fitting. Please explain to patient and arrange for a CPAP titration study. I have placed an order in the chart. Thanks, and [facility].  Huston FoleySaima Mani Celestin, MD, PhD [facility]

## 2016-08-30 NOTE — Progress Notes (Signed)
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 The patient endorsed the Epworth Sleepiness Scale at 15 points. The patient's weight 377 pounds with a height of 74 (inches), resulting in a BMI of 48.4 kg/m2. The patient's neck circumference measured 19 inches.  CURRENT MEDICATIONS: Vitamin B-12, Multivitamin, Probiotic   PROCEDURE:  This is a multichannel digital polysomnogram utilizing the Somnostar 11.2 system.  Electrodes and sensors were applied and monitored per AASM Specifications.   EEG, EOG, Chin and Limb EMG, were sampled at 200 Hz.  ECG, Snore and Nasal Pressure, Thermal Airflow, Respiratory Effort, CPAP Flow and Pressure, Oximetry was sampled at 50 Hz. Digital video and audio were recorded.      BASELINE STUDY  Lights Out was at 22:09 and Lights On at 02:53.  Total recording time (TRT) was 284, with a total sleep time (TST) of 194 minutes.   The patient's sleep latency was 54.5 minutes.  REM latency was 97 minutes, which is normal.  The sleep efficiency was 68.3 %, which is reduced.     SLEEP ARCHITECTURE: Wake after sleep onset was 41.5 minutes, Stage N1 4.9%, Stage N2 37.9%, Stage N3 48.2% and Stage R (REM sleep) 9.%, which is reduced.   The video and audio analysis did not show any abnormal or  unusual behaviors, movements, or vocalizations.   The EKG showed NSR.   Mild to moderate snoring was noted.  RESPIRATORY ANALYSIS:  There were a total of 38 respiratory events:  8 obstructive apneas, 0 central apneas and4 mixed apneas with a total of 12 apneas and an apnea index (AI) of 3.7. There were 26 hypopneas with a hypopnea  index of 8.. The patient also had 0 respiratory event related arousals (RERAs).      The total APNEA/HYPOPNEA INDEX (AHI) was 11.8 and the total RESPIRATORY DISTURBANCE INDEX was 11.8.  16 events occurred in REM sleep and 32 events in NREM. The REM AHI was 54.9, versus a non-REM AHI of 7.5. The patient spent 58% of total sleep time in the supine position. The supine AHI was 16.0 versus a non-supine AHI of 5.9.  OXYGEN SATURATION & C02:  The baseline 02 saturation was 96%, with the lowest being 78%. Time spent below 89% saturation equaled 14 minutes.   PERIODIC LIMB MOVEMENTS:   The patient had a total of 78 Periodic Limb Movements.  The Periodic Limb Movement (PLM) index was 24.1 and the PLM Arousal index was 2.5.   IMPRESSION:  1.  Obstructive Sleep Apnea  2.  Periodic Limb Movement Disorder  3.  Dysfunctions associated with Sleep stages or arousal from sleep   RECOMMENDATIONS:  1.  This study demonstrates overall mild obstructive sleep apnea, severe in REM sleep with a total AHI of 11.8/hour, REM AHI of 54.9/hour, and O2 nadir of 78%. Given the patient's medical history and sleep related complaints, a full-night CPAP titration study is recommended to optimize therapy. Other treatment options may include avoidance of supine sleep position along with weight loss, upper airway or jaw surgery in selected patients or the use of an oral appliance in certain patients. ENT  evaluation and/or consultation with a maxillofacial surgeon or dentist may be feasible and some instances.    2.  The patient should be cautioned not to drive, work at heights, or operate dangerous or heavy equipment when tired or sleepy. Review and reiteration of good sleep hygiene measures should be pursued with any patient. 3.  Mild PLMs (periodic limb movements) were noted without significant arousals; clinical correlation is recommended.  4.  This study shows sleep fragmentation and abnormal sleep stage percentages; these are  nonspecific findings and per se do not signify an intrinsic sleep disorder or a cause for the patient's sleep-related symptoms. Causes include (but are not limited to) the first night effect of the sleep study, circadian rhythm disturbances, medication effect or an underlying mood disorder or medical problem.  5. The patient will be seen in follow-up by Dr. Frances Furbish at [facility] for discussion of the test results and further management strategies. The referring provider will be notified of the test results.  I certify that I have reviewed the entire raw data recording prior to the issuance of this report in accordance with the Standards of Accreditation of the American Academy of Sleep Medicine (AASM)       Huston Foley, MD, PhD Diplomat, American Board of Psychiatry and Neurology  Diplomat, American Board of Sleep Medicine

## 2016-09-27 DIAGNOSIS — B354 Tinea corporis: Secondary | ICD-10-CM | POA: Diagnosis not present

## 2016-09-27 DIAGNOSIS — G4730 Sleep apnea, unspecified: Secondary | ICD-10-CM | POA: Diagnosis not present

## 2016-09-27 DIAGNOSIS — J069 Acute upper respiratory infection, unspecified: Secondary | ICD-10-CM | POA: Diagnosis not present

## 2016-09-28 DIAGNOSIS — L309 Dermatitis, unspecified: Secondary | ICD-10-CM | POA: Diagnosis not present

## 2016-10-06 ENCOUNTER — Ambulatory Visit (INDEPENDENT_AMBULATORY_CARE_PROVIDER_SITE_OTHER): Payer: BLUE CROSS/BLUE SHIELD | Admitting: Neurology

## 2016-10-06 DIAGNOSIS — G4733 Obstructive sleep apnea (adult) (pediatric): Secondary | ICD-10-CM

## 2016-10-06 DIAGNOSIS — G4761 Periodic limb movement disorder: Secondary | ICD-10-CM

## 2016-10-20 NOTE — Addendum Note (Signed)
Addended by: Huston FoleyATHAR, Karna Abed on: 10/20/2016 10:22 AM   Modules accepted: Orders

## 2016-10-20 NOTE — Progress Notes (Signed)
Patient referred by Evan Hamilton, seen by me on 07/12/16, diagnostic PSG on 08/18/16, CPAP study on 10/06/16:  Please call and inform patient that I have entered an order for treatment with positive airway pressure (PAP) treatment of obstructive sleep apnea (OSA). He did well during the latest sleep study with CPAP. We will, therefore, arrange for a machine for home use through a DME (durable medical equipment) company of His choice; and I will see the patient back in follow-up in about 8-10 weeks. Please also explain to the patient that I will be looking out for compliance data, which can be downloaded from the machine (stored on an SD card, that is inserted in the machine) or via remote access through a modem, that is built into the machine. At the time of the followup appointment we will discuss sleep study results and how it is going with PAP treatment at home. Please advise patient to bring His machine at the time of the first FU visit, even though this is cumbersome. Bringing the machine for every visit after that will likely not be needed, but often helps for the first visit to troubleshoot if needed. Please re-enforce the importance of compliance with treatment and the need for us to monitor compliance data - often an insurance requirement and actually good feedback for the patient as far as how they are doing.  Also remind patient, that any interim PAP machine or mask issues should be first addressed with the DME company, as they can often help better with technical and mask fit issues. Please ask if patient has a preference regarding DME company.  Please also make sure, the patient has a follow-up appointment with me in about 8-10 weeks from the setup date, thanks.  Once you have spoken to the patient - and faxed/routed report to PCP and referring MD (if other than PCP), you can close this encounter, thanks,   Huston FoleySaima Jeneva Schweizer, MD, PhD [facility]

## 2016-10-20 NOTE — Procedures (Signed)
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 He had diagnostic sleep study on 08/18/16, which showed an AHI of 11.8/hour, REM AHI of 54.9/hour, and O2 nadir of 78%. The patient endorsed the Epworth Sleepiness Scale at 15 points and the Fatigue Score at 51 points. The patient's weight 377 pounds with a height of 74 (inches), resulting in a BMI of 48.4 kg/m2. The patient's neck circumference measured 19 inches.  CURRENT MEDICATIONS: Vitamin B-12, Multivitamin, Probiotic    PROCEDURE:  This is a multichannel digital polysomnogram utilizing the SomnoStar 11.2 system.  Electrodes and sensors were applied and monitored per AASM Specifications.   EEG, EOG, Chin and Limb EMG, were sampled at 200 Hz.  ECG, Snore and Nasal Pressure, Thermal Airflow, Respiratory Effort, CPAP Flow and Pressure, Oximetry was sampled at 50 Hz. Digital video and audio were recorded.      CPAP was initiated at 5 cmH20 with heated humidity per AASM split night standards and pressure was advanced to 14 cmH20 because of hypopneas, apneas and desaturations.  At a PAP pressure of 12 cmH20, there was a reduction of the AHI to 2.6 per hour, O2 nadir was 91% and brief supine REM sleep was achieved.   Lights Out was at 20:51 and Lights On at 05:01. Total recording time (TRT) was 491 minutes, with a total sleep time (TST) of 384.5 minutes. The patient's sleep latency was 25 minutes with 2.5 minutes of wake time after sleep onset. REM latency was 124 minutes, which was borderline increased.  The sleep efficiency was 78.3 %.    SLEEP ARCHITECTURE: WASO (Wake after sleep onset)  was 88 minutes  with moderate sleep fragmentation noted.  There were 14 minutes in Stage N1, 200.5 minutes Stage N2, 116 minutes Stage N3 and 54 minutes in Stage REM.  The percentage of Stage N1 was 3.6%, Stage N2 was 52.1%, Stage N3 was 30.2%, which is increased and Stage R (REM sleep) was 14%.   Audio and video analysis did not show any abnormal or unusual movements, behaviors, phonations or vocalizations.  The patient took no bathroom breaks.  EKG was in keeping with normal sinus rhythm (NSR).  RESPIRATORY ANALYSIS:  There was a total of 36 respiratory events: 2 obstructive apneas, 1 central apneas and 1 mixed apneas with a total of 4 apneas and an apnea index (AI) of .6 /hour. There were 32 hypopneas with a hypopnea index of 5./hour. The patient also had 1 respiratory event related arousals (RERAs).      The total APNEA/HYPOPNEA INDEX  (AHI) was 5.6 /hour and the total RESPIRATORY DISTURBANCE INDEX was 5.8 .hour  15 events occurred in REM sleep and 21 events in NREM. The REM AHI was 16.7 /hour versus a non-REM AHI of 3.8 /hour.  The patient spent 321.5 minutes of total sleep time in the supine position and 63 minutes in non-supine. The supine AHI was 5.8, versus a non-supine AHI of 4.8.  OXYGEN SATURATION & C02:  The baseline 02 saturation was 94%, with the lowest being 83%. Time spent below 89% saturation equaled 2 minutes.  PERIODIC LIMB MOVEMENTS:    The patient had a total of 313 Periodic Limb Movements. The Periodic Limb Movement (PLM) index was 48.8 and the PLM Arousal index was 1.4 /hour.  Post-study, the patient indicated that sleep was the same as usual.   The Technologist noted the patient was fitted with a Small Simplus FFM apparatus, which he preferred over the nasal mask.  DIAGNOSIS 1. Obstructive Sleep Apnea (OSA)  2. Periodic Limb Movement Disorder (PLMD)  PLANS/RECOMMENDATIONS: 1. This study demonstrates near-resolution of the patient's obstructive sleep apnea with CPAP therapy. I will,  therefore, start the patient on home CPAP treatment at a pressure of 12 cm via small FFM with heated humidity. The patient should be reminded to be fully compliant with PAP therapy to improve sleep related symptoms and decrease long term cardiovascular risks. The patient should be reminded, that it may take up to 3 months to get fully used to using PAP with all planned sleep. The earlier full compliance is achieved, the better long term compliance tends to be. Please note that untreated obstructive sleep apnea carries additional perioperative morbidity. Patients with significant obstructive sleep apnea should receive perioperative PAP therapy and the surgeons and particularly the anesthesiologist should be informed of the diagnosis and the severity of the sleep disordered breathing. 2. Moderate PLMs (periodic limb movements of sleep) were noted during the study without significant arousals; clinical correlation is recommended.  3. The patient should be cautioned not to drive, work at heights, or operate dangerous or heavy equipment when tired or sleepy. Review and reiteration of good sleep hygiene measures should be pursued with any patient. 4. The patient [facility] for discussion of the test results and further management strategies. The referring provider will be notified of the test results.  I certify that I have reviewed the entire raw data recording prior to the issuance of this report in accordance with the Standards of Accreditation of the American Academy of Sleep Medicine (AASM).     Huston FoleySaima Corie Allis, MD, PhD Diplomat, American Board of Psychiatry and Neurology (Neurology and Sleep Medicine)

## 2016-10-24 ENCOUNTER — Telehealth: Payer: Self-pay

## 2016-10-24 NOTE — Telephone Encounter (Signed)
-----   Message from Huston FoleySaima Athar, MD sent at 10/20/2016 10:22 AM EST ----- Patient referred by Wallis BambergMario Mani, seen by me on 07/12/16, diagnostic PSG on 08/18/16, CPAP study on 10/06/16:  Please call and inform patient that I have entered an order for treatment with positive airway pressure (PAP) treatment of obstructive sleep apnea (OSA). He did well during the latest sleep study with CPAP. We will, therefore, arrange for a machine for home use through a DME (durable medical equipment) company of His choice; and I will see the patient back in follow-up in about 8-10 weeks. Please also explain to the patient that I will be looking out for compliance data, which can be downloaded from the machine (stored on an SD card, that is inserted in the machine) or via remote access through a modem, that is built into the machine. At the time of the followup appointment we will discuss sleep study results and how it is going with PAP treatment at home. Please advise patient to bring His machine at the time of the first FU visit, even though this is cumbersome. Bringing the machine for every visit after that will likely not be needed, but often helps for the first visit to troubleshoot if needed. Please re-enforce the importance of compliance with treatment and the need for us to monitor compliance data - often an insurance requirement and actually good feedback for the patient as far as how they are doing.  Also remind patient, that any interim PAP machine or mask issues should be first addressed with the DME company, as they can often help better with technical and mask fit issues. Please ask if patient has a preference regarding DME company.  Please also make sure, the patient has a follow-up appointment with me in about 8-10 weeks from the setup date, thanks.  Once you have spoken to the patient - and faxed/routed report to PCP and referring MD (if other than PCP), you can close this encounter, thanks,   Huston FoleySaima Athar, MD,  PhD [facility]

## 2016-10-24 NOTE — Telephone Encounter (Signed)
I called patient and gave him results. Patient asked if he can call back for results, states that he is at work and cannot hear me. I have sent report to PCP.

## 2016-10-25 NOTE — Telephone Encounter (Signed)
Patient is calling back to discuss results.

## 2016-10-26 NOTE — Telephone Encounter (Signed)
I called patient back. He is aware of results and recommendations. He is willing to start treatment. I have sent report to PCP. I have sent orders to [facility]. Patient will receive a letter reminding him to make f/u appt and stress the importance of compliance.

## 2016-11-07 ENCOUNTER — Ambulatory Visit (INDEPENDENT_AMBULATORY_CARE_PROVIDER_SITE_OTHER): Payer: BLUE CROSS/BLUE SHIELD

## 2016-11-07 ENCOUNTER — Ambulatory Visit (INDEPENDENT_AMBULATORY_CARE_PROVIDER_SITE_OTHER): Payer: BLUE CROSS/BLUE SHIELD | Admitting: Family Medicine

## 2016-11-07 ENCOUNTER — Encounter: Payer: Self-pay | Admitting: Family Medicine

## 2016-11-07 VITALS — BP 132/82 | HR 90 | Temp 98.8°F | Resp 17 | Ht 72.5 in | Wt 392.0 lb

## 2016-11-07 DIAGNOSIS — M7989 Other specified soft tissue disorders: Secondary | ICD-10-CM | POA: Diagnosis not present

## 2016-11-07 DIAGNOSIS — E119 Type 2 diabetes mellitus without complications: Secondary | ICD-10-CM

## 2016-11-07 DIAGNOSIS — R238 Other skin changes: Secondary | ICD-10-CM | POA: Diagnosis not present

## 2016-11-07 DIAGNOSIS — S99922A Unspecified injury of left foot, initial encounter: Secondary | ICD-10-CM | POA: Diagnosis not present

## 2016-11-07 DIAGNOSIS — S90122A Contusion of left lesser toe(s) without damage to nail, initial encounter: Secondary | ICD-10-CM

## 2016-11-07 DIAGNOSIS — R200 Anesthesia of skin: Secondary | ICD-10-CM

## 2016-11-07 NOTE — Progress Notes (Signed)
Subjective:  By signing my name below, I, Stann Oresung-Kai Tsai, attest that this documentation has been prepared under the direction and in the presence of Meredith StaggersJeffrey Eri Mcevers, MD. Electronically Signed: Stann Oresung-Kai Tsai, Scribe. 11/07/2016 , 4:54 PM .  Patient was seen in Room 11 .   Patient ID: Evan PartyMark Burger, male    DOB: 02-25-1966, 50 y.o.   MRN: [id number] Chief Complaint  Patient presents with  . Toe Injury    left side    HPI Evan Hamilton is a 50 y.o. male  Patient states having 3rd toe injury which occurred either last night or 2 nights ago. He recalls accidentally bashing his left foot into his elliptical in the middle of the night when getting out of bed for nocturia; however, he doesn't recall if it was last night or 2 nights ago.   He also mentions having decreased sensation in his feet over past 10 years. He has history of diabetes. He had borderline A1C at last visit at 6.7 in Aug visit. He had A1C of 7.2 3 years ago. He notes the numbness goes up to just below his ankle. His toes are also clawing, due to "fitting his size-14 feet into size-12 shoes many years ago".   He had normal TSH on Aug 18th.   Patient Active Problem List   Diagnosis Date Noted  . Acute biliary pancreatitis 07/18/2014   Past Medical History:  Diagnosis Date  . Allergy   . Anxiety and depression   . Diabetes mellitus without complication [facility](HCC)    Past Surgical History:  Procedure Laterality Date  . CHOLECYSTECTOMY N/A 07/22/2014   Procedure: LAPAROSCOPIC CHOLECYSTECTOMY WITH INTRAOPERATIVE CHOLANGIOGRAM;  Surgeon: Harriette Bouillonhomas Cornett, MD;  Location: [facility] OR;  Service: General;  Laterality: N/A;   Allergies  Allergen Reactions  . Penicillins Hives and Rash   Prior to Admission medications   Medication Sig Start Date End Date Taking? Authorizing Provider  Cyanocobalamin (VITAMIN B-12 PO) Take 1 tablet by mouth daily.    Historical Provider, MD  Multiple Vitamin (MULTIVITAMIN) tablet Take 1 tablet by mouth  daily.    Historical Provider, MD  Probiotic Product (PROBIOTIC PO) Take 1 tablet by mouth daily.    Historical Provider, MD   Social History   Social History  . Marital status: Single    Spouse name: N/A  . Number of children: 0  . Years of education: college   Occupational History  . Not on file.   Social History Main Topics  . Smoking status: Current Every Day Smoker    Types: Cigars  . Smokeless tobacco: Never Used  . Alcohol use 1.5 oz/week    3 Standard drinks or equivalent per week  . Drug use: No  . Sexual activity: No   Other Topics Concern  . Not on file   Social History Narrative   Drinks "some" soda and a "couple" of 5 hour energy drinks a week.    Review of Systems  Constitutional: Negative for fatigue and unexpected weight change.  Eyes: Negative for visual disturbance.  Respiratory: Negative for cough, chest tightness and shortness of breath.   Cardiovascular: Negative for chest pain, palpitations and leg swelling.  Gastrointestinal: Negative for abdominal pain and blood in stool.  Musculoskeletal: Positive for arthralgias. Negative for gait problem, joint swelling and myalgias.  Skin: Negative for rash and wound.  Neurological: Negative for dizziness, light-headedness and headaches.       Objective:   Physical Exam  Constitutional: He is oriented to  person, place, and time. He appears well-developed and well-nourished. No distress.  HENT:  Head: Normocephalic and atraumatic.  Eyes: EOM are normal. Pupils are equal, round, and reactive to light.  Neck: Neck supple.  Cardiovascular: Normal rate.   Pulmonary/Chest: Effort normal. No respiratory distress.  Musculoskeletal: Normal range of motion.  Left foot: 3rd toe has an erythema, soft tissue swelling, looks like a bruising with 2 bullous areas over middle phalanx dorsum, and smaller area of medial proximal aspect, no wound, skin intact, decreased sensation in all toes; some clawing from 2nd through  5th toes, no focal tenderness  Neurological: He is alert and oriented to person, place, and time.  Skin: Skin is warm and dry.  Psychiatric: He has a normal mood and affect. His behavior is normal.  Nursing note and vitals reviewed.   Vitals:   11/07/16 1634  BP: 132/82  Pulse: 90  Resp: 17  Temp: 98.8 F (37.1 C)  TempSrc: Oral  SpO2: 95%  Weight: (!) 392 lb (177.8 kg)  Height: 6' 0.5" (1.842 m)   Dg Toe 3rd Left  Result Date: 11/07/2016 CLINICAL DATA:  Injury third toe EXAM: LEFT FOOT WITH PARTICULAR ATTENTION TO THIRD DIGIT:  3 V COMPARISON:  None. FINDINGS: Frontal, oblique, and lateral views were obtained. There are flexion deformities of the second, third, fourth, and fifth MTP and PIP joints. There is no acute fracture or dislocation. There is no appreciable joint space narrowing or erosion. No bony destruction. There are accessory ossicles medial to the medial cuneiform bone. There is calcification dorsal and anterior to the talus, possibly residua of old trauma. IMPRESSION: No acute fracture or dislocation. Flexion deformities of the second and third, fourth, and fifth MTP and PIP joints. No erosive change or bony destruction. No appreciable joint space narrowing. Electronically Signed   By: Bretta BangWilliam  Woodruff III M.D.   On: 11/07/2016 17:16       Assessment & Plan:   Evan PartyMark Brazee is a 50 y.o. male  Toe swelling - Plan: DG Toe 3rd Left [place] - Plan: DG Toe 3rd Left Contusion of lesser toe of left foot without damage to nail, initial encounter  - contusion with bruising. No fracture seen. Symptomatic care., buddy taping if needed, rtc precautions  Diabetes mellitus type 2, diet-controlled (HCC) - Plan: Hemoglobin A1c Numbness of foot  - check A1c, but RTC to discuss DM and plan for longstanding foot numbness/suspected peripheral neuropathy with Wallis BambergMario Mani.   Meds ordered this encounter  Medications  . loratadine (CLARITIN) 10 MG tablet    Sig: Take 10 mg by mouth  daily.   Patient Instructions   Toe swelling and bruising appears to be due to a contusion or bruising of the toe. I do not see a fracture at this time, but if it was broken, usually buddy taping in a hard soled shoe is sufficient. Keep an eye on that area and make sure it is not open or draining. If it does open/drain - cleanse with soap and water twice per day and keep clean/covered. If any increased swelling, redness, or other worsening symptoms, return for recheck.   I will check your diabetes test again, but recommend discussing your chronic toe and foot numbness and previous workup with Wallis BambergMario Mani, PA-C at follow up.   Please schedule an appointment with him as soon as convenient.    IF you received an x-ray today, you will receive an invoice from [facility]. Please contact [facility] at [phone]  with questions or concerns regarding your invoice.   IF you received labwork today, you will receive an invoice from [facility]. Please contact [facility] at [phone] with questions or concerns regarding your invoice.   Our billing staff will not be able to assist you with questions regarding bills from these companies.  You will be contacted with the lab results as soon as they are available. The fastest way to get your results is to activate your My Chart account. Instructions are located on the last page of this paperwork. If you have not heard from [place] regarding the results in 2 weeks, please contact this office.

## 2016-11-07 NOTE — Patient Instructions (Addendum)
Toe swelling and bruising appears to be due to a contusion or bruising of the toe. I do not see a fracture at this time, but if it was broken, usually buddy taping in a hard soled shoe is sufficient. Keep an eye on that area and make sure it is not open or draining. If it does open/drain - cleanse with soap and water twice per day and keep clean/covered. If any increased swelling, redness, or other worsening symptoms, return for recheck.   I will check your diabetes test again, but recommend discussing your chronic toe and foot numbness and previous workup with Wallis BambergMario Mani, PA-C at follow up.   Please schedule an appointment with him as soon as convenient.    IF you received an x-ray today, you will receive an invoice from [facility]. Please contact [facility] at [phone] with questions or concerns regarding your invoice.   IF you received labwork today, you will receive an invoice from [facility]. Please contact [facility] at [phone] with questions or concerns regarding your invoice.   Our billing staff will not be able to assist you with questions regarding bills from these companies.  You will be contacted with the lab results as soon as they are available. The fastest way to get your results is to activate your My Chart account. Instructions are located on the last page of this paperwork. If you have not heard from us regarding the results in 2 weeks, please contact this office.

## 2016-11-08 DIAGNOSIS — G4733 Obstructive sleep apnea (adult) (pediatric): Secondary | ICD-10-CM | POA: Diagnosis not present

## 2016-11-08 LAB — HEMOGLOBIN A1C
Est. average glucose Bld gHb Est-mCnc: 180 mg/dL
HEMOGLOBIN A1C: 7.9 % — AB (ref 4.8–5.6)

## 2016-11-09 NOTE — Telephone Encounter (Signed)
Error

## 2016-11-21 ENCOUNTER — Ambulatory Visit (INDEPENDENT_AMBULATORY_CARE_PROVIDER_SITE_OTHER): Payer: BLUE CROSS/BLUE SHIELD | Admitting: Family Medicine

## 2016-11-21 ENCOUNTER — Telehealth: Payer: Self-pay | Admitting: Emergency Medicine

## 2016-11-21 VITALS — BP 118/80 | HR 108 | Temp 98.4°F | Resp 16 | Ht 72.5 in | Wt 389.0 lb

## 2016-11-21 DIAGNOSIS — R7989 Other specified abnormal findings of blood chemistry: Secondary | ICD-10-CM

## 2016-11-21 DIAGNOSIS — R509 Fever, unspecified: Secondary | ICD-10-CM | POA: Diagnosis not present

## 2016-11-21 DIAGNOSIS — R350 Frequency of micturition: Secondary | ICD-10-CM | POA: Diagnosis not present

## 2016-11-21 DIAGNOSIS — R109 Unspecified abdominal pain: Secondary | ICD-10-CM

## 2016-11-21 DIAGNOSIS — R21 Rash and other nonspecific skin eruption: Secondary | ICD-10-CM

## 2016-11-21 DIAGNOSIS — N10 Acute tubulo-interstitial nephritis: Secondary | ICD-10-CM

## 2016-11-21 LAB — POCT URINALYSIS DIP (MANUAL ENTRY)
Bilirubin, UA: NEGATIVE
GLUCOSE UA: NEGATIVE
NITRITE UA: NEGATIVE
Protein Ur, POC: >=300 — AB
Spec Grav, UA: 1.02
UROBILINOGEN UA: 1
pH, UA: 5.5

## 2016-11-21 LAB — POCT CBC
Granulocyte percent: 90.8 %G — AB (ref 37–80)
HCT, POC: 41.4 % — AB (ref 43.5–53.7)
Hemoglobin: 14.5 g/dL (ref 14.1–18.1)
LYMPH, POC: 1.3 (ref 0.6–3.4)
MCH, POC: 30.7 pg (ref 27–31.2)
MCHC: 35.2 g/dL (ref 31.8–35.4)
MCV: 87.4 fL (ref 80–97)
MID (CBC): 0.5 (ref 0–0.9)
MPV: 7.2 fL (ref 0–99.8)
PLATELET COUNT, POC: 158 10*3/uL (ref 142–424)
POC Granulocyte: 17.7 — AB (ref 2–6.9)
POC LYMPH %: 6.5 % — AB (ref 10–50)
POC MID %: 2.7 %M (ref 0–12)
RBC: 4.74 M/uL (ref 4.69–6.13)
RDW, POC: 13.3 %
WBC: 19.5 10*3/uL — AB (ref 4.6–10.2)

## 2016-11-21 LAB — POC MICROSCOPIC URINALYSIS (UMFC): Mucus: ABSENT

## 2016-11-21 MED ORDER — CEFTRIAXONE SODIUM 1 G IJ SOLR
1.0000 g | Freq: Once | INTRAMUSCULAR | Status: AC
  Administered 2016-11-21: 1 g via INTRAMUSCULAR

## 2016-11-21 MED ORDER — LEVOFLOXACIN 500 MG PO TABS: 500.0000 mg | ORAL_TABLET | Freq: Every day | ORAL | 0 refills | Status: DC

## 2016-11-21 NOTE — Progress Notes (Signed)
Subjective:  By signing my name below, I, Stann Ore, attest that this documentation has been prepared under the direction and in the presence of Meredith Staggers, MD. Electronically Signed: Stann Ore, Scribe. 11/21/2016 , 11:50 AM .  Patient was seen in Room 7 .   Patient ID: Evan Hamilton, male    DOB: Sep 10, 1966, 51 y.o.   MRN: [id number] Chief Complaint  Patient presents with  . Nephrolithiasis    x1 week; patient states he believes he has an kidney stone; denies any blood in his urine  . Back Pain    states his back hurts and his urine is cloudy   HPI Evan Hamilton is a 51 y.o. male Here for back and cloudy urine. He has history of diabetes, obesity, and peripheral neuropathy. He has been on diet control for his diabetes to this point.   Patient states waking up at 3:30AM with hot & cold spells 10 days ago. He would wake up every 45 minutes to an hour to urinate with some diarrhea. He felt feverish with sweats and fatigue. He drank 2 5-hour energy shots to get the energy to go to the pharmacy to buy OTC nyquil. He felt better for about 3 days, but noticed a rash over his bilateral forearms and some over his lower back. He returned to work a week ago, although he still had a rash. He was feeling generally well for 3 days, where he left work early due to left-sided back pain. When he went home, and fell asleep, but hot & cold spells started up again with urination frequency. He's been feeling more abdominal bloating but less flank pain with decreased appetite. He tried to force down a small salad yesterday with some toast, but informs that it was difficult. He has also been trying to keep liquids down. He's been feeling nauseous without vomiting this morning. He's had UTI in the past, but doesn't recall when it last occurred. He denies knowledge of prostate infection in the past. He denies sore throat, congestion, or cough.   Patient Active Problem List   Diagnosis Date Noted  . Acute  biliary pancreatitis 07/18/2014   Past Medical History:  Diagnosis Date  . Allergy   . Anxiety and depression   . Diabetes mellitus without complication [facility])    Past Surgical History:  Procedure Laterality Date  . CHOLECYSTECTOMY N/A 07/22/2014   Procedure: LAPAROSCOPIC CHOLECYSTECTOMY WITH INTRAOPERATIVE CHOLANGIOGRAM;  Surgeon: Harriette Bouillon, MD;  Location: [facility] OR;  Service: General;  Laterality: N/A;   Allergies  Allergen Reactions  . Penicillins Hives and Rash   Prior to Admission medications   Medication Sig Start Date End Date Taking? Authorizing Provider  loratadine (CLARITIN) 10 MG tablet Take 10 mg by mouth daily.   Yes Historical Provider, MD   Social History   Social History  . Marital status: Single    Spouse name: N/A  . Number of children: 0  . Years of education: college   Occupational History  . Not on file.   Social History Main Topics  . Smoking status: Current Every Day Smoker    Types: Cigars  . Smokeless tobacco: Never Used  . Alcohol use 1.5 oz/week    3 Standard drinks or equivalent per week  . Drug use: No  . Sexual activity: No   Other Topics Concern  . Not on file   Social History Narrative   Drinks "some" soda and a "couple" of 5 hour energy drinks a week.  Review of Systems  Constitutional: Positive for appetite change, chills, diaphoresis, fatigue and fever.  HENT: Negative for congestion and sore throat.   Respiratory: Negative for cough.   Gastrointestinal: Positive for abdominal distention, diarrhea and nausea. Negative for vomiting.  Genitourinary: Positive for frequency. Negative for hematuria.  Musculoskeletal: Positive for back pain.  Skin: Positive for rash.       Objective:   Physical Exam  Constitutional: He is oriented to person, place, and time. He appears well-developed and well-nourished. No distress.  HENT:  Head: Normocephalic and atraumatic.  Eyes: EOM are normal. Pupils are equal, round, and reactive to  light.  Neck: Neck supple.  Cardiovascular: Regular rhythm.  Tachycardia present.   Pulmonary/Chest: Effort normal. No respiratory distress.  Abdominal: There is tenderness in the right lower quadrant and suprapubic area. There is CVA tenderness (possible, mild left-sided). There is negative Murphy's sign.  Musculoskeletal: Normal range of motion.  Neurological: He is alert and oriented to person, place, and time.  Skin: Skin is warm and dry.  Faint light pink macular rash on arms, but nothing noticed over his chest or back  Psychiatric: He has a normal mood and affect. His behavior is normal.  Nursing note and vitals reviewed.   Vitals:   11/21/16 1057  BP: 100/80  Pulse: (!) 108  Resp: 18  Temp: 98.5 F (36.9 C)  TempSrc: Oral  SpO2: 95%  Weight: (!) 389 lb (176.4 kg)  Height: 6' 0.5" (1.842 m)   Results for orders placed or performed in visit on 11/21/16  POCT CBC  Result Value Ref Range   WBC 19.5 (A) 4.6 - 10.2 K/uL   Lymph, poc 1.3 0.6 - 3.4   POC LYMPH PERCENT 6.5 (A) 10 - 50 %L   MID (cbc) 0.5 0 - 0.9   POC MID % 2.7 0 - 12 %M   POC Granulocyte 17.7 (A) 2 - 6.9   Granulocyte percent 90.8 (A) 37 - 80 %G   RBC 4.74 4.69 - 6.13 M/uL   Hemoglobin 14.5 14.1 - 18.1 g/dL   HCT, POC 82.9 (A) 56.2 - 53.7 %   MCV 87.4 80 - 97 fL   MCH, POC 30.7 27 - 31.2 pg   MCHC 35.2 31.8 - 35.4 g/dL   RDW, POC 13.0 %   Platelet Count, POC 158 142 - 424 K/uL   MPV 7.2 0 - 99.8 fL  POCT urinalysis dipstick  Result Value Ref Range   Color, UA yellow yellow   Clarity, UA cloudy (A) clear   Glucose, UA negative negative   Bilirubin, UA negative negative   Ketones, POC UA small (15) (A) negative   Spec Grav, UA 1.020    Blood, UA large (A) negative   pH, UA 5.5    Protein Ur, POC >=300 (A) negative   Urobilinogen, UA 1.0    Nitrite, UA Negative Negative   Leukocytes, UA small (1+) (A) Negative  POCT Microscopic Urinalysis ([facility])  Result Value Ref Range   WBC,UR,HPF,POC Too  numerous to count  (A) None WBC/hpf   RBC,UR,HPF,POC Few (A) None RBC/hpf   Bacteria Too numerous to count  None, Too numerous to count   Mucus Absent Absent   Epithelial Cells, UR Per Microscopy Few (A) None, Too numerous to count cells/hpf       Assessment & Plan:    Evan Hamilton is a 51 y.o. male Urinary frequency - Plan: PSA, POCT urinalysis dipstick, POCT Microscopic Urinalysis ([facility]), Basic  metabolic panel  Rash and nonspecific skin eruption - Plan: POCT CBC  Fever chills - Plan: POCT CBC, Basic metabolic panel  Left flank pain - Plan: Basic metabolic panel  Acute pyelonephritis - Plan: cefTRIAXone (ROCEPHIN) injection 1 g, Urine culture  Suspected pyelonephritis versus prostatitis. Tachycardic, but afebrile. Blood pressure maintained. Tolerating by mouth fluids.   - Decided on initial outpatient treatment trial with Rocephin 1 g IV in office, Levaquin 500 milligrams daily.    - Check urine culture, PSA, BMP.   - Penicillin allergy noted, discussed possible reaction to Rocephin, but previous rash/hives. No respiratory sx's. Decided to still use rocephin with RTC/ER precautions.   - Overnight ER precautions given if any rigors or worsening symptoms. Otherwise follow-up tomorrow for repeat evaluation and possible testing.  Meds ordered this encounter  Medications  . cefTRIAXone (ROCEPHIN) injection 1 g  . levofloxacin (LEVAQUIN) 500 MG tablet    Sig: Take 1 tablet (500 mg total) by mouth daily.    Dispense:  10 tablet    Refill:  0   Patient Instructions   You do have a urinary infection that may be into kidney or prostate. I checked other blood tests for this. Heart rate is up likely due to this infection.  I gave you an injection of antibiotic today and start levofloxacin as soon as possible today. Drink fluids - water is best. If you are feeling worse this afternoon or evening including any shaking chills, inability to pass urine, or other worsening go to the emergency  room as that infection could be in your bloodstream requiring IV antibiotics and hospital treatment. Otherwise follow up with [place] tomorrow morning for recheck.   Return to the clinic or go to the nearest emergency room if any of your symptoms worsen or new symptoms occur.   Urinary Tract Infection, Adult A urinary tract infection (UTI) is an infection of any part of the urinary tract, which includes the kidneys, ureters, bladder, and urethra. These organs make, store, and get rid of urine in the body. UTI can be a bladder infection (cystitis) or kidney infection (pyelonephritis). What are the causes? This infection may be caused by fungi, viruses, or bacteria. Bacteria are the most common cause of UTIs. This condition can also be caused by repeated incomplete emptying of the bladder during urination. What increases the risk? This condition is more likely to develop if:  You ignore your need to urinate or hold urine for long periods of time.  You do not empty your bladder completely during urination.  You wipe back to front after urinating or having a bowel movement, if you are male.  You are uncircumcised, if you are male.  You are constipated.  You have a urinary catheter that stays in place (indwelling).  You have a weak defense (immune) system.  You have a medical condition that affects your bowels, kidneys, or bladder.  You have diabetes.  You take antibiotic medicines frequently or for long periods of time, and the antibiotics no longer work well against certain types of infections (antibiotic resistance).  You take medicines that irritate your urinary tract.  You are exposed to chemicals that irritate your urinary tract.  You are male. What are the signs or symptoms? Symptoms of this condition include:  Fever.  Frequent urination or passing small amounts of urine frequently.  Needing to urinate urgently.  Pain or burning with urination.  Urine that smells bad  or unusual.  Cloudy urine.  Pain in  the lower abdomen or back.  Trouble urinating.  Blood in the urine.  Vomiting or being less hungry than normal.  Diarrhea or abdominal pain.  Vaginal discharge, if you are male. How is this diagnosed? This condition is diagnosed with a medical history and physical exam. You will also need to provide a urine sample to test your urine. Other tests may be done, including:  Blood tests.  Sexually transmitted disease (STD) testing. If you have had more than one UTI, a cystoscopy or imaging studies may be done to determine the cause of the infections. How is this treated? Treatment for this condition often includes a combination of two or more of the following:  Antibiotic medicine.  Other medicines to treat less common causes of UTI.  Over-the-counter medicines to treat pain.  Drinking enough water to stay hydrated. Follow these instructions at home:  Take over-the-counter and prescription medicines only as told by your health care provider.  If you were prescribed an antibiotic, take it as told by your health care provider. Do not stop taking the antibiotic even if you start to feel better.  Avoid alcohol, caffeine, tea, and carbonated beverages. They can irritate your bladder.  Drink enough fluid to keep your urine clear or pale yellow.  Keep all follow-up visits as told by your health care provider. This is important.  Make sure to:  Empty your bladder often and completely. Do not hold urine for long periods of time.  Empty your bladder before and after sex.  Wipe from front to back after a bowel movement if you are male. Use each tissue one time when you wipe. Contact a health care provider if:  You have back pain.  You have a fever.  You feel nauseous or vomit.  Your symptoms do not get better after 3 days.  Your symptoms go away and then return. Get help right away if:  You have severe back pain or lower  abdominal pain.  You are vomiting and cannot keep down any medicines or water. This information is not intended to replace advice given to you by your health care provider. Make sure you discuss any questions you have with your health care provider. Document Released: 08/16/2005 Document Revised: 04/19/2016 Document Reviewed: 09/27/2015 Elsevier Interactive Patient Education  2017 [employer]   IF you received an x-ray today, you will receive an invoice from [facility]. Please contact [facility] at [phone] with questions or concerns regarding your invoice.   IF you received labwork today, you will receive an invoice from [facility]. Please contact [facility] at [phone] with questions or concerns regarding your invoice.   Our billing staff will not be able to assist you with questions regarding bills from these companies.  You will be contacted with the lab results as soon as they are available. The fastest way to get your results is to activate your My Chart account. Instructions are located on the last page of this paperwork. If you have not heard from us regarding the results in 2 weeks, please contact this office.        I personally performed the services described in this documentation, which was scribed in my presence. The recorded information has been reviewed and considered, and addended by me as needed.   Signed,   Meredith StaggersJeffrey Chyler Creely, MD [facility].  11/21/16 7:58 PM

## 2016-11-21 NOTE — Telephone Encounter (Signed)
-----   Message from Shade FloodJeffrey R Greene, MD sent at 11/18/2016 11:02 PM EST ----- Call patient.  A1c was elevated  - goal is less than 7. Follow up with Wallis BambergMario Mani to decide on possible med changes and to follow up on numbness in feet. Let me know if there are questions.

## 2016-11-21 NOTE — Patient Instructions (Addendum)
You do have a urinary infection that may be into kidney or prostate. I checked other blood tests for this. Heart rate is up likely due to this infection.  I gave you an injection of antibiotic today and start levofloxacin as soon as possible today. Drink fluids - water is best. If you are feeling worse this afternoon or evening including any shaking chills, inability to pass urine, or other worsening go to the emergency room as that infection could be in your bloodstream requiring IV antibiotics and hospital treatment. Otherwise follow up with us tomorrow morning for recheck.   Return to the clinic or go to the nearest emergency room if any of your symptoms worsen or new symptoms occur.   Urinary Tract Infection, Adult A urinary tract infection (UTI) is an infection of any part of the urinary tract, which includes the kidneys, ureters, bladder, and urethra. These organs make, store, and get rid of urine in the body. UTI can be a bladder infection (cystitis) or kidney infection (pyelonephritis). What are the causes? This infection may be caused by fungi, viruses, or bacteria. Bacteria are the most common cause of UTIs. This condition can also be caused by repeated incomplete emptying of the bladder during urination. What increases the risk? This condition is more likely to develop if:  You ignore your need to urinate or hold urine for long periods of time.  You do not empty your bladder completely during urination.  You wipe back to front after urinating or having a bowel movement, if you are male.  You are uncircumcised, if you are male.  You are constipated.  You have a urinary catheter that stays in place (indwelling).  You have a weak defense (immune) system.  You have a medical condition that affects your bowels, kidneys, or bladder.  You have diabetes.  You take antibiotic medicines frequently or for long periods of time, and the antibiotics no longer work well against certain  types of infections (antibiotic resistance).  You take medicines that irritate your urinary tract.  You are exposed to chemicals that irritate your urinary tract.  You are male. What are the signs or symptoms? Symptoms of this condition include:  Fever.  Frequent urination or passing small amounts of urine frequently.  Needing to urinate urgently.  Pain or burning with urination.  Urine that smells bad or unusual.  Cloudy urine.  Pain in the lower abdomen or back.  Trouble urinating.  Blood in the urine.  Vomiting or being less hungry than normal.  Diarrhea or abdominal pain.  Vaginal discharge, if you are male. How is this diagnosed? This condition is diagnosed with a medical history and physical exam. You will also need to provide a urine sample to test your urine. Other tests may be done, including:  Blood tests.  Sexually transmitted disease (STD) testing. If you have had more than one UTI, a cystoscopy or imaging studies may be done to determine the cause of the infections. How is this treated? Treatment for this condition often includes a combination of two or more of the following:  Antibiotic medicine.  Other medicines to treat less common causes of UTI.  Over-the-counter medicines to treat pain.  Drinking enough water to stay hydrated. Follow these instructions at home:  Take over-the-counter and prescription medicines only as told by your health care provider.  If you were prescribed an antibiotic, take it as told by your health care provider. Do not stop taking the antibiotic even if you  start to feel better.  Avoid alcohol, caffeine, tea, and carbonated beverages. They can irritate your bladder.  Drink enough fluid to keep your urine clear or pale yellow.  Keep all follow-up visits as told by your health care provider. This is important.  Make sure to:  Empty your bladder often and completely. Do not hold urine for long periods of  time.  Empty your bladder before and after sex.  Wipe from front to back after a bowel movement if you are male. Use each tissue one time when you wipe. Contact a health care provider if:  You have back pain.  You have a fever.  You feel nauseous or vomit.  Your symptoms do not get better after 3 days.  Your symptoms go away and then return. Get help right away if:  You have severe back pain or lower abdominal pain.  You are vomiting and cannot keep down any medicines or water. This information is not intended to replace advice given to you by your health care provider. Make sure you discuss any questions you have with your health care provider. Document Released: 08/16/2005 Document Revised: 04/19/2016 Document Reviewed: 09/27/2015 Elsevier Interactive Patient Education  2017 [employer].   IF you received an x-ray today, you will receive an invoice from [facility]. Please contact [facility] at [phone] with questions or concerns regarding your invoice.   IF you received labwork today, you will receive an invoice from [place]. Please contact [facility] at [phone] with questions or concerns regarding your invoice.   Our billing staff will not be able to assist you with questions regarding bills from these companies.  You will be contacted with the lab results as soon as they are available. The fastest way to get your results is to activate your My Chart account. Instructions are located on the last page of this paperwork. If you have not heard from [place] regarding the results in 2 weeks, please contact this office.

## 2016-11-22 ENCOUNTER — Ambulatory Visit (INDEPENDENT_AMBULATORY_CARE_PROVIDER_SITE_OTHER): Payer: BLUE CROSS/BLUE SHIELD | Admitting: Urgent Care

## 2016-11-22 ENCOUNTER — Ambulatory Visit (INDEPENDENT_AMBULATORY_CARE_PROVIDER_SITE_OTHER): Payer: BLUE CROSS/BLUE SHIELD

## 2016-11-22 VITALS — BP 100/68 | HR 80 | Temp 97.7°F | Resp 16 | Ht 72.5 in | Wt 389.0 lb

## 2016-11-22 DIAGNOSIS — D72829 Elevated white blood cell count, unspecified: Secondary | ICD-10-CM

## 2016-11-22 DIAGNOSIS — N10 Acute tubulo-interstitial nephritis: Secondary | ICD-10-CM

## 2016-11-22 DIAGNOSIS — R21 Rash and other nonspecific skin eruption: Secondary | ICD-10-CM

## 2016-11-22 DIAGNOSIS — R109 Unspecified abdominal pain: Secondary | ICD-10-CM | POA: Diagnosis not present

## 2016-11-22 DIAGNOSIS — R1084 Generalized abdominal pain: Secondary | ICD-10-CM

## 2016-11-22 LAB — BASIC METABOLIC PANEL
BUN/Creatinine Ratio: 10 (ref 9–20)
BUN: 29 mg/dL — ABNORMAL HIGH (ref 6–24)
CO2: 24 mmol/L (ref 18–29)
Calcium: 8.4 mg/dL — ABNORMAL LOW (ref 8.7–10.2)
Chloride: 91 mmol/L — ABNORMAL LOW (ref 96–106)
Creatinine, Ser: 2.77 mg/dL — ABNORMAL HIGH (ref 0.76–1.27)
GFR, EST AFRICAN AMERICAN: 30 mL/min/{1.73_m2} — AB (ref >59)
GFR, EST NON AFRICAN AMERICAN: 26 mL/min/{1.73_m2} — AB (ref >59)
Glucose: 249 mg/dL — ABNORMAL HIGH (ref 65–99)
POTASSIUM: 4.6 mmol/L (ref 3.5–5.2)
SODIUM: 133 mmol/L — AB (ref 134–144)

## 2016-11-22 LAB — POCT URINALYSIS DIP (MANUAL ENTRY)
BILIRUBIN UA: NEGATIVE
GLUCOSE UA: NEGATIVE
Nitrite, UA: NEGATIVE
Protein Ur, POC: 100 — AB
SPEC GRAV UA: 1.01
UROBILINOGEN UA: 1
pH, UA: 5

## 2016-11-22 LAB — POCT CBC
GRANULOCYTE PERCENT: 84.4 % — AB (ref 37–80)
HEMATOCRIT: 39.2 % — AB (ref 43.5–53.7)
Hemoglobin: 13.8 g/dL — AB (ref 14.1–18.1)
Lymph, poc: 1.9 (ref 0.6–3.4)
MCH: 31.1 pg (ref 27–31.2)
MCHC: 35.2 g/dL (ref 31.8–35.4)
MCV: 88.3 fL (ref 80–97)
MID (CBC): 0.8 (ref 0–0.9)
MPV: 7.1 fL (ref 0–99.8)
POC Granulocyte: 14.3 — AB (ref 2–6.9)
POC LYMPH %: 11 % (ref 10–50)
POC MID %: 4.6 % (ref 0–12)
Platelet Count, POC: 202 10*3/uL (ref 142–424)
RBC: 4.44 M/uL — AB (ref 4.69–6.13)
RDW, POC: 13.5 %
WBC: 16.9 10*3/uL — AB (ref 4.6–10.2)

## 2016-11-22 LAB — POC MICROSCOPIC URINALYSIS (UMFC): MUCUS RE: ABSENT

## 2016-11-22 LAB — PSA: Prostate Specific Ag, Serum: 2.7 ng/mL (ref 0.0–4.0)

## 2016-11-22 MED ORDER — HYDROCODONE-ACETAMINOPHEN 5-325 MG PO TABS: 1.0000 | ORAL_TABLET | Freq: Four times a day (QID) | ORAL | 0 refills | Status: DC | PRN

## 2016-11-22 NOTE — Progress Notes (Signed)
MRN: [id number] DOB: [birth year]  Subjective:   Evan Hamilton is a 51 y.o. male presenting for chief complaint of Follow-up (urinary frequency, recheck from yesterday)  Last office visit was 11/21/2016, patient was started on levaquin, give IM Rocephin for acute pyelonephritis. Urine culture is pending. Patient is presenting for follow up today. Reports ongoing flank pain, R>L today. He does feel that it is worse than it was yesterday before treatment. He has been taking APAP and ibuprofen together with Levaquin. He does reports some improvement in his belly pain. He is hydrating very well, urinating without a problem. Tolerating food without vomiting. Has had improvement in chills, subjective fever since last night.  Evan Hamilton has a current medication list which includes the following prescription(s): levofloxacin and loratadine. Also is allergic to penicillins.  Evan Hamilton  has a past medical history of Allergy; Anxiety and depression; and Diabetes mellitus without complication (HCC). Also  has a past surgical history that includes Cholecystectomy (N/A, 07/22/2014).  Objective:   Vitals: BP 94/62 (BP Location: Right Arm, Patient Position: Sitting, Cuff Size: Large)   Pulse 80   Temp 97.7 F (36.5 C) (Oral)   Resp 16   Ht 6' 0.5" (1.842 m)   Wt (!) 389 lb (176.4 kg)   SpO2 95%   BMI 52.03 kg/m   BP Readings from Last 3 Encounters:  11/22/16 94/62  11/21/16 118/80  11/07/16 132/82    Physical Exam  Constitutional: He is oriented to person, place, and time. He appears well-developed and well-nourished.  Eyes: Right eye exhibits no discharge. Left eye exhibits no discharge.  Neck: Normal range of motion. Neck supple.  Cardiovascular: Normal rate, regular rhythm and intact distal pulses.  Exam reveals no gallop and no friction rub.   No murmur heard. Pulmonary/Chest: No respiratory distress. He has no wheezes. He has no rales.  Abdominal: Soft. Bowel sounds are normal. He exhibits no  distension and no mass. There is tenderness (epigastric and right lower quadrant). There is no guarding.  Mild bilateral CVA tenderness.  Lymphadenopathy:    He has no cervical adenopathy.  Neurological: He is alert and oriented to person, place, and time.  Skin: Skin is warm and dry. Rash (patch of erythema over radial aspect of right wrist) noted.   Results for orders placed or performed in visit on 11/22/16 (from the past 24 hour(s))  POCT urinalysis dipstick     Status: Abnormal   Collection Time: 11/22/16  2:35 PM  Result Value Ref Range   Color, UA yellow yellow   Clarity, UA cloudy (A) clear   Glucose, UA negative negative   Bilirubin, UA small (A) negative   Ketones, POC UA negative negative   Spec Grav, UA 1.010    Blood, UA large (A) negative   pH, UA 5.0    Protein Ur, POC =100 (A) negative   Urobilinogen, UA 1.0    Nitrite, UA Negative Negative   Leukocytes, UA small (1+) (A) Negative  POCT CBC     Status: Abnormal   Collection Time: 11/22/16  2:37 PM  Result Value Ref Range   WBC 16.9 (A) 4.6 - 10.2 K/uL   Lymph, poc 1.9 0.6 - 3.4   POC LYMPH PERCENT 11.0 10 - 50 %L   MID (cbc) 0.8 0 - 0.9   POC MID % 4.6 0 - 12 %M   POC Granulocyte 14.3 (A) 2 - 6.9   Granulocyte percent 84.4 (A) 37 - 80 %G   RBC  4.44 (A) 4.69 - 6.13 M/uL   Hemoglobin 13.8 (A) 14.1 - 18.1 g/dL   HCT, POC 16.139.2 (A) 09.643.5 - 53.7 %   MCV 88.3 80 - 97 fL   MCH, POC 31.1 27 - 31.2 pg   MCHC 35.2 31.8 - 35.4 g/dL   RDW, POC 04.513.5 %   Platelet Count, POC 202 142 - 424 K/uL   MPV 7.1 0 - 99.8 fL  POCT Microscopic Urinalysis ([facility])     Status: Abnormal   Collection Time: 11/22/16  2:42 PM  Result Value Ref Range   WBC,UR,HPF,POC Many (A) None WBC/hpf   RBC,UR,HPF,POC Many (A) None RBC/hpf   Bacteria None None, Too numerous to count   Mucus Absent Absent   Epithelial Cells, UR Per Microscopy Few (A) None, Too numerous to count cells/hpf   Dg Abd 1 View  Result Date: 11/22/2016 CLINICAL DATA:   Abdominal pain. EXAM: ABDOMEN - 1 VIEW COMPARISON:  Ultrasound 07/10/2014.  CT 07/04/2011 report. FINDINGS: Exam limited by technique. Soft tissue structures are unremarkable. No bowel distention. Degenerative changes lumbar spine and both hips. IMPRESSION: No acute abnormality. Electronically Signed   By: Maisie Fushomas  Register   On: 11/22/2016 15:35   Assessment and Plan :   This case was precepted with Dr. Gwendolyn GrantWalden.   1. Acute pyelonephritis 2. Generalized abdominal pain 3. Leukocytosis, unspecified type - Patient given 1L of fluids today with slight improvement in his BP. Down-trending cbc, tolerating food and fluids, antibiotic.  X-ray reassuring. Hydrocodone-APAP every 6 hours as needed for moderate-severe pain, use laxative or stool softener for any associated constipation. Schedule APAP otherwise. Will recommend f/u with Dr. Neva SeatGreene on 11/26/2015.  4. Rash and nonspecific skin eruption - Recommended patient use benadryl 50mg  for potential reaction to Rocephin given history of PCN allergies. Patient will take this every 6 hours until follow up.   Wallis BambergMario Nalini Alcaraz, PA-C [facility] [phone] 11/22/2016 2:24 PM

## 2016-11-22 NOTE — Patient Instructions (Addendum)
Pyelonephritis, Adult Introduction Pyelonephritis is a kidney infection. The kidneys are the organs that filter a person's blood and move waste out of the bloodstream and into the urine. Urine passes from the kidneys, through the ureters, and into the bladder. There are two main types of pyelonephritis:  Infections that come on quickly without any warning (acute pyelonephritis).  Infections that last for a long period of time (chronic pyelonephritis). In most cases, the infection clears up with treatment and does not cause further problems. More severe infections or chronic infections can sometimes spread to the bloodstream or lead to other problems with the kidneys. What are the causes? This condition is usually caused by:  Bacteria traveling from the bladder to the kidney through infected urine. The urine in the bladder can become infected with bacteria from:  Bladder infection (cystitis).  Inflammation of the prostate gland (prostatitis).  Sexual intercourse, in females.  Bacteria traveling from the bloodstream to the kidney. What increases the risk? This condition is more likely to develop in:  Pregnant women.  Older people.  People who have diabetes.  People who have kidney stones or bladder stones.  People who have other abnormalities of the kidney or ureter.  People who have a catheter placed in the bladder.  People who have cancer.  People who are sexually active.  Women who use spermicides.  People who have had a prior urinary tract infection. What are the signs or symptoms? Symptoms of this condition include:  Frequent urination.  Strong or persistent urge to urinate.  Burning or stinging when urinating.  Abdominal pain.  Back pain.  Pain in the side or flank area.  Fever.  Chills.  Blood in the urine, or dark urine.  Nausea.  Vomiting. How is this diagnosed? This condition may be diagnosed based on:  Medical history and physical  exam.  Urine tests.  Blood tests. You may also have imaging tests of the kidneys, such as an ultrasound or CT scan. How is this treated? Treatment for this condition may depend on the severity of the infection.  If the infection is mild and is found early, you may be treated with antibiotic medicines taken by mouth. You will need to drink fluids to remain hydrated.  If the infection is more severe, you may need to stay in the hospital and receive antibiotics given directly into a vein through an IV tube. You may also need to receive fluids through an IV tube if you are not able to remain hydrated. After your hospital stay, you may need to take oral antibiotics for a period of time. Other treatments may be required, depending on the cause of the infection. Follow these instructions at home: Medicines  Take over-the-counter and prescription medicines only as told by your health care provider.  If you were prescribed an antibiotic medicine, take it as told by your health care provider. Do not stop taking the antibiotic even if you start to feel better. General instructions  Drink enough fluid to keep your urine clear or pale yellow.  Avoid caffeine, tea, and carbonated beverages. They tend to irritate the bladder.  Urinate often. Avoid holding in urine for long periods of time.  Urinate before and after sex.  After a bowel movement, women should cleanse from front to back. Use each tissue only once.  Keep all follow-up visits as told by your health care provider. This is important. Contact a health care provider if:  Your symptoms do not get better after  2 days of treatment.  Your symptoms get worse.  You have a fever. Get help right away if:  You are unable to take your antibiotics or fluids.  You have shaking chills.  You vomit.  You have severe flank or back pain.  You have extreme weakness or fainting. This information is not intended to replace advice given to you  by your health care provider. Make sure you discuss any questions you have with your health care provider. Document Released: 11/06/2005 Document Revised: 04/13/2016 Document Reviewed: 03/01/2015  2017 Elsevier     IF you received an x-ray today, you will receive an invoice from [facility]. Please contact [facility] at [phone] with questions or concerns regarding your invoice.   IF you received labwork today, you will receive an invoice from [facility]. Please contact [facility] at [phone] with questions or concerns regarding your invoice.   Our billing staff will not be able to assist you with questions regarding bills from these companies.  You will be contacted with the lab results as soon as they are available. The fastest way to get your results is to activate your My Chart account. Instructions are located on the last page of this paperwork. If you have not heard from us regarding the results in 2 weeks, please contact this office.

## 2016-11-23 ENCOUNTER — Other Ambulatory Visit: Payer: BLUE CROSS/BLUE SHIELD

## 2016-11-23 DIAGNOSIS — N10 Acute tubulo-interstitial nephritis: Secondary | ICD-10-CM | POA: Diagnosis not present

## 2016-11-23 DIAGNOSIS — R7989 Other specified abnormal findings of blood chemistry: Secondary | ICD-10-CM | POA: Diagnosis not present

## 2016-11-23 DIAGNOSIS — R945 Abnormal results of liver function studies: Principal | ICD-10-CM

## 2016-11-23 LAB — URINE CULTURE

## 2016-11-23 NOTE — Addendum Note (Signed)
Addended by: Meredith StaggersGREENE, Shatima Zalar R on: 11/23/2016 09:13 AM   Modules accepted: Orders

## 2016-11-24 LAB — BASIC METABOLIC PANEL
BUN / CREAT RATIO: 14 (ref 9–20)
BUN: 35 mg/dL — ABNORMAL HIGH (ref 6–24)
CO2: 22 mmol/L (ref 18–29)
CREATININE: 2.46 mg/dL — AB (ref 0.76–1.27)
Calcium: 8.2 mg/dL — ABNORMAL LOW (ref 8.7–10.2)
Chloride: 98 mmol/L (ref 96–106)
GFR, EST AFRICAN AMERICAN: 34 mL/min/{1.73_m2} — AB (ref >59)
GFR, EST NON AFRICAN AMERICAN: 30 mL/min/{1.73_m2} — AB (ref >59)
Glucose: 190 mg/dL — ABNORMAL HIGH (ref 65–99)
POTASSIUM: 4.2 mmol/L (ref 3.5–5.2)
SODIUM: 139 mmol/L (ref 134–144)

## 2016-11-24 LAB — HEPATITIS B CORE ANTIBODY, IGM: Hep B C IgM: NEGATIVE

## 2016-11-24 LAB — FE+TIBC+FER
FERRITIN: 1251 ng/mL — AB (ref 30–400)
Iron Saturation: 19 % (ref 15–55)
Iron: 31 ug/dL — ABNORMAL LOW (ref 38–169)
TIBC: 163 ug/dL — AB (ref 250–450)
UIBC: 132 ug/dL (ref 111–343)

## 2016-11-25 ENCOUNTER — Encounter (HOSPITAL_COMMUNITY): Payer: Self-pay | Admitting: *Deleted

## 2016-11-25 ENCOUNTER — Emergency Department (HOSPITAL_COMMUNITY): Payer: BLUE CROSS/BLUE SHIELD

## 2016-11-25 ENCOUNTER — Inpatient Hospital Stay (HOSPITAL_COMMUNITY)
Admission: EM | Admit: 2016-11-25 | Discharge: 2016-11-28 | DRG: 683 | Disposition: A | Discharge status: Home / Self Care | Payer: BLUE CROSS/BLUE SHIELD | Attending: Family Medicine | Admitting: Family Medicine

## 2016-11-25 ENCOUNTER — Ambulatory Visit (INDEPENDENT_AMBULATORY_CARE_PROVIDER_SITE_OTHER): Payer: BLUE CROSS/BLUE SHIELD | Admitting: Family Medicine

## 2016-11-25 VITALS — BP 110/66 | HR 98 | Temp 98.5°F | Resp 20 | Ht 74.0 in | Wt 379.0 lb

## 2016-11-25 DIAGNOSIS — R1031 Right lower quadrant pain: Secondary | ICD-10-CM | POA: Diagnosis not present

## 2016-11-25 DIAGNOSIS — Z880 Allergy status to penicillin: Secondary | ICD-10-CM | POA: Diagnosis not present

## 2016-11-25 DIAGNOSIS — Z9049 Acquired absence of other specified parts of digestive tract: Secondary | ICD-10-CM

## 2016-11-25 DIAGNOSIS — J208 Acute bronchitis due to other specified organisms: Secondary | ICD-10-CM | POA: Diagnosis not present

## 2016-11-25 DIAGNOSIS — K143 Hypertrophy of tongue papillae: Secondary | ICD-10-CM | POA: Diagnosis not present

## 2016-11-25 DIAGNOSIS — R21 Rash and other nonspecific skin eruption: Secondary | ICD-10-CM | POA: Diagnosis not present

## 2016-11-25 DIAGNOSIS — Z818 Family history of other mental and behavioral disorders: Secondary | ICD-10-CM | POA: Diagnosis not present

## 2016-11-25 DIAGNOSIS — Z6841 Body Mass Index (BMI) 40.0 and over, adult: Secondary | ICD-10-CM | POA: Diagnosis not present

## 2016-11-25 DIAGNOSIS — N179 Acute kidney failure, unspecified: Secondary | ICD-10-CM | POA: Diagnosis not present

## 2016-11-25 DIAGNOSIS — F329 Major depressive disorder, single episode, unspecified: Secondary | ICD-10-CM | POA: Diagnosis present

## 2016-11-25 DIAGNOSIS — Z833 Family history of diabetes mellitus: Secondary | ICD-10-CM

## 2016-11-25 DIAGNOSIS — N200 Calculus of kidney: Secondary | ICD-10-CM | POA: Diagnosis present

## 2016-11-25 DIAGNOSIS — E669 Obesity, unspecified: Secondary | ICD-10-CM

## 2016-11-25 DIAGNOSIS — E119 Type 2 diabetes mellitus without complications: Secondary | ICD-10-CM | POA: Diagnosis present

## 2016-11-25 DIAGNOSIS — F17290 Nicotine dependence, other tobacco product, uncomplicated: Secondary | ICD-10-CM | POA: Diagnosis present

## 2016-11-25 DIAGNOSIS — N12 Tubulo-interstitial nephritis, not specified as acute or chronic: Secondary | ICD-10-CM | POA: Diagnosis present

## 2016-11-25 DIAGNOSIS — D72829 Elevated white blood cell count, unspecified: Secondary | ICD-10-CM

## 2016-11-25 DIAGNOSIS — R7989 Other specified abnormal findings of blood chemistry: Secondary | ICD-10-CM

## 2016-11-25 DIAGNOSIS — R5383 Other fatigue: Secondary | ICD-10-CM

## 2016-11-25 DIAGNOSIS — F419 Anxiety disorder, unspecified: Secondary | ICD-10-CM | POA: Diagnosis present

## 2016-11-25 DIAGNOSIS — R739 Hyperglycemia, unspecified: Secondary | ICD-10-CM | POA: Diagnosis not present

## 2016-11-25 DIAGNOSIS — G4733 Obstructive sleep apnea (adult) (pediatric): Secondary | ICD-10-CM | POA: Diagnosis present

## 2016-11-25 DIAGNOSIS — N10 Acute tubulo-interstitial nephritis: Secondary | ICD-10-CM

## 2016-11-25 DIAGNOSIS — Z8249 Family history of ischemic heart disease and other diseases of the circulatory system: Secondary | ICD-10-CM

## 2016-11-25 DIAGNOSIS — Z79899 Other long term (current) drug therapy: Secondary | ICD-10-CM

## 2016-11-25 DIAGNOSIS — M549 Dorsalgia, unspecified: Secondary | ICD-10-CM | POA: Diagnosis present

## 2016-11-25 DIAGNOSIS — B9620 Unspecified Escherichia coli [E. coli] as the cause of diseases classified elsewhere: Secondary | ICD-10-CM | POA: Diagnosis present

## 2016-11-25 LAB — POCT CBC
GRANULOCYTE PERCENT: 83.2 % — AB (ref 37–80)
HCT, POC: 38.7 % — AB (ref 43.5–53.7)
HEMOGLOBIN: 13.7 g/dL — AB (ref 14.1–18.1)
Lymph, poc: 1.3 (ref 0.6–3.4)
MCH: 31 pg (ref 27–31.2)
MCHC: 35.3 g/dL (ref 31.8–35.4)
MCV: 87.7 fL (ref 80–97)
MID (cbc): 0.5 (ref 0–0.9)
MPV: 6.6 fL (ref 0–99.8)
PLATELET COUNT, POC: 236 10*3/uL (ref 142–424)
POC Granulocyte: 8.7 — AB (ref 2–6.9)
POC LYMPH PERCENT: 12.4 %L (ref 10–50)
POC MID %: 4.4 %M (ref 0–12)
RBC: 4.42 M/uL — AB (ref 4.69–6.13)
RDW, POC: 13.4 %
WBC: 10.5 10*3/uL — AB (ref 4.6–10.2)

## 2016-11-25 LAB — CBC
HEMATOCRIT: 37.8 % — AB (ref 39.0–52.0)
HEMOGLOBIN: 13.2 g/dL (ref 13.0–17.0)
MCH: 30.8 pg (ref 26.0–34.0)
MCHC: 34.9 g/dL (ref 30.0–36.0)
MCV: 88.3 fL (ref 78.0–100.0)
PLATELETS: 214 10*3/uL (ref 150–400)
RBC: 4.28 MIL/uL (ref 4.22–5.81)
RDW: 13.6 % (ref 11.5–15.5)
WBC: 9.7 10*3/uL (ref 4.0–10.5)

## 2016-11-25 LAB — COMPREHENSIVE METABOLIC PANEL
ALT: 46 U/L (ref 17–63)
AST: 43 U/L — ABNORMAL HIGH (ref 15–41)
Albumin: 2.5 g/dL — ABNORMAL LOW (ref 3.5–5.0)
Alkaline Phosphatase: 59 U/L (ref 38–126)
Anion gap: 11 (ref 5–15)
BUN: 22 mg/dL — ABNORMAL HIGH (ref 6–20)
CHLORIDE: 99 mmol/L — AB (ref 101–111)
CO2: 23 mmol/L (ref 22–32)
Calcium: 8.5 mg/dL — ABNORMAL LOW (ref 8.9–10.3)
Creatinine, Ser: 2 mg/dL — ABNORMAL HIGH (ref 0.61–1.24)
GFR, EST AFRICAN AMERICAN: 43 mL/min — AB (ref >60)
GFR, EST NON AFRICAN AMERICAN: 37 mL/min — AB (ref >60)
Glucose, Bld: 193 mg/dL — ABNORMAL HIGH (ref 65–99)
POTASSIUM: 3.8 mmol/L (ref 3.5–5.1)
SODIUM: 133 mmol/L — AB (ref 135–145)
Total Bilirubin: 0.6 mg/dL (ref 0.3–1.2)
Total Protein: 7.6 g/dL (ref 6.5–8.1)

## 2016-11-25 LAB — URINALYSIS, ROUTINE W REFLEX MICROSCOPIC
BILIRUBIN URINE: NEGATIVE
Glucose, UA: NEGATIVE mg/dL
KETONES UR: NEGATIVE mg/dL
Nitrite: NEGATIVE
Protein, ur: 30 mg/dL — AB
SPECIFIC GRAVITY, URINE: 1.008 (ref 1.005–1.030)
pH: 5 (ref 5.0–8.0)

## 2016-11-25 LAB — POCT URINALYSIS DIP (MANUAL ENTRY)
Bilirubin, UA: NEGATIVE
GLUCOSE UA: NEGATIVE
Ketones, POC UA: NEGATIVE
NITRITE UA: NEGATIVE
PH UA: 5.5
Protein Ur, POC: 30 — AB
SPEC GRAV UA: 1.01
Urobilinogen, UA: 0.2

## 2016-11-25 LAB — POC MICROSCOPIC URINALYSIS (UMFC): Mucus: ABSENT

## 2016-11-25 LAB — GLUCOSE, POCT (MANUAL RESULT ENTRY): POC GLUCOSE: 211 mg/dL — AB (ref 70–99)

## 2016-11-25 LAB — POCT SKIN KOH: Skin KOH, POC: NEGATIVE

## 2016-11-25 LAB — LIPASE, BLOOD: LIPASE: 21 U/L (ref 11–51)

## 2016-11-25 MED ORDER — SODIUM CHLORIDE 0.9 % IV BOLUS (SEPSIS)
1000.0000 mL | Freq: Once | INTRAVENOUS | Status: AC
  Administered 2016-11-25: 1000 mL via INTRAVENOUS

## 2016-11-25 MED ORDER — DEXTROSE 5 % IV SOLN
1.0000 g | Freq: Once | INTRAVENOUS | Status: AC
  Administered 2016-11-25: 1 g via INTRAVENOUS
  Filled 2016-11-25: qty 10

## 2016-11-25 MED ORDER — IOPAMIDOL (ISOVUE-300) INJECTION 61%
INTRAVENOUS | Status: AC
  Filled 2016-11-25: qty 30

## 2016-11-25 NOTE — ED Triage Notes (Signed)
Pt was sent here by [facility] after being tx unsuccessfully for pyelonephritis.

## 2016-11-25 NOTE — Progress Notes (Signed)
[facility]. Olene CravenJackson, Deneka Greenwalt Makika, RN

## 2016-11-25 NOTE — ED Provider Notes (Signed)
[facility]-EMERGENCY DEPT Provider Note   CSN: [id number] Arrival date & time: 11/25/16  1603  History   Chief Complaint Chief Complaint  Patient presents with  . Fatigue  . Back Pain    HPI Evan Hamilton is a 51 y.o. male.  HPI  51 y.o. male with a hx of DM, presents to the Emergency Department today from [facility] due to failed outpatient treatment of Pyelonephritis. Seen on 11/22/16 due to let flank pain that was worsening. Leukocytosis noted in office. Diagnosed with Pyelonephritis. Rocephin given during that time and started on Levaquin 500mg  QD. Noted elevated Cr as well. Presented to PCP again today due to rash with antibiotics given. Pt was taking benadryl as needed previously, but worsened despite antihistamines. Also noted in office was RLQ pain only with palpation. PCP suspects pyelo due to pyuria. Currently pt denies flank pain. Notes mild epigastric discomfort. No N/V/D. Occasional chills. No CP/SOB. No fevers at home. Able to tolerate PO well. No other symptoms noted.    Past Medical History:  Diagnosis Date  . Allergy   . Anxiety and depression   . Diabetes mellitus without complication [facility])     Patient Active Problem List   Diagnosis Date Noted  . Acute biliary pancreatitis 07/18/2014    Past Surgical History:  Procedure Laterality Date  . CHOLECYSTECTOMY N/A 07/22/2014   Procedure: LAPAROSCOPIC CHOLECYSTECTOMY WITH INTRAOPERATIVE CHOLANGIOGRAM;  Surgeon: Harriette Bouillon, MD;  Location: [facility] OR;  Service: General;  Laterality: N/A;    OB History    No data available       Home Medications    Prior to Admission medications   Medication Sig Start Date End Date Taking? Authorizing Provider  HYDROcodone-acetaminophen (NORCO) 5-325 MG tablet Take 1 tablet by mouth every 6 (six) hours as needed. 11/22/16   Wallis Bamberg, PA-C  levofloxacin (LEVAQUIN) 500 MG tablet Take 1 tablet (500 mg total) by mouth daily. 11/21/16   Shade Flood, MD  loratadine (CLARITIN) 10 MG tablet  Take 10 mg by mouth daily.    Historical Provider, MD    Family History Family History  Problem Relation Age of Onset  . Cancer Mother     Lung  . Diabetes Mother   . Heart disease Father   . Hyperlipidemia Father   . Hypertension Father   . Mental illness Sister   . Cancer Maternal Grandmother     Lung  . Diabetes Maternal Grandmother     Social History Social History  Substance Use Topics  . Smoking status: Current Every Day Smoker    Types: Cigars  . Smokeless tobacco: Never Used  . Alcohol use 1.5 oz/week    3 Standard drinks or equivalent per week     Allergies   Penicillins   Review of Systems Review of Systems ROS reviewed and all are negative for acute change except as noted in the HPI.  Physical Exam Updated Vital Signs BP 100/69 (BP Location: Left Arm)   Pulse 78   Temp 99.2 F (37.3 C) (Oral)   Resp 20   SpO2 96%   Physical Exam  Constitutional: He is oriented to person, place, and time. Vital signs are normal. He appears well-developed and well-nourished.  Morbidly obese. NAD  HENT:  Head: Normocephalic and atraumatic.  Right Ear: Hearing normal.  Left Ear: Hearing normal.  Eyes: Conjunctivae and EOM are normal. Pupils are equal, round, and reactive to light.  Neck: Normal range of motion. Neck supple.  Cardiovascular: Regular  rhythm, normal heart sounds and intact distal pulses.  Tachycardia present.   Pulmonary/Chest: Effort normal and breath sounds normal.  Abdominal: Soft. Normal appearance and bowel sounds are normal. There is tenderness in the right lower quadrant. There is no rigidity, no rebound, no guarding, no CVA tenderness, no tenderness at McBurney's point and negative Murphy's sign.  Musculoskeletal: Normal range of motion.  Neurological: He is alert and oriented to person, place, and time.  Skin: Skin is warm and dry.  Psychiatric: He has a normal mood and affect. His speech is normal and behavior is normal. Thought content  normal.  Nursing note and vitals reviewed.  ED Treatments / Results  Labs (all labs ordered are listed, but only abnormal results are displayed) Labs Reviewed  COMPREHENSIVE METABOLIC PANEL - Abnormal; Notable for the following:       Result Value   Sodium 133 (*)    Chloride 99 (*)    Glucose, Bld 193 (*)    BUN 22 (*)    Creatinine, Ser 2.00 (*)    Calcium 8.5 (*)    Albumin 2.5 (*)    AST 43 (*)    GFR calc non Af Amer 37 (*)    GFR calc Af Amer 43 (*)    All other components within normal limits  CBC - Abnormal; Notable for the following:    HCT 37.8 (*)    All other components within normal limits  URINALYSIS, ROUTINE W REFLEX MICROSCOPIC - Abnormal; Notable for the following:    APPearance HAZY (*)    Hgb urine dipstick MODERATE (*)    Protein, ur 30 (*)    Leukocytes, UA LARGE (*)    Bacteria, UA RARE (*)    Squamous Epithelial / LPF 0-5 (*)    All other components within normal limits  LIPASE, BLOOD    EKG  EKG Interpretation None      Radiology Ct Abdomen Pelvis Wo Contrast  Result Date: 11/25/2016 CLINICAL DATA:  51 year old with acute onset of right lower quadrant abdominal pain. Intravenous contrast not administered due to an elevated creatinine of 2.00. EXAM: CT ABDOMEN AND PELVIS WITHOUT CONTRAST TECHNIQUE: Multidetector CT imaging of the abdomen and pelvis was performed following the standard protocol without IV contrast. COMPARISON:  07/04/2011. FINDINGS: Lower chest: Heart size normal. Visualized lung bases clear. Chronic elevation of the left hemidiaphragm, unchanged. Hepatobiliary: Hepatic steatosis with focal areas of sparing. Surgically absent gallbladder. No biliary ductal dilation. Pancreas: Normal in appearance without evidence of mass, ductal dilation, or inflammation. Spleen: Normal in size and appearance. Adrenals/Urinary Tract: Normal appearing adrenal glands. Exophytic cyst arising from the lower pole of the right kidney measuring approximately 3  cm, increased in size since the prior CT. Approximate 3.5 cm simple cyst arising from the lower pole of the left kidney, increased in size since the prior CT. Within the limits of the unenhanced technique, no solid renal masses. No hydronephrosis. Nonobstructing very small (1-2 mm) calculus in a lower pole calyx of the left kidney. Urinary bladder mildly distended and unremarkable. Stomach/Bowel: Stomach normal in appearance for the degree of distention. Normal-appearing small bowel. Normal-appearing colon with expected stool burden. Entire colon relatively decompressed. Normal-appearing decompressed appendix in the right upper pelvis. Vascular/Lymphatic: Minimal atherosclerosis involving the abdominal aorta. No pathologic lymphadenopathy. Reproductive: Prostate gland and seminal vesicles normal in appearance. Other: Umbilical hernia containing fat. Musculoskeletal: Remote compression fracture of T12. DISH involving the lower lumbar spine. No acute abnormalities. IMPRESSION: 1. No acute abnormalities  involving the abdomen or pelvis. 2. Nonobstructing 1-2 mm calculus in a lower pole calyx of the left kidney. 3. Simple cysts involving the lower poles of both kidneys. 4. Diffuse hepatic steatosis with scattered areas of focal sparing. 5. Umbilical hernia containing fat. Electronically Signed   By: Hulan Saas M.D.   On: 11/25/2016 22:37    Procedures Procedures (including critical care time)  Medications Ordered in ED Medications  iopamidol (ISOVUE-300) 61 % injection (not administered)  sodium chloride 0.9 % bolus 1,000 mL (0 mLs Intravenous Stopped 11/25/16 2032)  cefTRIAXone (ROCEPHIN) 1 g in dextrose 5 % 50 mL IVPB (1 g Intravenous New Bag/Given 11/25/16 2029)     Initial Impression / Assessment and Plan / ED Course  I have reviewed the triage vital signs and the nursing notes.  Pertinent labs & imaging results that were available during my care of the patient were reviewed by me and considered in  my medical decision making (see chart for details).  Clinical Course    Final Clinical Impressions(s) / ED Diagnoses  {I have reviewed and evaluated the relevant laboratory values. {I have reviewed and evaluated the relevant imaging studies.  {I have reviewed the relevant previous healthcare records.  {I obtained HPI from historian. {Patient discussed with supervising physician.  ED Course:  Assessment: Pt is a 50yM who presents with RLQ with possible Pyelo from PCP office. On exam, pt in NAD. Nontoxic/nonseptic appearing. VS with tachycardia. Afebrile. Lungs CTA. Heart RRR. Abdomen TTP RLQ. No CVA tenderness. CBC with no luekocytosis. CMP with elevate Cr from baseline consistent with AKI. UA with possible infection. Suspect Pyelo vs Nephrolithiasis vs Appy. CT ABD/Pelvis w/ contrast due to Cr showed no acute abnormalities. Non obstructing renal stone noted. Given fluids in ED as well as Rocephin. Plan is to Admit to medicine due to AKI from presumed pyelonephritis.   Disposition/Plan:  Admit Pt acknowledges and agrees with plan  Supervising Physician Donnetta Hutching, MD  Final diagnoses:  AKI (acute kidney injury) [facility])  Pyelonephritis    New Prescriptions New Prescriptions   No medications on file     Audry Pili, PA-C 11/25/16 2309    Donnetta Hutching, MD 11/26/16 1610

## 2016-11-25 NOTE — H&P (Signed)
[facility]                                                                                    Patient Demographics  Evan Hamilton, is a 51 y.o. male  CSN: [id number]  MRN: [id number]  DOB - 15-Aug-1966  Admit Date - 11/25/2016  Outpatient Primary MD for the patient is Mani,Mario, PA-C   With History of -  Past Medical History:  Diagnosis Date  . Allergy   . Anxiety and depression   . Diabetes mellitus without complication [facility])       Past Surgical History:  Procedure Laterality Date  . CHOLECYSTECTOMY N/A 07/22/2014   Procedure: LAPAROSCOPIC CHOLECYSTECTOMY WITH INTRAOPERATIVE CHOLANGIOGRAM;  Surgeon: Harriette Bouillon, MD;  Location: [facility] OR;  Service: General;  Laterality: N/A;    in for   Chief Complaint  Patient presents with  . Fatigue  . Back Pain     HPI  Evan Hamilton  is a 51 y.o. male, With past medical history significant for diabetes, allergy, anxiety and depression who was diagnosed with pyelonephritis and treated at [facility] urgent care with Rocephin IM and by mouth Levaquin. Patient reports a rash however the patient's reports that it has been chronic for the last 1 year.. There was no significant improvement and on 1/4 his creatinine was noted to be elevated and was sent to the emergency room for evaluation since his symptoms did not improve. The patient denies fever or chills but complains of abdominal pains all over his abdomen with  at the lower portions. No nausea or vomiting. No urinary symptoms.    Review of Systems    In addition to the HPI above,  No Fever-chills, No Headache, No changes with Vision or hearing, No problems swallowing food or Liquids, No Chest pain, Cough or Shortness of Breath, No Nausea or Vommitting, Bowel movements are regular, No new skin rashes or bruises, No new joints pains-aches,  No new weakness, tingling, numbness in any extremity, No recent weight gain or loss, No polyuria, polydypsia or polyphagia, No  significant Mental Stressors.  A full 10 point Review of Systems was done, except as stated above, all other Review of Systems were negative.   Social History Social History  Substance Use Topics  . Smoking status: Current Every Day Smoker    Types: Cigars  . Smokeless tobacco: Never Used  . Alcohol use 1.5 oz/week    3 Standard drinks or equivalent per week     Family History Family History  Problem Relation Age of Onset  . Cancer Mother     Lung  . Diabetes Mother   . Heart disease Father   . Hyperlipidemia Father   . Hypertension Father   . Mental illness Sister   . Cancer Maternal Grandmother     Lung  . Diabetes Maternal Grandmother      Prior to Admission medications   Medication Sig Start Date End Date Taking? Authorizing Provider  diphenhydrAMINE (BENADRYL) 25 MG tablet Take 25 mg by mouth every 6 (six) hours as needed for itching or allergies.   Yes Historical Provider, MD  Ginkgo Biloba Extract (GNP GINGKO BILOBA EXTRACT)  60 MG CAPS Take 60 mg by mouth daily.   Yes Historical Provider, MD  levofloxacin (LEVAQUIN) 500 MG tablet Take 1 tablet (500 mg total) by mouth daily. 11/21/16  Yes Shade Flood, MD  loratadine (CLARITIN) 10 MG tablet Take 10 mg by mouth 2 (two) times daily.   Yes Historical Provider, MD  Multiple Vitamin (MULTIVITAMIN WITH MINERALS) TABS tablet Take 1 tablet by mouth daily.   Yes Historical Provider, MD  Potassium Gluconate 595 MG CAPS Take 595 mg by mouth daily.   Yes Historical Provider, MD  Probiotic Product (PROBIOTIC DAILY) CAPS Take 1 capsule by mouth daily.   Yes Historical Provider, MD    Allergies  Allergen Reactions  . Penicillins Hives and Rash    Physical Exam  Vitals  Blood pressure 125/80, pulse 92, temperature 99.2 F (37.3 C), temperature source Oral, resp. rate 24, SpO2 94 %.   1. General A very pleasant obese male, looks tired  2. Normal affect and insight, Not Suicidal or Homicidal, Awake Alert, Oriented X  3.  3. No F.N deficits, grossly  4. Ears and Eyes appear Normal, Conjunctivae clear, PERRLA. Moist Oral Mucosa.  5. Supple Neck, No JVD, No cervical lymphadenopathy appriciated, No Carotid Bruits.  6. Symmetrical Chest wall movement, Good air movement bilaterally, CTAB.  7. RRR, No Gallops, Rubs or Murmurs, No Parasternal Heave.  8. Positive Bowel Sounds, Abdomen Soft, obese , no significant tenderness.  9.  No Cyanosis, Normal Skin Turgor, No Skin Rash or Bruise.  10. Good muscle tone,  joints appear normal , no effusions, Normal ROM.   Data Review  CBC  Recent Labs Lab 11/21/16 1213 11/22/16 1437 11/25/16 1323 11/25/16 1655  WBC 19.5* 16.9* 10.5* 9.7  HGB 14.5 13.8* 13.7* 13.2  HCT 41.4* 39.2* 38.7* 37.8*  PLT  --   --   --  214  MCV 87.4 88.3 87.7 88.3  MCH 30.7 31.1 31.0 30.8  MCHC 35.2 35.2 35.3 34.9  RDW  --   --   --  13.6   ------------------------------------------------------------------------------------------------------------------  Chemistries   Recent Labs Lab 11/21/16 1146 11/23/16 1638 11/25/16 1655  NA 133* 139 133*  K 4.6 4.2 3.8  CL 91* 98 99*  CO2 24 22 23   GLUCOSE 249* 190* 193*  BUN 29* 35* 22*  CREATININE 2.77* 2.46* 2.00*  CALCIUM 8.4* 8.2* 8.5*  AST  --   --  43*  ALT  --   --  46  ALKPHOS  --   --  59  BILITOT  --   --  0.6   ------------------------------------------------------------------------------------------------------------------ estimated creatinine clearance is 62 mL/min for male patients and 74.6 mL/min for male patients (by C-G formula based on SCr of 2 mg/dL (H)). ------------------------------------------------------------------------------------------------------------------ No results for input(s): TSH, T4TOTAL, T3FREE, THYROIDAB in the last 72 hours.  Invalid input(s): FREET3   Coagulation profile No results for input(s): INR, PROTIME in the last 168  hours. ------------------------------------------------------------------------------------------------------------------- No results for input(s): DDIMER in the last 72 hours. -------------------------------------------------------------------------------------------------------------------  Cardiac Enzymes No results for input(s): CKMB, TROPONINI, MYOGLOBIN in the last 168 hours.  Invalid input(s): CK ------------------------------------------------------------------------------------------------------------------ Invalid input(s): POCBNP   ---------------------------------------------------------------------------------------------------------------  Urinalysis    Component Value Date/Time   COLORURINE YELLOW 11/25/2016 1725   APPEARANCEUR HAZY (A) 11/25/2016 1725   LABSPEC 1.008 11/25/2016 1725   PHURINE 5.0 11/25/2016 1725   GLUCOSEU NEGATIVE 11/25/2016 1725   HGBUR MODERATE (A) 11/25/2016 1725   BILIRUBINUR NEGATIVE 11/25/2016 1725   BILIRUBINUR negative  11/25/2016 1323   BILIRUBINUR negative 05/26/2016 1103   KETONESUR NEGATIVE 11/25/2016 1725   PROTEINUR 30 (A) 11/25/2016 1725   UROBILINOGEN 0.2 11/25/2016 1323   NITRITE NEGATIVE 11/25/2016 1725   LEUKOCYTESUR LARGE (A) 11/25/2016 1725    ----------------------------------------------------------------------------------------------------------------   Imaging results:   Ct Abdomen Pelvis Wo Contrast  Result Date: 11/25/2016 CLINICAL DATA:  51 year old with acute onset of right lower quadrant abdominal pain. Intravenous contrast not administered due to an elevated creatinine of 2.00. EXAM: CT ABDOMEN AND PELVIS WITHOUT CONTRAST TECHNIQUE: Multidetector CT imaging of the abdomen and pelvis was performed following the standard protocol without IV contrast. COMPARISON:  07/04/2011. FINDINGS: Lower chest: Heart size normal. Visualized lung bases clear. Chronic elevation of the left hemidiaphragm, unchanged. Hepatobiliary:  Hepatic steatosis with focal areas of sparing. Surgically absent gallbladder. No biliary ductal dilation. Pancreas: Normal in appearance without evidence of mass, ductal dilation, or inflammation. Spleen: Normal in size and appearance. Adrenals/Urinary Tract: Normal appearing adrenal glands. Exophytic cyst arising from the lower pole of the right kidney measuring approximately 3 cm, increased in size since the prior CT. Approximate 3.5 cm simple cyst arising from the lower pole of the left kidney, increased in size since the prior CT. Within the limits of the unenhanced technique, no solid renal masses. No hydronephrosis. Nonobstructing very small (1-2 mm) calculus in a lower pole calyx of the left kidney. Urinary bladder mildly distended and unremarkable. Stomach/Bowel: Stomach normal in appearance for the degree of distention. Normal-appearing small bowel. Normal-appearing colon with expected stool burden. Entire colon relatively decompressed. Normal-appearing decompressed appendix in the right upper pelvis. Vascular/Lymphatic: Minimal atherosclerosis involving the abdominal aorta. No pathologic lymphadenopathy. Reproductive: Prostate gland and seminal vesicles normal in appearance. Other: Umbilical hernia containing fat. Musculoskeletal: Remote compression fracture of T12. DISH involving the lower lumbar spine. No acute abnormalities. IMPRESSION: 1. No acute abnormalities involving the abdomen or pelvis. 2. Nonobstructing 1-2 mm calculus in a lower pole calyx of the left kidney. 3. Simple cysts involving the lower poles of both kidneys. 4. Diffuse hepatic steatosis with scattered areas of focal sparing. 5. Umbilical hernia containing fat. Electronically Signed   By: Hulan Saas M.D.   On: 11/25/2016 22:37   Dg Abd 1 View  Result Date: 11/22/2016 CLINICAL DATA:  Abdominal pain. EXAM: ABDOMEN - 1 VIEW COMPARISON:  Ultrasound 07/10/2014.  CT 07/04/2011 report. FINDINGS: Exam limited by technique. Soft tissue  structures are unremarkable. No bowel distention. Degenerative changes lumbar spine and both hips. IMPRESSION: No acute abnormality. Electronically Signed   By: Maisie Fus  Register   On: 11/22/2016 15:35   Dg Toe 3rd Left  Result Date: 11/07/2016 CLINICAL DATA:  Injury third toe EXAM: LEFT FOOT WITH PARTICULAR ATTENTION TO THIRD DIGIT:  3 V COMPARISON:  None. FINDINGS: Frontal, oblique, and lateral views were obtained. There are flexion deformities of the second, third, fourth, and fifth MTP and PIP joints. There is no acute fracture or dislocation. There is no appreciable joint space narrowing or erosion. No bony destruction. There are accessory ossicles medial to the medial cuneiform bone. There is calcification dorsal and anterior to the talus, possibly residua of old trauma. IMPRESSION: No acute fracture or dislocation. Flexion deformities of the second and third, fourth, and fifth MTP and PIP joints. No erosive change or bony destruction. No appreciable joint space narrowing. Electronically Signed   By: Bretta Bang III M.D.   On: 11/07/2016 17:16      Assessment & Plan   1. UTI/pyelonephritis  CT reviewed 2. Acute kidney injury; improving 3. Obesity 4. Diabetes mellitus 5. Allergies: Seem to be chronic and I don't think it's related to Rocephin because of the time element  Plan  IV Rocephin IV fluids Insulin sliding scale Check hemoglobin A1c Check cultures     DVT Prophylaxis Lovenox  AM Labs Ordered, also please review Full Orders  Code Status full  Disposition Plan: Home  Time spent in minutes : 32 minutes  Condition fair @SIGNATURE @

## 2016-11-25 NOTE — Progress Notes (Signed)
Subjective:  By signing my name below, I, Raven Small, attest that this documentation has been prepared under the direction and in the presence of Meredith Staggers, MD.  Electronically Signed: Andrew Au, ED Scribe. 11/25/2016. 12:29 PM.   Patient ID: Evan Hamilton, male    DOB: [birth year], 51 y.o.   MRN: [id number]  HPI   Chief Complaint  Patient presents with  . Follow-up    do not feel any better - today feel worse  . Rash    skin rash  x this AM    HPI Comments: Evan Hamilton is a 51 y.o. male who presents to the [facility] here for f/u. diagnosed with suspect pyelonephritis  4 days ago. Initial injection of rocephin 1 g and started on Levoquin 500 mg qd. Also noted to have elevated creatinine with possible AKI on initial labs on the 2nd. On initial creatine of 2.77 this was repeated 2 days ago with minimal improvement to 2.46. Seen in follow up by Wallis Bamberg on the 1/3. Flank pain was persistent/worse, but some improved abdominal pain, less chills and tolerating food and fluids. He was afebrile but BP 94/62. One view abdomen without acute abnormalities, WBC on CBC had improved from 19.5 to 16.9. He was given 1L of fluids in office at visit 3 days ago. Hydrocodone as needed for pain. Did note some rash on wrist and was advised to take benadryl for possible reaction to rocephin. I called patient 2 days ago and he was improving. BNP was drawn with persistent elevated creatinine. Urine noted last night. Unfortunately e. Coli is resistant to Levaquin as well as Cipro. And sensitive to Cephtriaxone and Augmentin. Normal PSA at initial ov.   Since last visit, pt reports new sore throat, cough and fatigue for the past 2 days. He is producing urine normally with less dysuria and discomfort. He reports abdominal soreness, worse with cough and some improved falnk pain. Pt has been taking 1-2 hydrocodone a day. Pt reports spreading, erythematous rash that has spread to to bilateral  arms, sides, abdomen and upper legs which began last night. He took 3 tablets of benadryl today. Pt was seen 05/2016 for drug reaction vs urticaria. 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 Pt states at that time he underwent many treatments including vasculitis, anti- fungal and anti-histamine.  He denies fever, chills, rhinorrhea, congestion.   Patient Active Problem List   Diagnosis Date Noted  . Acute biliary pancreatitis 07/18/2014   Past Medical History:  Diagnosis Date  . Allergy   . Anxiety and depression   . Diabetes mellitus without complication [facility])    Past Surgical History:  Procedure Laterality Date  . CHOLECYSTECTOMY N/A 07/22/2014   Procedure: LAPAROSCOPIC CHOLECYSTECTOMY WITH INTRAOPERATIVE CHOLANGIOGRAM;  Surgeon: Harriette Bouillon, MD;  Location: [facility] OR;  Service: General;  Laterality: N/A;   Allergies  Allergen Reactions  . Penicillins Hives and Rash   Prior to Admission medications   Medication Sig Start Date End Date Taking? Authorizing Provider  HYDROcodone-acetaminophen (NORCO) 5-325 MG tablet Take 1 tablet by mouth every 6 (six) hours as needed. 11/22/16  Yes Wallis Bamberg, PA-C  levofloxacin (LEVAQUIN) 500 MG tablet Take 1 tablet (500 mg total) by mouth daily. 11/21/16  Yes Shade Flood, MD  loratadine (CLARITIN) 10 MG tablet Take 10 mg by mouth daily.   Yes Historical Provider, MD   Social History   Social History  . Marital status: Single  Spouse name: N/A  . Number of children: 0  . Years of education: college   Occupational History  . Not on file.   Social History Main Topics  . Smoking status: Current Every Day Smoker    Types: Cigars  . Smokeless tobacco: Never Used  . Alcohol use 1.5 oz/week    3 Standard drinks or equivalent per week  . Drug use: No  . Sexual activity: No   Other Topics Concern  . Not on file   Social History Narrative   Drinks "some" soda and a "couple" of 5 hour energy drinks a week.      Review of Systems  Constitutional: Positive for fatigue. Negative for chills and fever.  HENT: Positive for sore throat. Negative for congestion and rhinorrhea.   Respiratory: Positive for cough.   Gastrointestinal: Positive for abdominal distention and abdominal pain.  Genitourinary: Positive for flank pain. Negative for difficulty urinating.  Skin: Positive for rash.       Objective:   Physical Exam  Constitutional: He is oriented to person, place, and time. He appears well-developed and well-nourished. No distress.  HENT:  Mouth/Throat: No posterior oropharyngeal erythema.  Slight white coating along the tongue, not seen elsewhere on oral mucosa.   Neck: Neck supple.  Cardiovascular: Regular rhythm and normal heart sounds.  Tachycardia present.   No murmur heard. Pulmonary/Chest: Effort normal.  Lung sounds distant but clear  Abdominal: Soft. There is tenderness in the right lower quadrant and left lower quadrant. There is tenderness at McBurney's point. There is no CVA tenderness.  Abdomen obese/ protuberant but soft.   Neurological: He is alert and oriented to person, place, and time.  Skin:  multiple scattered patches faint erythema on bilateral arms. Confluent patches along bilateral chest wall and trunk. Few small patches lower abdomen. Few areas of excoriation. Few patches upper thighs bilaterally.   Psychiatric: He has a normal mood and affect. His behavior is normal.     Vitals:   11/25/16 1135  BP: 110/66  Pulse: 98  Resp: 20  Temp: 98.5 F (36.9 C)  TempSrc: Oral  SpO2: 96%  Weight: (!) 379 lb (171.9 kg)  Height: 6\' 2"  (1.88 m)   Results for orders placed or performed in visit on 11/25/16  POCT glucose (manual entry)  Result Value Ref Range   POC Glucose 211 (A) 70 - 99 mg/dl  POCT CBC  Result Value Ref Range   WBC 10.5 (A) 4.6 - 10.2 K/uL   Lymph, poc 1.3 0.6 - 3.4   POC LYMPH PERCENT 12.4 10 - 50 %L   MID (cbc) 0.5 0 - 0.9   POC MID % 4.4 0 - 12  %M   POC Granulocyte 8.7 (A) 2 - 6.9   Granulocyte percent 83.2 (A) 37 - 80 %G   RBC 4.42 (A) 4.69 - 6.13 M/uL   Hemoglobin 13.7 (A) 14.1 - 18.1 g/dL   HCT, POC 45.4 (A) 09.8 - 53.7 %   MCV 87.7 80 - 97 fL   MCH, POC 31.0 27 - 31.2 pg   MCHC 35.3 31.8 - 35.4 g/dL   RDW, POC 11.9 %   Platelet Count, POC 236 142 - 424 K/uL   MPV 6.6 0 - 99.8 fL  POCT urinalysis dipstick  Result Value Ref Range   Color, UA yellow yellow   Clarity, UA clear clear   Glucose, UA negative negative   Bilirubin, UA negative negative   Ketones, POC UA negative  negative   Spec Grav, UA 1.010    Blood, UA large (A) negative   pH, UA 5.5    Protein Ur, POC =30 (A) negative   Urobilinogen, UA 0.2    Nitrite, UA Negative Negative   Leukocytes, UA small (1+) (A) Negative  POCT Microscopic Urinalysis ([facility])  Result Value Ref Range   WBC,UR,HPF,POC Many (A) None WBC/hpf   RBC,UR,HPF,POC Moderate (A) None RBC/hpf   Bacteria Moderate (A) None, Too numerous to count   Mucus Absent Absent   Epithelial Cells, UR Per Microscopy Few (A) None, Too numerous to count cells/hpf  POCT Skin KOH  Result Value Ref Range   Skin KOH, POC Negative      Assessment & Plan:  Evan PartyMark Spanos is a 51 y.o. male Acute pyelonephritis - Plan: POCT CBC, POCT urinalysis dipstick, POCT Microscopic Urinalysis ([facility]) Leukocytosis, unspecified type - Plan: POCT CBC Abdominal pain, RLQ  - Suspected pyelonephritis with persistent pyuria, fatigue. CBC shows slight improvement, but persistent pyuria on WBC, Escherichia coli resistant to Levaquin, and only had one injection of Rocephin which may have caused rash versus Levaquin causing rash/drug eruption. Penicillin allergic.  We'll have evaluated emergency room to decide on IV antibiotics/hospitalization versus outpatient treatment that may need to discuss with infectious disease.  - Right lower quadrant greater than left lower quadrant tenderness. Considered imaging, but with elevated  creatinine, would want to make sure this has improved prior to contrast with CT scanning. This can be discussed in emergency room. Ultrasound also possible initial option.  Fatigue, unspecified type - Plan: POCT CBC, POCT urinalysis dipstick, POCT Microscopic Urinalysis ([facility])  -Worsening fatigue, now with sore throat and cough. Viral illness possible on top of underlying infection. Hyperglycemia as noted below, not currently on diabetic medication. Prior elevated creatinine/AKI, will need to have that rechecked today.  Rash and nonspecific skin eruption  - Drug eruption/urticaria likely. May be due to Levaquin or Rocephin. Of note he did have a similar rash in July 2017 that was thought to be possible drug eruption versus urticaria. Stop Levaquin as resistant Escherichia coli anyway.  Tongue coating - Plan: POCT Skin KOH  - No sign of thrush at this time.  Hyperglycemia - Plan: POCT glucose (manual entry)  - Diabetes, previously diet-controlled, most recent A1c over 7 with plan to discuss medication options, but would not start metformin given elevated creatinine at this time. If hospitalized, may need insulin/sliding-scale.  Likely electrolyte testing in ER to evaluate bicarb. Doubt DKA at current level.  Elevated serum creatinine  - Suspected acute kidney injury, but differential includes possible nephrolith with secondary infection. Imaging as discussed above, but with elevated creatinine, would be cautious with contrast study. Further evaluation through the emergency room.  Over 40 minutes total time and with severity/complexity of illness, decided on emergency room evaluation. Patient sent by private vehicle.  No orders of the defined types were placed in this encounter.  Patient Instructions     Proceed to [facility] emergency room for other testing and to decide if other imaging such as ultrasound or CAT scan can be done. Unfortunately with your rash, that may be due to the  antibiotics or the injection from a few days ago, and based on the urine culture, there are only a few choices of antibiotics to treat the infection with your penicillin allergy. Additionally you will likely need to have your kidney function rechecked in the emergency room as well as discuss treatment for the elevated blood  sugar. There is a potential they may want to admit you with these different conditions.   If you are discharged from emergency room, follow up with [place] within the next 2 days.   IF you received an x-ray today, you will receive an invoice from [facility]. Please contact [facility] at [phone] with questions or concerns regarding your invoice.   IF you received labwork today, you will receive an invoice from [facility]. Please contact [facility] at [phone] with questions or concerns regarding your invoice.   Our billing staff will not be able to assist you with questions regarding bills from these companies.  You will be contacted with the lab results as soon as they are available. The fastest way to get your results is to activate your My Chart account. Instructions are located on the last page of this paperwork. If you have not heard from [place] regarding the results in 2 weeks, please contact this office.         I personally performed the services described in this documentation, which was scribed in my presence. The recorded information has been reviewed and considered, and addended by me as needed.   Signed,   Meredith Staggers, MD Primary Care at [facility].  11/25/16 1:42 PM

## 2016-11-25 NOTE — Patient Instructions (Addendum)
  Proceed to [facility] emergency room for other testing and to decide if other imaging such as ultrasound or CAT scan can be done. Unfortunately with your rash, that may be due to the antibiotics or the injection from a few days ago, and based on the urine culture, there are only a few choices of antibiotics to treat the infection with your penicillin allergy. Additionally you will likely need to have your kidney function rechecked in the emergency room as well as discuss treatment for the elevated blood sugar. There is a potential they may want to admit you with these different conditions.   If you are discharged from emergency room, follow up with us within the next 2 days.   IF you received an x-ray today, you will receive an invoice from [facility]. Please contact [facility] at [phone] with questions or concerns regarding your invoice.   IF you received labwork today, you will receive an invoice from [facility]. Please contact [facility] at [phone] with questions or concerns regarding your invoice.   Our billing staff will not be able to assist you with questions regarding bills from these companies.  You will be contacted with the lab results as soon as they are available. The fastest way to get your results is to activate your My Chart account. Instructions are located on the last page of this paperwork. If you have not heard from us regarding the results in 2 weeks, please contact this office.

## 2016-11-26 ENCOUNTER — Encounter (HOSPITAL_COMMUNITY): Payer: Self-pay | Admitting: *Deleted

## 2016-11-26 DIAGNOSIS — N179 Acute kidney failure, unspecified: Principal | ICD-10-CM

## 2016-11-26 LAB — CBC
HCT: 36.3 % — ABNORMAL LOW (ref 39.0–52.0)
HCT: 35.9 % — ABNORMAL LOW (ref 39.0–52.0)
HEMOGLOBIN: 12 g/dL — AB (ref 13.0–17.0)
Hemoglobin: 11.7 g/dL — ABNORMAL LOW (ref 13.0–17.0)
MCH: 29.4 pg (ref 26.0–34.0)
MCH: 29.6 pg (ref 26.0–34.0)
MCHC: 32.6 g/dL (ref 30.0–36.0)
MCHC: 33.1 g/dL (ref 30.0–36.0)
MCV: 89.4 fL (ref 78.0–100.0)
MCV: 90.2 fL (ref 78.0–100.0)
PLATELETS: 193 10*3/uL (ref 150–400)
PLATELETS: 198 10*3/uL (ref 150–400)
RBC: 3.98 MIL/uL — AB (ref 4.22–5.81)
RBC: 4.06 MIL/uL — ABNORMAL LOW (ref 4.22–5.81)
RDW: 14 % (ref 11.5–15.5)
RDW: 14.1 % (ref 11.5–15.5)
WBC: 8.8 10*3/uL (ref 4.0–10.5)
WBC: 7.1 10*3/uL (ref 4.0–10.5)

## 2016-11-26 LAB — GLUCOSE, CAPILLARY
GLUCOSE-CAPILLARY: 157 mg/dL — AB (ref 65–99)
GLUCOSE-CAPILLARY: 150 mg/dL — AB (ref 65–99)
GLUCOSE-CAPILLARY: 137 mg/dL — AB (ref 65–99)
Glucose-Capillary: 176 mg/dL — ABNORMAL HIGH (ref 65–99)

## 2016-11-26 LAB — BASIC METABOLIC PANEL
Anion gap: 10 (ref 5–15)
BUN: 20 mg/dL (ref 6–20)
CHLORIDE: 102 mmol/L (ref 101–111)
CO2: 24 mmol/L (ref 22–32)
CREATININE: 1.93 mg/dL — AB (ref 0.61–1.24)
Calcium: 8.3 mg/dL — ABNORMAL LOW (ref 8.9–10.3)
GFR calc non Af Amer: 39 mL/min — ABNORMAL LOW (ref >60)
GFR, EST AFRICAN AMERICAN: 45 mL/min — AB (ref >60)
GLUCOSE: 154 mg/dL — AB (ref 65–99)
Potassium: 3.4 mmol/L — ABNORMAL LOW (ref 3.5–5.1)
Sodium: 136 mmol/L (ref 135–145)

## 2016-11-26 LAB — CREATININE, SERUM
CREATININE: 1.96 mg/dL — AB (ref 0.61–1.24)
GFR calc Af Amer: 44 mL/min — ABNORMAL LOW (ref >60)
GFR, EST NON AFRICAN AMERICAN: 38 mL/min — AB (ref >60)

## 2016-11-26 MED ORDER — ENOXAPARIN SODIUM 80 MG/0.8ML ~~LOC~~ SOLN
80.0000 mg | SUBCUTANEOUS | Status: DC
  Administered 2016-11-27 – 2016-11-28 (×2): 80 mg via SUBCUTANEOUS
  Filled 2016-11-26 (×2): qty 0.8

## 2016-11-26 MED ORDER — INSULIN ASPART 100 UNIT/ML ~~LOC~~ SOLN
0.0000 [IU] | Freq: Three times a day (TID) | SUBCUTANEOUS | Status: DC
  Administered 2016-11-26 (×2): 3 [IU] via SUBCUTANEOUS
  Administered 2016-11-26 – 2016-11-27 (×2): 2 [IU] via SUBCUTANEOUS
  Administered 2016-11-27: 3 [IU] via SUBCUTANEOUS
  Administered 2016-11-27: 2 [IU] via SUBCUTANEOUS
  Administered 2016-11-28: 3 [IU] via SUBCUTANEOUS

## 2016-11-26 MED ORDER — SODIUM CHLORIDE 0.9 % IV SOLN
INTRAVENOUS | Status: DC
  Administered 2016-11-26 – 2016-11-27 (×2): via INTRAVENOUS

## 2016-11-26 MED ORDER — ZOLPIDEM TARTRATE 5 MG PO TABS: 5.0000 mg | ORAL_TABLET | Freq: Every evening | ORAL | Status: DC | PRN

## 2016-11-26 MED ORDER — LORATADINE 10 MG PO TABS
10.0000 mg | ORAL_TABLET | Freq: Two times a day (BID) | ORAL | Status: DC
  Administered 2016-11-26 – 2016-11-28 (×5): 10 mg via ORAL
  Filled 2016-11-26 (×5): qty 1

## 2016-11-26 MED ORDER — ACETAMINOPHEN 325 MG PO TABS: 650.0000 mg | ORAL_TABLET | Freq: Four times a day (QID) | ORAL | Status: DC | PRN

## 2016-11-26 MED ORDER — ENOXAPARIN SODIUM 40 MG/0.4ML ~~LOC~~ SOLN
40.0000 mg | SUBCUTANEOUS | Status: DC
  Administered 2016-11-26: 40 mg via SUBCUTANEOUS
  Filled 2016-11-26: qty 0.4

## 2016-11-26 MED ORDER — TRAMADOL HCL 50 MG PO TABS: 50.0000 mg | ORAL_TABLET | Freq: Four times a day (QID) | ORAL | Status: DC | PRN

## 2016-11-26 MED ORDER — DEXTROSE 5 % IV SOLN
2.0000 g | INTRAVENOUS | Status: DC
  Administered 2016-11-26: 2 g via INTRAVENOUS
  Filled 2016-11-26: qty 2

## 2016-11-26 MED ORDER — ACETAMINOPHEN 650 MG RE SUPP: 650.0000 mg | Freq: Four times a day (QID) | RECTAL | Status: DC | PRN

## 2016-11-26 MED ORDER — HYDROCORTISONE 1 % EX CREA
TOPICAL_CREAM | CUTANEOUS | Status: DC | PRN
  Filled 2016-11-26: qty 28

## 2016-11-26 MED ORDER — LORATADINE 10 MG PO TABS
10.0000 mg | ORAL_TABLET | Freq: Every day | ORAL | Status: DC | PRN
  Filled 2016-11-26: qty 1

## 2016-11-26 NOTE — Progress Notes (Signed)
Pharmacy: Lovenox Dose Adjustment for VTE prophylaxis  OBJECTIVE:  Wt: 172 kg   Ht: 74 inches  BMI~48.6 SCr 1.93  CrCl~40-50 ml/min  ASSESSMENT:  50 YOM on lovenox for VTE prophylaxis requiring a dose adjustment for BMI>30 and CrCl>30 ml/min  PLAN:  1. Adjust Lovenox to 80 mg (~0.5 mg/kg - rounded to syringe size) SQ every 24 hours 2. Pharmacy will monitor peripherally for s/sx of bleeding and any necessary dose adjustments  Thank you for allowing pharmacy to be a part of this patient's care.  Georgina PillionElizabeth Jaqueline Uber, PharmD, BCPS Clinical Pharmacist Pager: [phone] 11/26/2016 3:52 PM

## 2016-11-26 NOTE — Progress Notes (Addendum)
NURSING PROGRESS NOTE  Evan PartyMark Hamilton [id number] Admitted to 5521: 11/25/2016 Attending Provider: Carron CurieAli Hijazi, MD    Evan PartyMark Hamilton is a 51 y.o. male patient admitted from ED awake, alert  & orientated  X 4,  Full Code, VSS - Blood pressure 112/62, pulse 92, temperature 99 F (37.2 C), temperature source Oral, resp. rate 24, height 6\' 2"  (1.88 m), weight (!) 171.8 kg (378 lb 12.8 oz), SpO2 98 %., O2  RA, no c/o shortness of breath, no c/o chest pain, no distress noted, nontele.   IV site WDL:  antecubital left, condition patent and no redness with a transparent dsg that's clean dry and intact.  Allergies:   Allergies  Allergen Reactions  . Penicillins Hives and Rash     Past Medical History:  Diagnosis Date  . Allergy   . Anxiety and depression   . Diabetes mellitus without complication (HCC)     History:  obtained from chart review and the patient.  Pt orientation to unit, room and routine. Information packet given to patient/family and safety video watched.  Admission INP armband ID verified with patient/family, and in place. SR up x 2, fall risk assessment complete with Patient and family verbalizing understanding of risks associated with falls. Pt verbalizes an understanding of how to use the call bell and to call for help before getting out of bed.  Skin, clean-dry- intact generalized rash to torso, groin, BUE & BLE patient states he has had this rash intermittently since June 2017 that he has been seeing a dermatologist for. Abrasion to left 2nd toe states he banged it the elliptical at home.    Will cont to monitor and assist as needed.  Julien NordmannJackson, Rajan Burgard [place], RN 11/26/2016

## 2016-11-26 NOTE — Progress Notes (Signed)
PROGRESS NOTE    Evan Hamilton  WNU:[id number] DOB: 06/08/1966 DOA: 11/25/2016 PCP: Beverly Sessions    Brief Narrative:  51 year old male Diabetes mellitus type 2 Bipolar Allergies Obstructive sleep apnea on BiPAP Prior history gallstone pancreatitis had cholecysticmy 9/15   Admitted with failed outpatient management of pyelonephritis-issues started 12/23 and perssited with fever and chills and back pain Urine was dark and cloudy Has had prior Uti-looked poorly, no abd pain Developed a cough as well 4 days prior top admit and devleoped cough recieve3d on 11/21/16 and was weak  Received some rocephin IM and wa s given oral abx asd well  Assessment & Plan:   Active Problems:   AKI (acute kidney injury) (HCC)   Pyelonephritis   Escherichia coli pyelonephritis based on cultures 11/22/16-treated as an outpatient at [place]-shot of Rocephin, Levaquin Rx given. Initial WBC 19--16.  However E coli resistant to Levaquin and Cipro--continue Rocephin for now. Expect can transition to Augmentin in 1-2 days Likely viral bronchitis-CT of abdomen pelvis did not indicate any basal lung involvement and was negative overall. Monitor Acute kidney injury-admin peak at 2.4 on 11/23/16 as an outpatient and this trended down to 1.9.  We will monitor. Not sure how to interpret his kidney function given his massive girth OSA-recently started on BiPAP by Dr. Gilmore Laroche as an outpatient-he is unable to tolerate this at bedtime so I prescribed nightly oxygen Diabetes mellitus type 2-this is reported however seems to be diet controlled. We will monitor. He may need outpatient medication determination     DVT prophylaxis: Lovenox Code Status: Full Family Communication: None present Disposition Plan: Stay in hospital at least 24-48 more hours until resolves   Consultants:     Procedures:     Antimicrobials:   Ceftriaxone 1/6    Subjective: Doing fair. Alert and oriented. Somewhat comfortable. Still  coughing somewhat but no real other issue Overall feels improved able to tolerate diet  Objective: Vitals:   11/25/16 2330 11/25/16 2357 11/26/16 0011 11/26/16 0525  BP: 125/80 112/62  107/62  Pulse: 92 92  82  Resp: 24   18  Temp:  99 F (37.2 C)  98.1 F (36.7 C)  TempSrc:  Oral  Oral  SpO2: 94% 98%  95%  Weight:   (!) 171.8 kg (378 lb 12.8 oz)   Height:   6\' 2"  (1.88 m)     Intake/Output Summary (Last 24 hours) at 11/26/16 1148 Last data filed at 11/26/16 0653  Gross per 24 hour  Intake           1512.5 ml  Output                0 ml  Net           1512.5 ml   Filed Weights   11/26/16 0011  Weight: (!) 171.8 kg (378 lb 12.8 oz)    Examination:  General exam: Appears calm and comfortable Body mass index is 48.64 kg/m. Respiratory system: Clear to auscultation. No rales or rub Cardiovascular system: S1 & S2 heard, RRR. No JVD Gastrointestinal system: Obese Abdomen is nondistended, soft and nontender. Not appreciate organomegaly, no CVA tenderness Central nervous system: Alert and oriented. No focal neurological deficits. Extremities: Symmetric 5 x 5 power. Skin: No rashes, lesions or ulcers Psychiatry: Judgement and insight appear normal. Mood & affect appropriate.     Data Reviewed: I have personally reviewed following labs and imaging studies  CBC:  Recent Labs Lab 11/22/16 1437 11/25/16 1323  11/25/16 1655 11/26/16 0022 11/26/16 0507  WBC 16.9* 10.5* 9.7 8.8 7.1  HGB 13.8* 13.7* 13.2 12.0* 11.7*  HCT 39.2* 38.7* 37.8* 36.3* 35.9*  MCV 88.3 87.7 88.3 89.4 90.2  PLT  --   --  214 198 193   Basic Metabolic Panel:  Recent Labs Lab 11/21/16 1146 11/23/16 1638 11/25/16 1655 11/26/16 0022 11/26/16 0507  NA 133* 139 133*  --  136  K 4.6 4.2 3.8  --  3.4*  CL 91* 98 99*  --  102  CO2 24 22 23   --  24  GLUCOSE 249* 190* 193*  --  154*  BUN 29* 35* 22*  --  20  CREATININE 2.77* 2.46* 2.00* 1.96* 1.93*  CALCIUM 8.4* 8.2* 8.5*  --  8.3*    GFR: Estimated Creatinine Clearance (by C-G formula based on SCr of 1.93 mg/dL (H)) Male: 16.1 mL/min Male: 77.3 mL/min Liver Function Tests:  Recent Labs Lab 11/25/16 1655  AST 43*  ALT 46  ALKPHOS 59  BILITOT 0.6  PROT 7.6  ALBUMIN 2.5*    Recent Labs Lab 11/25/16 1655  LIPASE 21   No results for input(s): AMMONIA in the last 168 hours. Coagulation Profile: No results for input(s): INR, PROTIME in the last 168 hours. Cardiac Enzymes: No results for input(s): CKTOTAL, CKMB, CKMBINDEX, TROPONINI in the last 168 hours. BNP (last 3 results) No results for input(s): PROBNP in the last 8760 hours. HbA1C: No results for input(s): HGBA1C in the last 72 hours. CBG:  Recent Labs Lab 11/26/16 0816  GLUCAP 157*   Lipid Profile: No results for input(s): CHOL, HDL, LDLCALC, TRIG, CHOLHDL, LDLDIRECT in the last 72 hours. Thyroid Function Tests: No results for input(s): TSH, T4TOTAL, FREET4, T3FREE, THYROIDAB in the last 72 hours. Anemia Panel:  Recent Labs  11/23/16 1638  FERRITIN 1,251*  TIBC 163*  IRON 31*   Sepsis Labs: No results for input(s): PROCALCITON, LATICACIDVEN in the last 168 hours.  Recent Results (from the past 240 hour(s))  Urine culture     Status: Abnormal   Collection Time: 11/21/16 12:32 PM  Result Value Ref Range Status   Urine Culture, Routine Final report (A)  Final   Urine Culture result 1 Escherichia coli (A)  Final    Comment: Greater than 100,000 colony forming units per mL Cefazolin <=4 ug/mL Cefazolin with an MIC <=16 predicts susceptibility to the oral agents cefaclor, cefdinir, cefpodoxime, cefprozil, cefuroxime, cephalexin, and loracarbef when used for therapy of uncomplicated urinary tract infections due to E. coli, Klebsiella pneumoniae, and Proteus mirabilis.    ANTIMICROBIAL SUSCEPTIBILITY Comment  Final    Comment:       ** S = Susceptible; I = Intermediate; R = Resistant **                    P = Positive; N =  Negative             MICS are expressed in micrograms per mL    Antibiotic                 RSLT#1    RSLT#2    RSLT#3    RSLT#4 Amoxicillin/Clavulanic Acid    S Ampicillin                     R Cefepime                       S  Ceftriaxone                    S Cefuroxime                     S Cephalothin                    I Ciprofloxacin                  R Ertapenem                      S Gentamicin                     S Imipenem                       S Levofloxacin                   R Nitrofurantoin                 S Piperacillin                   R Tetracycline                   S Tobramycin                     S Trimethoprim/Sulfa             R          Radiology Studies: Ct Abdomen Pelvis Wo Contrast  Result Date: 11/25/2016 CLINICAL DATA:  51 year old with acute onset of right lower quadrant abdominal pain. Intravenous contrast not administered due to an elevated creatinine of 2.00. EXAM: CT ABDOMEN AND PELVIS WITHOUT CONTRAST TECHNIQUE: Multidetector CT imaging of the abdomen and pelvis was performed following the standard protocol without IV contrast. COMPARISON:  07/04/2011. FINDINGS: Lower chest: Heart size normal. Visualized lung bases clear. Chronic elevation of the left hemidiaphragm, unchanged. Hepatobiliary: Hepatic steatosis with focal areas of sparing. Surgically absent gallbladder. No biliary ductal dilation. Pancreas: Normal in appearance without evidence of mass, ductal dilation, or inflammation. Spleen: Normal in size and appearance. Adrenals/Urinary Tract: Normal appearing adrenal glands. Exophytic cyst arising from the lower pole of the right kidney measuring approximately 3 cm, increased in size since the prior CT. Approximate 3.5 cm simple cyst arising from the lower pole of the left kidney, increased in size since the prior CT. Within the limits of the unenhanced technique, no solid renal masses. No hydronephrosis. Nonobstructing very small (1-2 mm) calculus in a  lower pole calyx of the left kidney. Urinary bladder mildly distended and unremarkable. Stomach/Bowel: Stomach normal in appearance for the degree of distention. Normal-appearing small bowel. Normal-appearing colon with expected stool burden. Entire colon relatively decompressed. Normal-appearing decompressed appendix in the right upper pelvis. Vascular/Lymphatic: Minimal atherosclerosis involving the abdominal aorta. No pathologic lymphadenopathy. Reproductive: Prostate gland and seminal vesicles normal in appearance. Other: Umbilical hernia containing fat. Musculoskeletal: Remote compression fracture of T12. DISH involving the lower lumbar spine. No acute abnormalities. IMPRESSION: 1. No acute abnormalities involving the abdomen or pelvis. 2. Nonobstructing 1-2 mm calculus in a lower pole calyx of the left kidney. 3. Simple cysts involving the lower poles of both kidneys. 4. Diffuse hepatic steatosis with scattered areas of focal sparing. 5. Umbilical hernia containing fat. Electronically Signed   By: Kayren Eaveshomas  Lawrence M.D.  On: 11/25/2016 22:37        Scheduled Meds: . cefTRIAXone (ROCEPHIN)  IV  2 g Intravenous Q24H  . enoxaparin (LOVENOX) injection  40 mg Subcutaneous Q24H  . insulin aspart  0-15 Units Subcutaneous TID WC  . loratadine  10 mg Oral BID   Continuous Infusions: . sodium chloride 75 mL/hr at 11/26/16 0043     LOS: 1 day    Time spent: 73    Rhetta Mura, MD [facility] Pager [phone]  If 7PM-7AM, please contact night-coverage [email or web address] Password [email or web address] 11/26/2016, 11:48 AM

## 2016-11-27 LAB — GLUCOSE, CAPILLARY
GLUCOSE-CAPILLARY: 129 mg/dL — AB (ref 65–99)
GLUCOSE-CAPILLARY: 160 mg/dL — AB (ref 65–99)
Glucose-Capillary: 132 mg/dL — ABNORMAL HIGH (ref 65–99)
Glucose-Capillary: 143 mg/dL — ABNORMAL HIGH (ref 65–99)

## 2016-11-27 LAB — HEMOGLOBIN A1C
Hgb A1c MFr Bld: 8 % — ABNORMAL HIGH (ref 4.8–5.6)
Mean Plasma Glucose: 183 mg/dL

## 2016-11-27 LAB — CBC
HEMATOCRIT: 42.6 % (ref 39.0–52.0)
HEMOGLOBIN: 14.3 g/dL (ref 13.0–17.0)
MCH: 30.2 pg (ref 26.0–34.0)
MCHC: 33.6 g/dL (ref 30.0–36.0)
MCV: 90.1 fL (ref 78.0–100.0)
Platelets: 148 10*3/uL — ABNORMAL LOW (ref 150–400)
RBC: 4.73 MIL/uL (ref 4.22–5.81)
RDW: 13.9 % (ref 11.5–15.5)
WBC: 5.9 10*3/uL (ref 4.0–10.5)

## 2016-11-27 LAB — BASIC METABOLIC PANEL
Anion gap: 6 (ref 5–15)
BUN: 14 mg/dL (ref 6–20)
CALCIUM: 8.1 mg/dL — AB (ref 8.9–10.3)
CO2: 26 mmol/L (ref 22–32)
CREATININE: 1.6 mg/dL — AB (ref 0.61–1.24)
Chloride: 107 mmol/L (ref 101–111)
GFR calc non Af Amer: 49 mL/min — ABNORMAL LOW (ref >60)
GFR, EST AFRICAN AMERICAN: 57 mL/min — AB (ref >60)
Glucose, Bld: 149 mg/dL — ABNORMAL HIGH (ref 65–99)
Potassium: 3.7 mmol/L (ref 3.5–5.1)
Sodium: 139 mmol/L (ref 135–145)

## 2016-11-27 LAB — URINE CULTURE: Culture: <10000 — AB

## 2016-11-27 MED ORDER — CEFUROXIME AXETIL 500 MG PO TABS
500.0000 mg | ORAL_TABLET | Freq: Two times a day (BID) | ORAL | Status: DC
  Administered 2016-11-27 – 2016-11-28 (×2): 500 mg via ORAL
  Filled 2016-11-27 (×4): qty 1

## 2016-11-27 NOTE — Progress Notes (Signed)
PROGRESS NOTE    Evan Hamilton  RUE:[id number] DOB: 09/07/1966 DOA: 11/25/2016 PCP: Evan Hamilton    Brief Narrative:  51 year old male Diabetes mellitus type 2 Bipolar Allergies Obstructive sleep apnea on BiPAP Prior history gallstone pancreatitis had cholecysticmy 9/15   Admitted with failed outpatient management of pyelonephritis-issues started 12/23 and perssited with fever and chills and back pain Urine was dark and cloudy Has had prior Uti-looked poorly, no abd pain Developed a cough as well 4 days prior top admit and devleoped cough recieve3d on 11/21/16 and was weak  Received some rocephin IM and wa s given oral abx asd well  Assessment & Plan:   Active Problems:   AKI (acute kidney injury) (HCC)   Pyelonephritis   Escherichia coli pyelonephritis based on cultures 11/22/16-treated as an outpatient at [place]-shot of Rocephin, Levaquin Rx given. Initial WBC 19--16.  However E coli resistant to Levaquin and Cipro--continue Rocephin --transitioned on 1/8 to by mouth Ceftin 500 twice a day Likely viral bronchitis-CT of abdomen pelvis did not indicate any basal lung involvement and was negative overall. Monitor Acute kidney injury-admin peak at 2.4 on 11/23/16 as an outpatient and this trended down to 1.9--1.6.  We will monitor.  OSA-recently started on BiPAP by Dr. Gilmore Hamilton as an outpatient-he is unable to tolerate this at bedtime so I prescribed nightly oxygen Diabetes mellitus type 2-this is reported however seems to be diet controlled. We will monitor. He may need outpatient medication determination     DVT prophylaxis: Lovenox Code Status: Full Family Communication: None present Disposition Plan: Stay in hospital at least 24-48 more hours until resolves   Consultants:     Procedures:     Antimicrobials:   Ceftriaxone 1/6 -1/8  Ceftin 1/8   Subjective:  Been fair. No pain in back or abdomen A little bit of rash or No chest pain Mild cough No other  issues  Objective: Vitals:   11/26/16 1529 11/26/16 1546 11/26/16 2312 11/27/16 0443  BP: 119/73  (!) 98/54 (!) 116/58  Pulse: 86  94 95  Resp: 14  20 18   Temp: 97.9 F (36.6 C)  98.2 F (36.8 C) 98 F (36.7 C)  TempSrc: Oral  Oral Oral  SpO2: 100%  98% 94%  Weight:  (!) 171.9 kg (379 lb)    Height:        Intake/Output Summary (Last 24 hours) at 11/27/16 1018 Last data filed at 11/26/16 1500  Gross per 24 hour  Intake           608.75 ml  Output                0 ml  Net           608.75 ml   Filed Weights   11/26/16 0011 11/26/16 1546  Weight: (!) 171.8 kg (378 lb 12.8 oz) (!) 171.9 kg (379 lb)    Examination:  General exam: Appears calm and comfortable Body mass index is 48.66 kg/m. Respiratory system: Clear to auscultation. No rales or rub Cardiovascular system: S1 & S2 heard, RRR. No JVD Gastrointestinal system: Obese Abdomen is Nontender but slightly painful on pressure suprapubically CVA tenderness Central nervous system: Alert and oriented. No focal neurological deficits. Extremities: Symmetric 5 x 5 power. Skin: No rashes, lesions or ulcers Psychiatry: Judgement and insight appear normal. Mood & affect appropriate.     Data Reviewed: I have personally reviewed following labs and imaging studies  CBC:  Recent Labs Lab 11/25/16 1323 11/25/16 1655  11/26/16 0022 11/26/16 0507 11/27/16 0821  WBC 10.5* 9.7 8.8 7.1 5.9  HGB 13.7* 13.2 12.0* 11.7* 14.3  HCT 38.7* 37.8* 36.3* 35.9* 42.6  MCV 87.7 88.3 89.4 90.2 90.1  PLT  --  214 198 193 148*   Basic Metabolic Panel:  Recent Labs Lab 11/21/16 1146 11/23/16 1638 11/25/16 1655 11/26/16 0022 11/26/16 0507 11/27/16 0821  NA 133* 139 133*  --  136 139  K 4.6 4.2 3.8  --  3.4* 3.7  CL 91* 98 99*  --  102 107  CO2 24 22 23   --  24 26  GLUCOSE 249* 190* 193*  --  154* 149*  BUN 29* 35* 22*  --  20 14  CREATININE 2.77* 2.46* 2.00* 1.96* 1.93* 1.60*  CALCIUM 8.4* 8.2* 8.5*  --  8.3* 8.1*    GFR: Estimated Creatinine Clearance (by C-G formula based on SCr of 1.6 mg/dL (H)) Male: 32.977.5 mL/min Male: 93.3 mL/min Liver Function Tests:  Recent Labs Lab 11/25/16 1655  AST 43*  ALT 46  ALKPHOS 59  BILITOT 0.6  PROT 7.6  ALBUMIN 2.5*    Recent Labs Lab 11/25/16 1655  LIPASE 21   No results for input(s): AMMONIA in the last 168 hours. Coagulation Profile: No results for input(s): INR, PROTIME in the last 168 hours. Cardiac Enzymes: No results for input(s): CKTOTAL, CKMB, CKMBINDEX, TROPONINI in the last 168 hours. BNP (last 3 results) No results for input(s): PROBNP in the last 8760 hours. HbA1C:  Recent Labs  11/26/16 0507  HGBA1C 8.0*   CBG:  Recent Labs Lab 11/26/16 0816 11/26/16 1213 11/26/16 1752 11/26/16 2316 11/27/16 0801  GLUCAP 157* 176* 150* 137* 129*   Lipid Profile: No results for input(s): CHOL, HDL, LDLCALC, TRIG, CHOLHDL, LDLDIRECT in the last 72 hours. Thyroid Function Tests: No results for input(s): TSH, T4TOTAL, FREET4, T3FREE, THYROIDAB in the last 72 hours. Anemia Panel: No results for input(s): VITAMINB12, FOLATE, FERRITIN, TIBC, IRON, RETICCTPCT in the last 72 hours. Sepsis Labs: No results for input(s): PROCALCITON, LATICACIDVEN in the last 168 hours.  Recent Results (from the past 240 hour(s))  Urine culture     Status: Abnormal   Collection Time: 11/21/16 12:32 PM  Result Value Ref Range Status   Urine Culture, Routine Final report (A)  Final   Urine Culture result 1 Escherichia coli (A)  Final    Comment: Greater than 100,000 colony forming units per mL Cefazolin <=4 ug/mL Cefazolin with an MIC <=16 predicts susceptibility to the oral agents cefaclor, cefdinir, cefpodoxime, cefprozil, cefuroxime, cephalexin, and loracarbef when used for therapy of uncomplicated urinary tract infections due to E. coli, Klebsiella pneumoniae, and Proteus mirabilis.    ANTIMICROBIAL SUSCEPTIBILITY Comment  Final    Comment:        ** S = Susceptible; I = Intermediate; R = Resistant **                    P = Positive; N = Negative             MICS are expressed in micrograms per mL    Antibiotic                 RSLT#1    RSLT#2    RSLT#3    RSLT#4 Amoxicillin/Clavulanic Acid    S Ampicillin                     R Cefepime  S Ceftriaxone                    S Cefuroxime                     S Cephalothin                    I Ciprofloxacin                  R Ertapenem                      S Gentamicin                     S Imipenem                       S Levofloxacin                   R Nitrofurantoin                 S Piperacillin                   R Tetracycline                   S Tobramycin                     S Trimethoprim/Sulfa             R   Urine culture     Status: Abnormal   Collection Time: 11/25/16  5:25 PM  Result Value Ref Range Status   Specimen Description URINE, CLEAN CATCH  Final   Special Requests ADDED 0046 11/26/16  Final   Culture <10,000 COLONIES/mL INSIGNIFICANT GROWTH (A)  Final   Report Status 11/27/2016 FINAL  Final         Radiology Studies: Ct Abdomen Pelvis Wo Contrast  Result Date: 11/25/2016 CLINICAL DATA:  51 year old with acute onset of right lower quadrant abdominal pain. Intravenous contrast not administered due to an elevated creatinine of 2.00. EXAM: CT ABDOMEN AND PELVIS WITHOUT CONTRAST TECHNIQUE: Multidetector CT imaging of the abdomen and pelvis was performed following the standard protocol without IV contrast. COMPARISON:  07/04/2011. FINDINGS: Lower chest: Heart size normal. Visualized lung bases clear. Chronic elevation of the left hemidiaphragm, unchanged. Hepatobiliary: Hepatic steatosis with focal areas of sparing. Surgically absent gallbladder. No biliary ductal dilation. Pancreas: Normal in appearance without evidence of mass, ductal dilation, or inflammation. Spleen: Normal in size and appearance. Adrenals/Urinary Tract: Normal appearing  adrenal glands. Exophytic cyst arising from the lower pole of the right kidney measuring approximately 3 cm, increased in size since the prior CT. Approximate 3.5 cm simple cyst arising from the lower pole of the left kidney, increased in size since the prior CT. Within the limits of the unenhanced technique, no solid renal masses. No hydronephrosis. Nonobstructing very small (1-2 mm) calculus in a lower pole calyx of the left kidney. Urinary bladder mildly distended and unremarkable. Stomach/Bowel: Stomach normal in appearance for the degree of distention. Normal-appearing small bowel. Normal-appearing colon with expected stool burden. Entire colon relatively decompressed. Normal-appearing decompressed appendix in the right upper pelvis. Vascular/Lymphatic: Minimal atherosclerosis involving the abdominal aorta. No pathologic lymphadenopathy. Reproductive: Prostate gland and seminal vesicles normal in appearance. Other: Umbilical hernia containing fat. Musculoskeletal: Remote compression fracture of T12. DISH involving the lower lumbar spine. No  acute abnormalities. IMPRESSION: 1. No acute abnormalities involving the abdomen or pelvis. 2. Nonobstructing 1-2 mm calculus in a lower pole calyx of the left kidney. 3. Simple cysts involving the lower poles of both kidneys. 4. Diffuse hepatic steatosis with scattered areas of focal sparing. 5. Umbilical hernia containing fat. Electronically Signed   By: Hulan Saas M.D.   On: 11/25/2016 22:37        Scheduled Meds: . cefTRIAXone (ROCEPHIN)  IV  2 g Intravenous Q24H  . enoxaparin (LOVENOX) injection  80 mg Subcutaneous Q24H  . insulin aspart  0-15 Units Subcutaneous TID WC  . loratadine  10 mg Oral BID   Continuous Infusions: . sodium chloride 75 mL/hr at 11/27/16 0248     LOS: 2 days    Time spent: 75    Rhetta Mura, MD [facility] Pager [phone]  If 7PM-7AM, please contact night-coverage [email or web address] Password  [email or web address] 11/27/2016, 10:18 AM

## 2016-11-27 NOTE — Care Management Note (Addendum)
Case Management Note  Patient Details  Name: Evan Hamilton MRN: [id number] Date of Birth: 27-Oct-1966  Subjective/Objective:                 Admitted with failed outpatient management of pyelonephritis-issues started 12/23 and presents with persistent fever and chills and back pain. From home with family. Independent with ADL's , no DME usage.  PCP: Wallis BambergMario Mani  Action/Plan: Plan is to d/c to home tomorrow. No present needs identified per CM.  Expected Discharge Date:  11/28/16               Expected Discharge Plan:  Home/Self Care.  In-House Referral:     Discharge planning Services  CM Consult  Post Acute Care Choice:    Choice offered to:     DME Arranged:    DME Agency:     HH Arranged:    HH Agency:     Status of Service:  Completed, signed off  If discussed at Long Length of Stay Meetings, dates discussed:    Additional Comments:  Epifanio LeschesCole, Nelma Phagan Hudson, RN 11/27/2016, 9:42 AM

## 2016-11-28 DIAGNOSIS — N12 Tubulo-interstitial nephritis, not specified as acute or chronic: Secondary | ICD-10-CM

## 2016-11-28 LAB — GLUCOSE, CAPILLARY
GLUCOSE-CAPILLARY: 111 mg/dL — AB (ref 65–99)
GLUCOSE-CAPILLARY: 165 mg/dL — AB (ref 65–99)

## 2016-11-28 MED ORDER — CEFUROXIME AXETIL 500 MG PO TABS: 500.0000 mg | ORAL_TABLET | Freq: Two times a day (BID) | ORAL | 0 refills | Status: DC

## 2016-11-28 NOTE — Discharge Summary (Signed)
Physician Discharge Summary  Evan Hamilton BJY:[id number] DOB: Nov 01, 1966 DOA: 11/25/2016  PCP: Wallis Bamberg, PA-C--is seen here only for urgent and not primary care  Admit date: 11/25/2016 Discharge date: 11/28/2016  Time spent: 40 minutes  Recommendations for Outpatient Follow-up:  1. Completes Ceftin 11/30/16 2. Needs repeat a metabolic panel in 1 week secondary to acute kidney injury on admission  Discharge Diagnoses:  Active Problems:   AKI (acute kidney injury) [facility])   Pyelonephritis   Discharge Condition: Improved  Diet recommendation: Heart healthy  Filed Weights   11/26/16 0011 11/26/16 1546  Weight: (!) 171.8 kg (378 lb 12.8 oz) (!) 171.9 kg (379 lb)    History of present illness:   51 year old male Diabetes mellitus type 2 Bipolar Allergies Obstructive sleep apnea on BiPAP Prior history gallstone pancreatitis had cholecysticmy 9/15     Admitted with failed outpatient management of pyelonephritis-issues started 12/23 and perssited with fever and chills and back pain Urine was dark and cloudy Has had prior Uti-looked poorly, no abd pain Developed a cough as well 4 days prior top admit and devleoped cough recieve3d on 11/21/16 and was weak  Received some rocephin IM and wa s given oral abx asd well    Hospital Course:  Escherichia coli pyelonephritis based on cultures 11/22/16-treated as an outpatient at [place]-shot of Rocephin, Levaquin Rx given. Initial WBC 19--16.  However E coli resistant to Levaquin and Cipro--continue Rocephin --transitioned on 1/8 to by mouth Ceftin 500 twice a day-as afebrile has discharged home Likely viral bronchitis-CT of abdomen pelvis did not indicate any basal lung involvement and was negative overall. Monitor Acute kidney injury-admin peak at 2.4 on 11/23/16 as an outpatient and this trended down to 1.9--1.6.   needs outpatient labs OSA-recently started on BiPAP by Dr. Gilmore Laroche as an outpatient-he is unable to tolerate this at bedtime so I  prescribed nightly oxygen Diabetes mellitus type 2-this is reported however seems to be diet controlled. We will monitor. He will need a primary care physician to talk to him about    Discharge Exam: Vitals:   11/27/16 2157 11/28/16 0606  BP: 112/66 119/75  Pulse: 100 88  Resp: 18 18  Temp: 98.6 F (37 C) 98 F (36.7 C)    General: EOMI NCAT morbidly obese Cardiovascular: S1-S2 no murmur rub or gallop Respiratory: Clinically clear No CVA tenderness rash on lower extremities   Discharge Instructions   Discharge Instructions    Diet - low sodium heart healthy    Complete by:  As directed    Discharge instructions    Complete by:  As directed    You will only need 2 more days of the antibiotic Ceftin 500 twice daily please pick this up from the pharmacy. I do not think you need long-term potassium replacement soap these make sure that you get your labs checked in about a week Please follow-up with her primary physician and you might need prostate screening as an outpatient   Increase activity slowly    Complete by:  As directed      Current Discharge Medication List    START taking these medications   Details  cefUROXime (CEFTIN) 500 MG tablet Take 1 tablet (500 mg total) by mouth 2 (two) times daily with a meal. Qty: 4 tablet, Refills: 0      CONTINUE these medications which have NOT CHANGED   Details  diphenhydrAMINE (BENADRYL) 25 MG tablet Take 25 mg by mouth every 6 (six) hours as needed for itching or  allergies.    Ginkgo Biloba Extract (GNP GINGKO BILOBA EXTRACT) 60 MG CAPS Take 60 mg by mouth daily.    loratadine (CLARITIN) 10 MG tablet Take 10 mg by mouth 2 (two) times daily.    Multiple Vitamin (MULTIVITAMIN WITH MINERALS) TABS tablet Take 1 tablet by mouth daily.    Potassium Gluconate 595 MG CAPS Take 595 mg by mouth daily.      STOP taking these medications     levofloxacin (LEVAQUIN) 500 MG tablet      Probiotic Product (PROBIOTIC DAILY) CAPS         Allergies  Allergen Reactions  . Penicillins Hives and Rash    Tolerates Cephalosporins      The results of significant diagnostics from this hospitalization (including imaging, microbiology, ancillary and laboratory) are listed below for reference.    Significant Diagnostic Studies: Ct Abdomen Pelvis Wo Contrast  Result Date: 11/25/2016 CLINICAL DATA:  51 year old with acute onset of right lower quadrant abdominal pain. Intravenous contrast not administered due to an elevated creatinine of 2.00. EXAM: CT ABDOMEN AND PELVIS WITHOUT CONTRAST TECHNIQUE: Multidetector CT imaging of the abdomen and pelvis was performed following the standard protocol without IV contrast. COMPARISON:  07/04/2011. FINDINGS: Lower chest: Heart size normal. Visualized lung bases clear. Chronic elevation of the left hemidiaphragm, unchanged. Hepatobiliary: Hepatic steatosis with focal areas of sparing. Surgically absent gallbladder. No biliary ductal dilation. Pancreas: Normal in appearance without evidence of mass, ductal dilation, or inflammation. Spleen: Normal in size and appearance. Adrenals/Urinary Tract: Normal appearing adrenal glands. Exophytic cyst arising from the lower pole of the right kidney measuring approximately 3 cm, increased in size since the prior CT. Approximate 3.5 cm simple cyst arising from the lower pole of the left kidney, increased in size since the prior CT. Within the limits of the unenhanced technique, no solid renal masses. No hydronephrosis. Nonobstructing very small (1-2 mm) calculus in a lower pole calyx of the left kidney. Urinary bladder mildly distended and unremarkable. Stomach/Bowel: Stomach normal in appearance for the degree of distention. Normal-appearing small bowel. Normal-appearing colon with expected stool burden. Entire colon relatively decompressed. Normal-appearing decompressed appendix in the right upper pelvis. Vascular/Lymphatic: Minimal atherosclerosis involving the  abdominal aorta. No pathologic lymphadenopathy. Reproductive: Prostate gland and seminal vesicles normal in appearance. Other: Umbilical hernia containing fat. Musculoskeletal: Remote compression fracture of T12. DISH involving the lower lumbar spine. No acute abnormalities. IMPRESSION: 1. No acute abnormalities involving the abdomen or pelvis. 2. Nonobstructing 1-2 mm calculus in a lower pole calyx of the left kidney. 3. Simple cysts involving the lower poles of both kidneys. 4. Diffuse hepatic steatosis with scattered areas of focal sparing. 5. Umbilical hernia containing fat. Electronically Signed   By: Hulan Saashomas  Lawrence M.D.   On: 11/25/2016 22:37   Dg Abd 1 View  Result Date: 11/22/2016 CLINICAL DATA:  Abdominal pain. EXAM: ABDOMEN - 1 VIEW COMPARISON:  Ultrasound 07/10/2014.  CT 07/04/2011 report. FINDINGS: Exam limited by technique. Soft tissue structures are unremarkable. No bowel distention. Degenerative changes lumbar spine and both hips. IMPRESSION: No acute abnormality. Electronically Signed   By: Maisie Fushomas  Register   On: 11/22/2016 15:35   Dg Toe 3rd Left  Result Date: 11/07/2016 CLINICAL DATA:  Injury third toe EXAM: LEFT FOOT WITH PARTICULAR ATTENTION TO THIRD DIGIT:  3 V COMPARISON:  None. FINDINGS: Frontal, oblique, and lateral views were obtained. There are flexion deformities of the second, third, fourth, and fifth MTP and PIP joints. There is no acute fracture  or dislocation. There is no appreciable joint space narrowing or erosion. No bony destruction. There are accessory ossicles medial to the medial cuneiform bone. There is calcification dorsal and anterior to the talus, possibly residua of old trauma. IMPRESSION: No acute fracture or dislocation. Flexion deformities of the second and third, fourth, and fifth MTP and PIP joints. No erosive change or bony destruction. No appreciable joint space narrowing. Electronically Signed   By: Bretta Bang III M.D.   On: 11/07/2016 17:16     Microbiology: Recent Results (from the past 240 hour(s))  Urine culture     Status: Abnormal   Collection Time: 11/21/16 12:32 PM  Result Value Ref Range Status   Urine Culture, Routine Final report (A)  Final   Urine Culture result 1 Escherichia coli (A)  Final    Comment: Greater than 100,000 colony forming units per mL Cefazolin <=4 ug/mL Cefazolin with an MIC <=16 predicts susceptibility to the oral agents cefaclor, cefdinir, cefpodoxime, cefprozil, cefuroxime, cephalexin, and loracarbef when used for therapy of uncomplicated urinary tract infections due to E. coli, Klebsiella pneumoniae, and Proteus mirabilis.    ANTIMICROBIAL SUSCEPTIBILITY Comment  Final    Comment:       ** S = Susceptible; I = Intermediate; R = Resistant **                    P = Positive; N = Negative             MICS are expressed in micrograms per mL    Antibiotic                 RSLT#1    RSLT#2    RSLT#3    RSLT#4 Amoxicillin/Clavulanic Acid    S Ampicillin                     R Cefepime                       S Ceftriaxone                    S Cefuroxime                     S Cephalothin                    I Ciprofloxacin                  R Ertapenem                      S Gentamicin                     S Imipenem                       S Levofloxacin                   R Nitrofurantoin                 S Piperacillin                   R Tetracycline                   S Tobramycin  S Trimethoprim/Sulfa             R   Urine culture     Status: Abnormal   Collection Time: 11/25/16  5:25 PM  Result Value Ref Range Status   Specimen Description URINE, CLEAN CATCH  Final   Special Requests ADDED 0046 11/26/16  Final   Culture <10,000 COLONIES/mL INSIGNIFICANT GROWTH (A)  Final   Report Status 11/27/2016 FINAL  Final     Labs: Basic Metabolic Panel:  Recent Labs Lab 11/21/16 1146 11/23/16 1638 11/25/16 1655 11/26/16 0022 11/26/16 0507 11/27/16 0821  NA 133* 139  133*  --  136 139  K 4.6 4.2 3.8  --  3.4* 3.7  CL 91* 98 99*  --  102 107  CO2 24 22 23   --  24 26  GLUCOSE 249* 190* 193*  --  154* 149*  BUN 29* 35* 22*  --  20 14  CREATININE 2.77* 2.46* 2.00* 1.96* 1.93* 1.60*  CALCIUM 8.4* 8.2* 8.5*  --  8.3* 8.1*   Liver Function Tests:  Recent Labs Lab 11/25/16 1655  AST 43*  ALT 46  ALKPHOS 59  BILITOT 0.6  PROT 7.6  ALBUMIN 2.5*    Recent Labs Lab 11/25/16 1655  LIPASE 21   No results for input(s): AMMONIA in the last 168 hours. CBC:  Recent Labs Lab 11/25/16 1323 11/25/16 1655 11/26/16 0022 11/26/16 0507 11/27/16 0821  WBC 10.5* 9.7 8.8 7.1 5.9  HGB 13.7* 13.2 12.0* 11.7* 14.3  HCT 38.7* 37.8* 36.3* 35.9* 42.6  MCV 87.7 88.3 89.4 90.2 90.1  PLT  --  214 198 193 148*   Cardiac Enzymes: No results for input(s): CKTOTAL, CKMB, CKMBINDEX, TROPONINI in the last 168 hours. BNP: BNP (last 3 results) No results for input(s): BNP in the last 8760 hours.  ProBNP (last 3 results) No results for input(s): PROBNP in the last 8760 hours.  CBG:  Recent Labs Lab 11/27/16 0801 11/27/16 1113 11/27/16 1708 11/27/16 2155 11/28/16 0749  GLUCAP 129* 160* 132* 143* 111*       Signed:  Rhetta Mura MD   [facility] 11/28/2016, 9:45 AM

## 2016-11-28 NOTE — Progress Notes (Signed)
CM consult received: Please gives a list of primary care physicians who can see him. Pt with health insurance. CM explained and provided pt with [facility] information to help navigate PCP. Gae GallopAngela Micholas Drumwright RN, BSN,CM

## 2016-11-28 NOTE — Care Management Note (Signed)
Case Management Note  Patient Details  Name: Hurshel PartyMark Prater MRN: [id number] Date of Birth: 09-Apr-1966  Subjective/Objective:       Admitted with failed outpatient management of pyelonephritis-issues started 12/23 and presents with persistent fever and chills and back pain. From home with family. Independent with ADL's , no DME usage.              Action/Plan: Plan is to d/c to home today.  Expected Discharge Date:  11/28/16               Expected Discharge Plan:  Home/Self Care  In-House Referral:     Discharge planning Services  CM Consult   Status of Service:  Completed, signed off  If discussed at Long Length of Stay Meetings, dates discussed:    Additional Comments:  Epifanio LeschesCole, Plummer Matich Hudson, RN 11/28/2016, 10:42 AM

## 2016-11-28 NOTE — Progress Notes (Signed)
Hurshel PartyMark Bryngelson to be D/C'd Home per MD order.  Discussed with the patient and all questions fully answered.  VSS, Skin clean, dry and intact without evidence of skin break down, no evidence of skin tears noted. IV catheter discontinued intact. Site without signs and symptoms of complications. Dressing and pressure applied.  An After Visit Summary was printed and given to the patient. Patient received prescription.  D/c education completed with patient/family including follow up instructions, medication list, d/c activities limitations if indicated, with other d/c instructions as indicated by MD - patient able to verbalize understanding, all questions fully answered.   Patient instructed to return to ED, call 911, or call MD for any changes in condition.   Patient escorted via WC, and D/C home via private auto.  Eligah Eastrin M Lexxie Winberg 11/28/2016 1:02 PM

## 2016-11-28 NOTE — Care Management Note (Signed)
Case Management Note  Patient Details  Name: Hurshel PartyMark Bangert MRN: [id number] Date of Birth: [birth year]  Subjective/Objective:          Admitted with pyelonephritis.         Action/Plan: Plan is d/c to home today.  Expected Discharge Date:  11/28/16               Expected Discharge Plan:  Home/Self Care  In-House Referral:     Discharge planning Services  CM Consult  Status of Service:  Completed, signed off  If discussed at Long Length of Stay Meetings, dates discussed:    Additional Comments:  Epifanio LeschesCole, Tracye Szuch Hudson, RN 11/28/2016,

## 2016-12-09 DIAGNOSIS — G4733 Obstructive sleep apnea (adult) (pediatric): Secondary | ICD-10-CM | POA: Diagnosis not present

## 2017-01-09 DIAGNOSIS — G4733 Obstructive sleep apnea (adult) (pediatric): Secondary | ICD-10-CM | POA: Diagnosis not present

## 2017-01-29 ENCOUNTER — Ambulatory Visit: Payer: BLUE CROSS/BLUE SHIELD | Admitting: Neurology

## 2017-01-29 ENCOUNTER — Telehealth: Payer: Self-pay

## 2017-01-29 NOTE — Telephone Encounter (Signed)
I spoke to patient and r/s his appt for 3/28 due to adverse weather.

## 2017-02-06 DIAGNOSIS — G4733 Obstructive sleep apnea (adult) (pediatric): Secondary | ICD-10-CM | POA: Diagnosis not present

## 2017-02-12 ENCOUNTER — Encounter: Payer: Self-pay | Admitting: Adult Health

## 2017-02-14 ENCOUNTER — Encounter: Payer: Self-pay | Admitting: Neurology

## 2017-02-14 ENCOUNTER — Ambulatory Visit (INDEPENDENT_AMBULATORY_CARE_PROVIDER_SITE_OTHER): Payer: BLUE CROSS/BLUE SHIELD | Admitting: Neurology

## 2017-02-14 ENCOUNTER — Encounter (INDEPENDENT_AMBULATORY_CARE_PROVIDER_SITE_OTHER): Payer: Self-pay

## 2017-02-14 VITALS — BP 132/68 | HR 78 | Resp 18 | Ht 74.0 in | Wt 390.0 lb

## 2017-02-14 DIAGNOSIS — G4733 Obstructive sleep apnea (adult) (pediatric): Secondary | ICD-10-CM | POA: Diagnosis not present

## 2017-02-14 DIAGNOSIS — G4761 Periodic limb movement disorder: Secondary | ICD-10-CM | POA: Diagnosis not present

## 2017-02-14 DIAGNOSIS — Z9989 Dependence on other enabling machines and devices: Secondary | ICD-10-CM | POA: Diagnosis not present

## 2017-02-14 NOTE — Patient Instructions (Signed)
Please continue using your CPAP regularly. While your insurance requires that you use CPAP at least 4 hours each night on 70% of the nights, I recommend, that you not skip any nights and use it throughout the night if you can. Getting used to CPAP and staying with the treatment long term does take time and patience and discipline. Untreated obstructive sleep apnea when it is moderate to severe can have an adverse impact on cardiovascular health and raise her risk for heart disease, arrhythmias, hypertension, congestive heart failure, stroke and diabetes. Untreated obstructive sleep apnea causes sleep disruption, nonrestorative sleep, and sleep deprivation. This can have an impact on your day to day functioning and cause daytime sleepiness and impairment of cognitive function, memory loss, mood disturbance, and problems focussing. Using CPAP regularly can improve these symptoms.  Keep up the good work! We can see you in 6 months, you can see one of our nurse practitioners as you are stable. I will see you after that.    

## 2017-02-14 NOTE — Progress Notes (Signed)
Subjective:    Patient ID: Evan Hamilton is a 51 y.o. male.  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 The patient is unaccompanied today. We had to reschedule an appointment for 01/29/2017, due to the weather. I first saw Evan Hamilton on 07/12/2016 at the request of his primary care provider, at which time Evan Hamilton reported snoring and excessive daytime somnolence. I invited Evan Hamilton for sleep study. Evan Hamilton had a baseline sleep study, followed by a CPAP titration study. I went over his test results with Evan Hamilton in detail today. Baseline sleep study from 08/18/2016 showed a sleep latency prolonged at 54.5 minutes, REM latency was normal at 97 minutes, sleep efficiency reduced at 68.3 minutes. Evan Hamilton had an increased percentage of stage III sleep and REM sleep was reduced at 9%. Evan Hamilton had an elevated AHI of 11.8 per hour, rising to 54.9 per hour during REM sleep and supine AHI was 16 per hour. Average oxygen saturation was 96%, nadir was 78%. Time below 89% saturation was 14 minutes. Evan Hamilton had mild PLMS with minimal arousals. Based on his sleep related complaints, and his history I invited Evan Hamilton for a full night CPAP titration study. Evan Hamilton had this on 10/06/2016. Sleep latency was 25 minutes, sleep efficiency was 78.3%, Evan Hamilton had an increased percentage of slow-wave sleep and REM sleep was mildly reduced at 14%, REM latency was mildly prolonged at 124 minutes, Evan Hamilton was fitted with a small full face mask which Evan Hamilton preferred. CPAP was titrated from 5 cm to 14 cm. At a pressure of 12 cm his AHI was 2.6 per hour, O2 nadir 91% with brief supine REM sleep achieved. Based on his test results are prescribed CPAP therapy for home use.  Today, 02/14/2017 (all dictated new, as well as above notes, some dictation done in note  pad or Word, outside of chart, may appear as copied):  I reviewed his CPAP compliance data from 12/26/2016 through 01/24/2017, which is a total of 30 days, during which time Evan Hamilton used his machine every night with percent used days greater than 4 hours at 97%, indicating excellent compliance with an average usage of 6 hours and 37 minutes, residual AHI low at 0.3 per hour, leak low with the 95th percentile at 4.9 L/m at a pressure of 12 cm with EPR of 3. His most recent compliance data from 01/14/2017 through 02/12/2017 shows 100% compliance and an average usage of 6 hours and 27 minutes. Evan Hamilton reports having adjusted well, using a FFM. Has more dreams. Of note, Evan Hamilton was hospitalized recently on 11/25/2016 through 11/28/2016 for pyelonephritis and acute kidney injury. I reviewed the hospital records. Evan Hamilton was given O2 at night during the hospital stay, was not able to tolerate PAP. Doing well now with CPAP. Feels like Evan Hamilton sleeps a little longer and is better rested. Evan Hamilton has not been back to see his PCP after his hospital discharge. His A1c during his hospital stay was 8.0.  Previously (copied from previous notes for reference):   07/12/2016: Evan Hamilton reports snoring and excessive daytime somnolence. I reviewed your office note from 07/07/2016. A1c on 07/07/16 was borderline at 6.7. Evan Hamilton was on metformin in the past and had SEs, including fatigue and sleepiness and felt bad on it.  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  Evan Hamilton has not been treated for depression.  Evan Hamilton lives alone, about a week or 2 ago his sister's family moved in with Evan Hamilton.  Evan Hamilton works for a [employer], smokes some cigarettes, drink beer occasionally, and drinks caffeine occasionally.  Evan Hamilton has no FHx of OSA. Denies RLS symptoms, but was told, that Evan Hamilton snores.  Evan Hamilton is single, has no children. Evan Hamilton goes to bed between 10 PM and 11 PM normally, sometimes as late as midnight. Wake time varies a little  bit. Evan Hamilton does not wake up rested, Epworth sleepiness score is 15 out of 24 today, fatigue score is 51 out of 63. Evan Hamilton has had weight gain in the past year and a half. Evan Hamilton watches TV in bed, sometimes TV stays on all night if Evan Hamilton forgets to put it on a timer.  His Past Medical History Is Significant For: Past Medical History:  Diagnosis Date  . Allergy   . Anxiety and depression   . Diabetes mellitus without complication (HCC)     His Past Surgical History Is Significant For: Past Surgical History:  Procedure Laterality Date  . CHOLECYSTECTOMY N/A 07/22/2014   Procedure: LAPAROSCOPIC CHOLECYSTECTOMY WITH INTRAOPERATIVE CHOLANGIOGRAM;  Surgeon: Harriette Bouillon, MD;  Location: [facility] OR;  Service: General;  Laterality: N/A;    His Family History Is Significant For: Family History  Problem Relation Age of Onset  . Cancer Mother     Lung  . Diabetes Mother   . Heart disease Father   . Hyperlipidemia Father   . Hypertension Father   . Mental illness Sister   . Cancer Maternal Grandmother     Lung  . Diabetes Maternal Grandmother     His Social History Is Significant For: Social History   Social History  . Marital status: Single    Spouse name: N/A  . Number of children: 0  . Years of education: college   Social History Main Topics  . Smoking status: Current Every Day Smoker    Packs/day: 0.25    Years: 20.00    Types: Cigars, Cigarettes  . Smokeless tobacco: Never Used  . Alcohol use 1.5 oz/week    3 Standard drinks or equivalent per week  . Drug use: No  . Sexual activity: No   Other Topics Concern  . None   Social History Narrative   Drinks "some" soda and a "couple" of 5 hour energy drinks a week.     His Allergies Are:  Allergies  Allergen Reactions  . Penicillins Hives and Rash    Tolerates Cephalosporins  :   His Current Medications Are:  Outpatient Encounter Prescriptions as of 02/14/2017  Medication Sig  . loratadine (CLARITIN) 10 MG tablet Take 10 mg by  mouth 2 (two) times daily.  . Multiple Vitamin (MULTIVITAMIN WITH MINERALS) TABS tablet Take 1 tablet by mouth daily.  . [DISCONTINUED] cefUROXime (CEFTIN) 500 MG tablet Take 1 tablet (500 mg total) by mouth 2 (two) times daily with a meal.  . [DISCONTINUED] diphenhydrAMINE (BENADRYL) 25 MG tablet Take 25 mg by mouth every 6 (six) hours as needed for itching or allergies.  . [DISCONTINUED] Ginkgo Biloba Extract (GNP GINGKO BILOBA EXTRACT) 60 MG CAPS Take 60 mg by mouth daily.  . [DISCONTINUED] Potassium Gluconate 595 MG CAPS Take 595 mg by mouth daily.   No facility-administered encounter medications on file as of 02/14/2017.   :  Review of Systems:  Out of a complete 14 point review of systems, all are reviewed and negative with the exception of these symptoms as listed below:  Review of Systems  Neurological:       Patient states that Evan Hamilton is doing well with CPAP. Evan Hamilton feels that the pressure could be turned up a little.     Objective:  Neurologic Exam  Physical Exam Physical Examination:   Vitals:   02/14/17 1421  BP: 132/68  Pulse: 78  Resp: 18    General Examination: The patient is a very pleasant 51 y.o. male in no acute distress. Evan Hamilton appears well-developed and well-nourished and adequately groomed. Has gained weight.  HEENT: Normocephalic, atraumatic, pupils are equal, round and reactive to light and accommodation. Extraocular tracking is good without limitation to gaze excursion or nystagmus noted. Normal smooth pursuit is noted. Hearing is grossly intact. Face is symmetric with normal facial animation and normal facial sensation. Speech is clear with no dysarthria noted. There is no hypophonia. There is no lip, neck/head, jaw or voice tremor. Neck is supple with full range of passive and active motion. There are no carotid bruits on auscultation. Oropharynx exam reveals: mild mouth dryness, adequate dental hygiene and moderate airway crowding. Mallampati is class II. Tongue  protrudes centrally and palate elevates symmetrically. Tonsils are 1+.   Chest: Clear to auscultation without wheezing, rhonchi or crackles noted.  Heart: S1+S2+0, regular and normal without murmurs, rubs or gallops noted.   Abdomen: Soft, non-tender and non-distended with normal bowel sounds appreciated on auscultation.  Extremities: There is trace pitting edema in the distal lower extremities bilaterally. Evan Hamilton is wearing knee-high compression socks.   Skin: Warm and dry without trophic changes noted.  Musculoskeletal: exam reveals no obvious joint deformities, tenderness or joint swelling or erythema.   Neurologically:  Mental status: The patient is awake, alert and oriented in all 4 spheres. His immediate and remote memory, attention, language skills and fund of knowledge are appropriate. There is no evidence of aphasia, agnosia, apraxia or anomia. Speech is clear with normal prosody and enunciation. Thought process is linear. Mood is normal and affect is normal.  Cranial nerves II - XII are as described above under HEENT exam. In addition: shoulder shrug is normal with equal shoulder height noted. Motor exam: Normal bulk, strength and tone is noted. There is no drift, tremor or rebound. Romberg is negative. Reflexes are 1+ in the UEs and trace in the LEs. Fine motor skills and coordination: intact.  Cerebellar testing: No dysmetria or intention tremor. There is no truncal or gait ataxia.  Sensory exam: intact to light touch.  Gait, station and balance: Evan Hamilton stands easily. No veering to one side is noted. No leaning to one side is noted. Posture is age-appropriate and stance is narrow based. Gait shows normal stride length and normal pace. No problems turning are noted.               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 Evan Hamilton had a baseline sleep study in September 2017 which showed mild obstructive sleep apnea overall, moderate during supine sleep and severe during REM sleep. His O2 nadir was 78%. Evan Hamilton then had a CPAP titration study in November 2017 and did well with  CPAP of 12 cm. Evan Hamilton has been compliant with CPAP therapy and is indicating good results, achieving more consolidation of sleep, but a restful sleep and less daytime somnolence, more dream sleep. Evan Hamilton is commended for his treatment adherence but is advised to try to make more time for sleep. Furthermore, Evan Hamilton is encouraged to go back to his primary care physician for recheck of his kidney function and diabetes management. During his recent hospitalization in January 2018 Evan Hamilton had acute kidney injury with improved kidney function during the hospital stay, nevertheless Evan Hamilton needs a recheck on this. Furthermore, Evan Hamilton is advised to work on weight loss, Evan Hamilton has gained a little bit of weight. Neurologically, Evan Hamilton is stable. His A1c was 8.0 in the Evan Hamilton is currently not on any diabetes medications. Evan Hamilton needs management of this as well. From the sleep apnea standpoint, I suggested a 6 month follow-up, Evan Hamilton can see one of our nurse practitioners at the time. I answered all his questions today and Evan Hamilton was in agreement.  I spent 25 minutes in total face-to-face time with the patient, more than 50% of which was spent in counseling and coordination of care, reviewing test results, reviewing medication and discussing or reviewing the diagnosis of OSA, its prognosis and treatment options. Pertinent laboratory and imaging test results that were available during this visit with the patient were reviewed by me and considered in my medical decision making (see chart for details).

## 2017-03-02 DIAGNOSIS — E113299 Type 2 diabetes mellitus with mild nonproliferative diabetic retinopathy without macular edema, unspecified eye: Secondary | ICD-10-CM | POA: Diagnosis not present

## 2017-08-20 ENCOUNTER — Encounter: Payer: Self-pay | Admitting: Neurology

## 2017-08-21 ENCOUNTER — Encounter: Payer: Self-pay | Admitting: Adult Health

## 2017-08-21 ENCOUNTER — Ambulatory Visit (INDEPENDENT_AMBULATORY_CARE_PROVIDER_SITE_OTHER): Payer: BLUE CROSS/BLUE SHIELD | Admitting: Adult Health

## 2017-08-21 VITALS — BP 133/81 | HR 63 | Ht 74.0 in | Wt 384.0 lb

## 2017-08-21 DIAGNOSIS — G4733 Obstructive sleep apnea (adult) (pediatric): Secondary | ICD-10-CM | POA: Diagnosis not present

## 2017-08-21 DIAGNOSIS — Z9989 Dependence on other enabling machines and devices: Secondary | ICD-10-CM | POA: Diagnosis not present

## 2017-08-21 NOTE — Progress Notes (Addendum)
PATIENT: Evan Hamilton DOB: 1966-04-18  REASON FOR VISIT: follow up HISTORY FROM: patient  HISTORY OF PRESENT ILLNESS: Today 08/21/17: Evan Hamilton is a 51 year old male with a history of obstructive sleep apnea on CPAP. He returns today for a compliance download. His download indicates that he used his machine 24 and 30 days for compliance of 80%. He uses machine greater than 4 hours 22 out of 30 days for compliance of 73%. On average he uses machine 5 hours and 39 minutes. His residual AHI is 0.2  on12 cm of water with EPR of 3. He does not have a significant leak. He states that he is tolerating the machine well. He continues to notice the benefit. He denies any new neurological symptoms. He returns today for an evaluation.  HISTORY: Copied from Dr. 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 The patient is unaccompanied today. We had to reschedule an appointment for 01/29/2017, due to the weather. I first saw him on 07/12/2016 at the request of his primary care provider, at which time he reported snoring and excessive daytime somnolence. I invited him for sleep study. He had a baseline sleep study, followed by a CPAP titration study. I went over his test results with him in detail today. Baseline sleep study from 08/18/2016 showed a sleep latency prolonged at 54.5 minutes, REM latency was normal at 97 minutes, sleep efficiency reduced at 68.3 minutes. He had an increased percentage of stage III sleep and REM sleep was reduced at 9%. He had an elevated AHI of 11.8 per hour, rising to 54.9 per hour during REM sleep and supine AHI was 16 per hour. Average oxygen saturation was 96%, nadir was 78%. Time below 89% saturation was 14 minutes. He had mild PLMS with minimal  arousals. Based on his sleep related complaints, and his history I invited him for a full night CPAP titration study. He had this on 10/06/2016. Sleep latency was 25 minutes, sleep efficiency was 78.3%, he had an increased percentage of slow-wave sleep and REM sleep was mildly reduced at 14%, REM latency was mildly prolonged at 124 minutes, he was fitted with a small full face mask which he preferred. CPAP was titrated from 5 cm to 14 cm. At a pressure of 12 cm his AHI was 2.6 per hour, O2 nadir 91% with brief supine REM sleep achieved. Based on his test results are prescribed CPAP therapy for home use.   02/14/2017  I reviewed his CPAP compliance data from 12/26/2016 through 01/24/2017, which is a total of 30 days, during which time he used his machine every night with percent used days greater than 4 hours at 97%, indicating excellent compliance with an average usage of 6 hours and 37 minutes, residual AHI low at 0.3 per hour, leak low with the 95th percentile at 4.9 L/m at a pressure of 12 cm with EPR of 3. His most recent compliance data from 01/14/2017 through 02/12/2017 shows 100% compliance and an average usage of 6 hours and 27 minutes. He reports having adjusted well, using a FFM. Has more dreams. Of note, he was hospitalized recently on 11/25/2016 through 11/28/2016 for pyelonephritis and acute kidney injury. I reviewed the hospital records. He was given O2 at night during the hospital stay, was not able to  tolerate PAP. Doing well now with CPAP. Feels like he sleeps a little longer and is better rested. He has not been back to see his PCP after his hospital discharge. His A1c during his hospital stay was 8.0.   REVIEW OF SYSTEMS: Out of a complete 14 system review of symptoms, the patient complains only of the following symptoms, and all other reviewed systems are negative.  See history of present illness  ALLERGIES: Allergies  Allergen Reactions  . Penicillins Hives and Rash    Tolerates  Cephalosporins    HOME MEDICATIONS: Outpatient Medications Prior to Visit  Medication Sig Dispense Refill  . loratadine (CLARITIN) 10 MG tablet Take 10 mg by mouth 2 (two) times daily.    . Multiple Vitamin (MULTIVITAMIN WITH MINERALS) TABS tablet Take 1 tablet by mouth daily.     No facility-administered medications prior to visit.     PAST MEDICAL HISTORY: Past Medical History:  Diagnosis Date  . Allergy   . Anxiety and depression   . Diabetes mellitus without complication (HCC)     PAST SURGICAL HISTORY: Past Surgical History:  Procedure Laterality Date  . CHOLECYSTECTOMY N/A 07/22/2014   Procedure: LAPAROSCOPIC CHOLECYSTECTOMY WITH INTRAOPERATIVE CHOLANGIOGRAM;  Surgeon: Harriette Bouillon, MD;  Location: [facility] OR;  Service: General;  Laterality: N/A;    FAMILY HISTORY: Family History  Problem Relation Age of Onset  . Cancer Mother        Lung  . Diabetes Mother   . Heart disease Father   . Hyperlipidemia Father   . Hypertension Father   . Mental illness Sister   . Cancer Maternal Grandmother        Lung  . Diabetes Maternal Grandmother     SOCIAL HISTORY: Social History   Social History  . Marital status: Single    Spouse name: N/A  . Number of children: 0  . Years of education: college   Occupational History  . Not on file.   Social History Main Topics  . Smoking status: Current Every Day Smoker    Packs/day: 0.25    Years: 20.00    Types: Cigars, Cigarettes  . Smokeless tobacco: Never Used  . Alcohol use 1.5 oz/week    3 Standard drinks or equivalent per week  . Drug use: No  . Sexual activity: No   Other Topics Concern  . Not on file   Social History Narrative   Drinks "some" soda and a "couple" of 5 hour energy drinks a week.       PHYSICAL EXAM  There were no vitals filed for this visit. There is no height or weight on file to calculate BMI.  Generalized: Well developed, in no acute distress   Neurological examination  Mentation:  Alert oriented to time, place, history taking. Follows all commands speech and language fluent Cranial nerve II-XII: Pupils were equal round reactive to light. Extraocular movements were full, visual field were full on confrontational test. Facial sensation and strength were normal. Uvula tongue midline. Head turning and shoulder shrug  were normal and symmetric. Motor: The motor testing reveals 5 over 5 strength of all 4 extremities. Good symmetric motor tone is noted throughout.  Sensory: Sensory testing is intact to soft touch on all 4 extremities. No evidence of extinction is noted.  Coordination: Cerebellar testing reveals good finger-nose-finger and heel-to-shin bilaterally.  Gait and station: Gait is normal. Tandem gait is normal. Romberg is negative. No drift is seen.  Reflexes: Deep tendon reflexes are symmetric and  normal bilaterally.   DIAGNOSTIC DATA (LABS, IMAGING, TESTING) - I reviewed patient records, labs, notes, testing and imaging myself where available.  Lab Results  Component Value Date   WBC 5.9 11/27/2016   HGB 14.3 11/27/2016   HCT 42.6 11/27/2016   MCV 90.1 11/27/2016   PLT 148 (L) 11/27/2016      Component Value Date/Time   NA 139 11/27/2016 0821   NA 139 11/23/2016 1638   K 3.7 11/27/2016 0821   CL 107 11/27/2016 0821   CO2 26 11/27/2016 0821   GLUCOSE 149 (H) 11/27/2016 0821   BUN 14 11/27/2016 0821   BUN 35 (H) 11/23/2016 1638   CREATININE 1.60 (H) 11/27/2016 0821   CREATININE 0.93 07/07/2016 1639   CALCIUM 8.1 (L) 11/27/2016 0821   PROT 7.6 11/25/2016 1655   ALBUMIN 2.5 (L) 11/25/2016 1655   AST 43 (H) 11/25/2016 1655   ALT 46 11/25/2016 1655   ALKPHOS 59 11/25/2016 1655   BILITOT 0.6 11/25/2016 1655   GFRNONAA 49 (L) 11/27/2016 0821   GFRNONAA >89 07/07/2016 1639   GFRAA 57 (L) 11/27/2016 0821   GFRAA >89 07/07/2016 1639    Lab Results  Component Value Date   HGBA1C 8.0 (H) 11/26/2016    Lab Results  Component Value Date   TSH 2.46  07/07/2016      ASSESSMENT AND PLAN 51 y.o. year old male  has a past medical history of Allergy; Anxiety and depression; and Diabetes mellitus without complication (HCC). here with:  1. OSA on CPAP  Patient's CPAP download shows excellent compliance and good treatment of sleep apnea. He is encouraged to continue using the CPAP nightly. He is advised that if symptoms worsen or he develops new symptoms he should let us know. He will follow-up in one year or sooner if needed.  I spent 15 minutes with the patient. 50% of this time was spent reviewing his CPAP download      Butch Penny, MSN, NP-C 08/21/2017, 3:04 PM [facility] [address], [address] [place], [place] [address] [phone]  I reviewed the above note and documentation by the Nurse Practitioner and agree with the history, physical exam, assessment and plan as outlined above. I was immediately available for face-to-face consultation. Huston Foley, MD, PhD [facility])

## 2017-08-21 NOTE — Patient Instructions (Addendum)
Your Plan:  Continue using CPAP nightly. Try to use > 4 hours each night If your symptoms worsen or you develop new symptoms please let us know.   Thank you for coming to see us at [facility]. I hope we have been able to provide you high quality care today.  You may receive a patient satisfaction survey over the next few weeks. We would appreciate your feedback and comments so that we may continue to improve ourselves and the health of our patients.  

## 2017-09-22 IMAGING — DX DG TOE 3RD 2+V*L*
3 series · 3 of 3 positions shown · non-contrast
Comparison: None.

CLINICAL DATA: Injury third toe

EXAM:
LEFT FOOT WITH PARTICULAR ATTENTION TO THIRD DIGIT:  3 V

[toe ap]
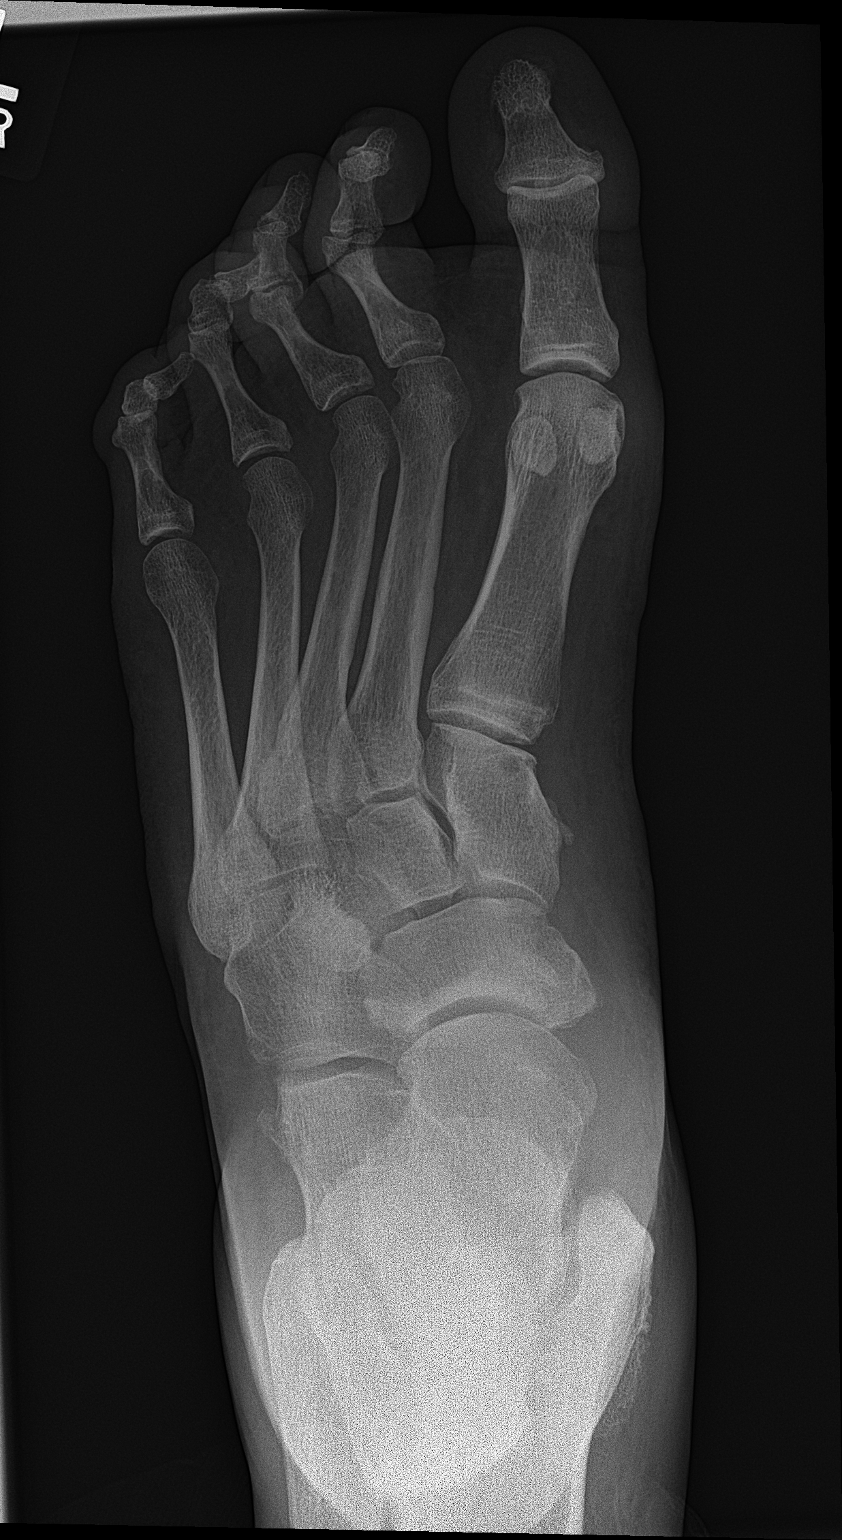

[toe obl]
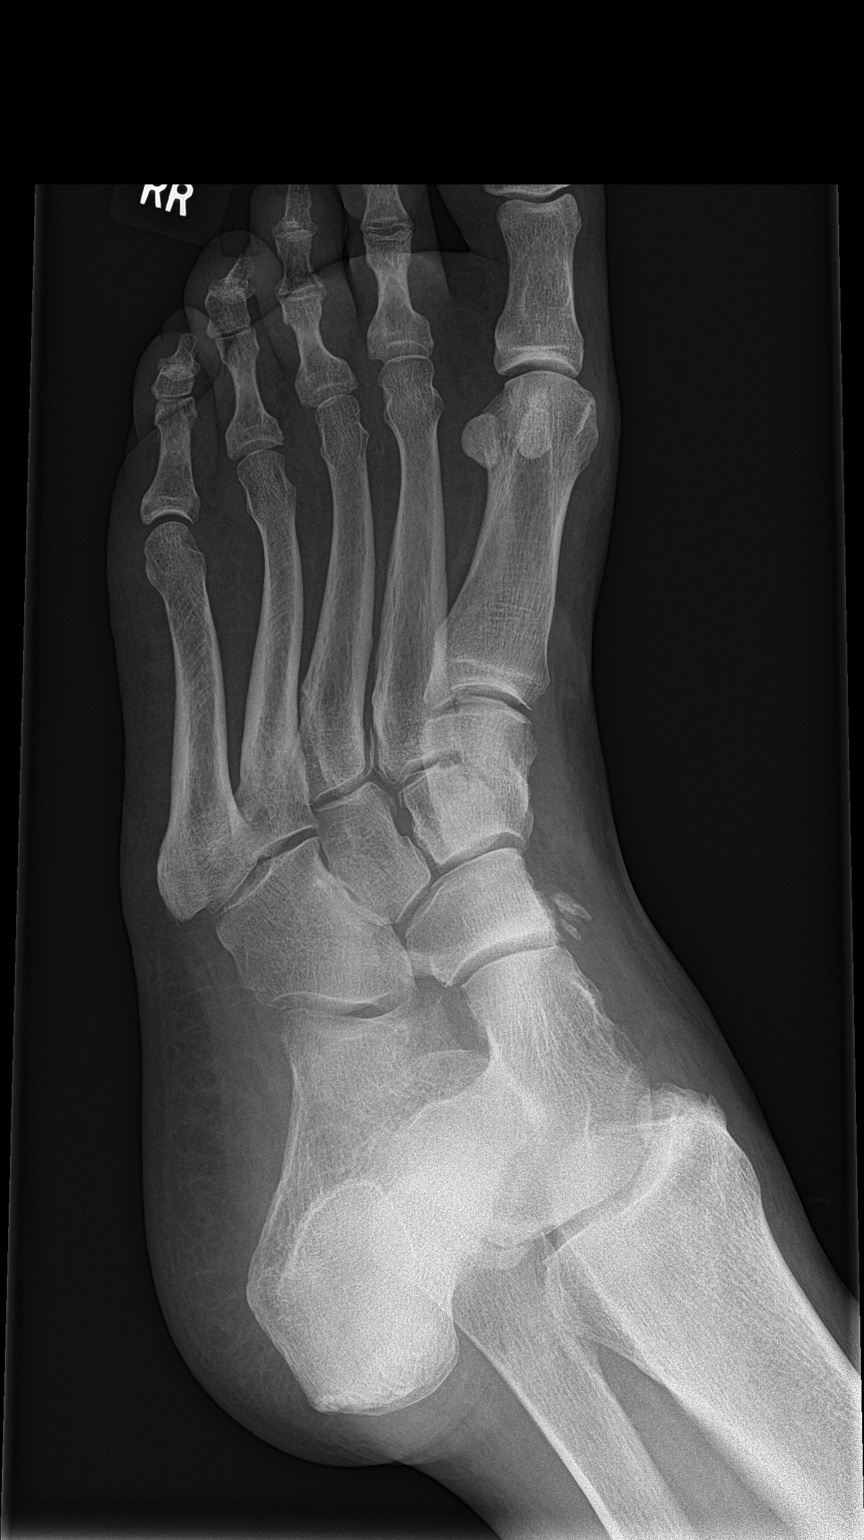

[toe lat]
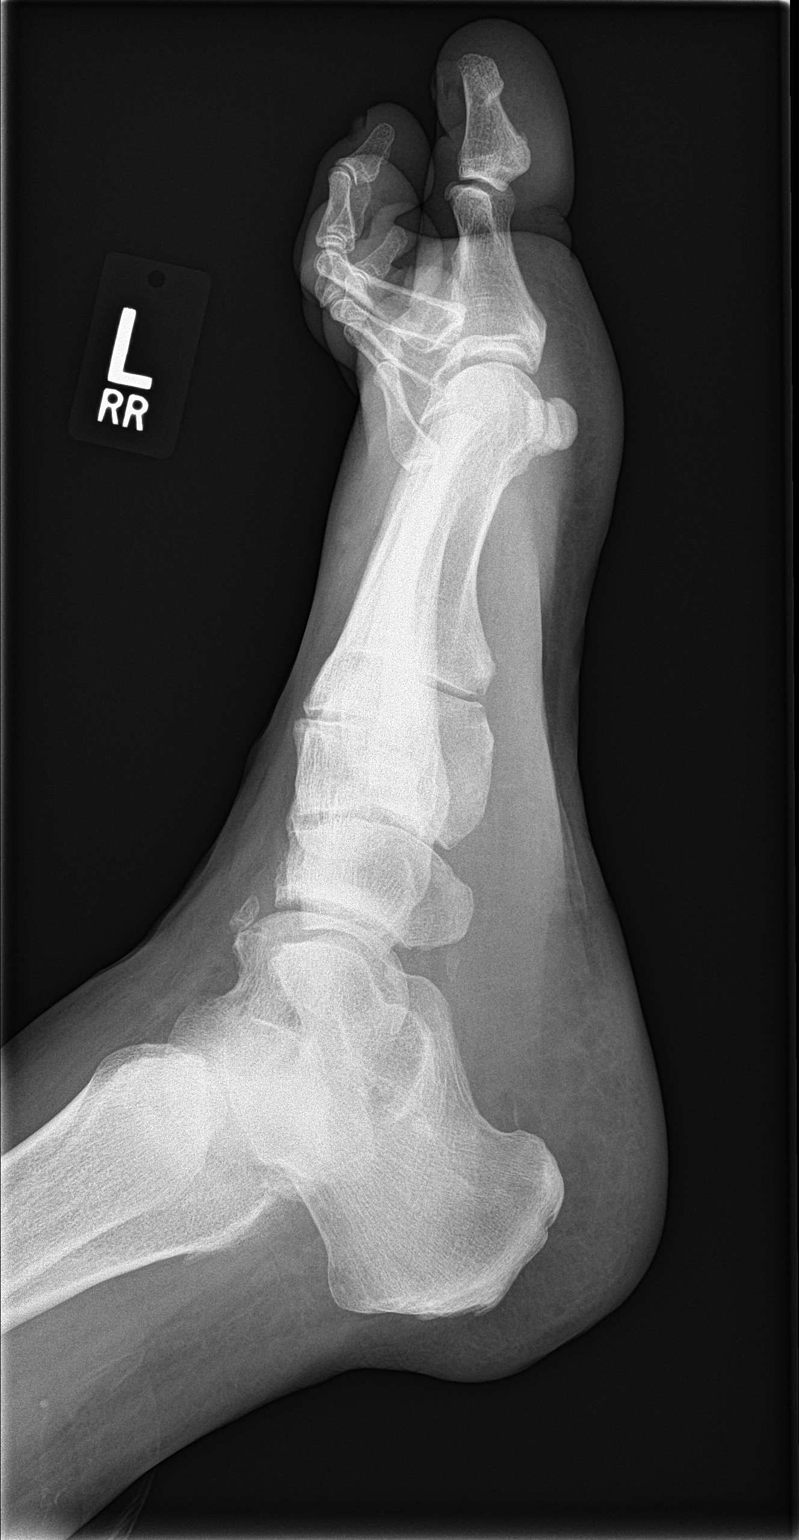

[3 of 3 positions shown; findings below may reference images not displayed]

FINDINGS: Frontal, oblique, and lateral views were obtained. There are flexion
deformities of the second, third, fourth, and fifth MTP and PIP
joints. There is no acute fracture or dislocation. There is no
appreciable joint space narrowing or erosion. No bony destruction.
There are accessory ossicles medial to the medial cuneiform bone.
There is calcification dorsal and anterior to the talus, possibly
residua of old trauma.
IMPRESSION: No acute fracture or dislocation. Flexion deformities of the second
and third, fourth, and fifth MTP and PIP joints. No erosive change
or bony destruction. No appreciable joint space narrowing.

## 2017-10-07 IMAGING — DX DG ABDOMEN 1V
2 series · 3 of 3 positions shown · non-contrast
Comparison: Ultrasound 07/10/2014.  CT 07/04/2011 report.

CLINICAL DATA: Abdominal pain.

EXAM:
ABDOMEN - 1 VIEW

[Series 1: abdomen kub · 0.14mm/px · 2 of 2 slices shown (1 of 2)]
[im 1/2]
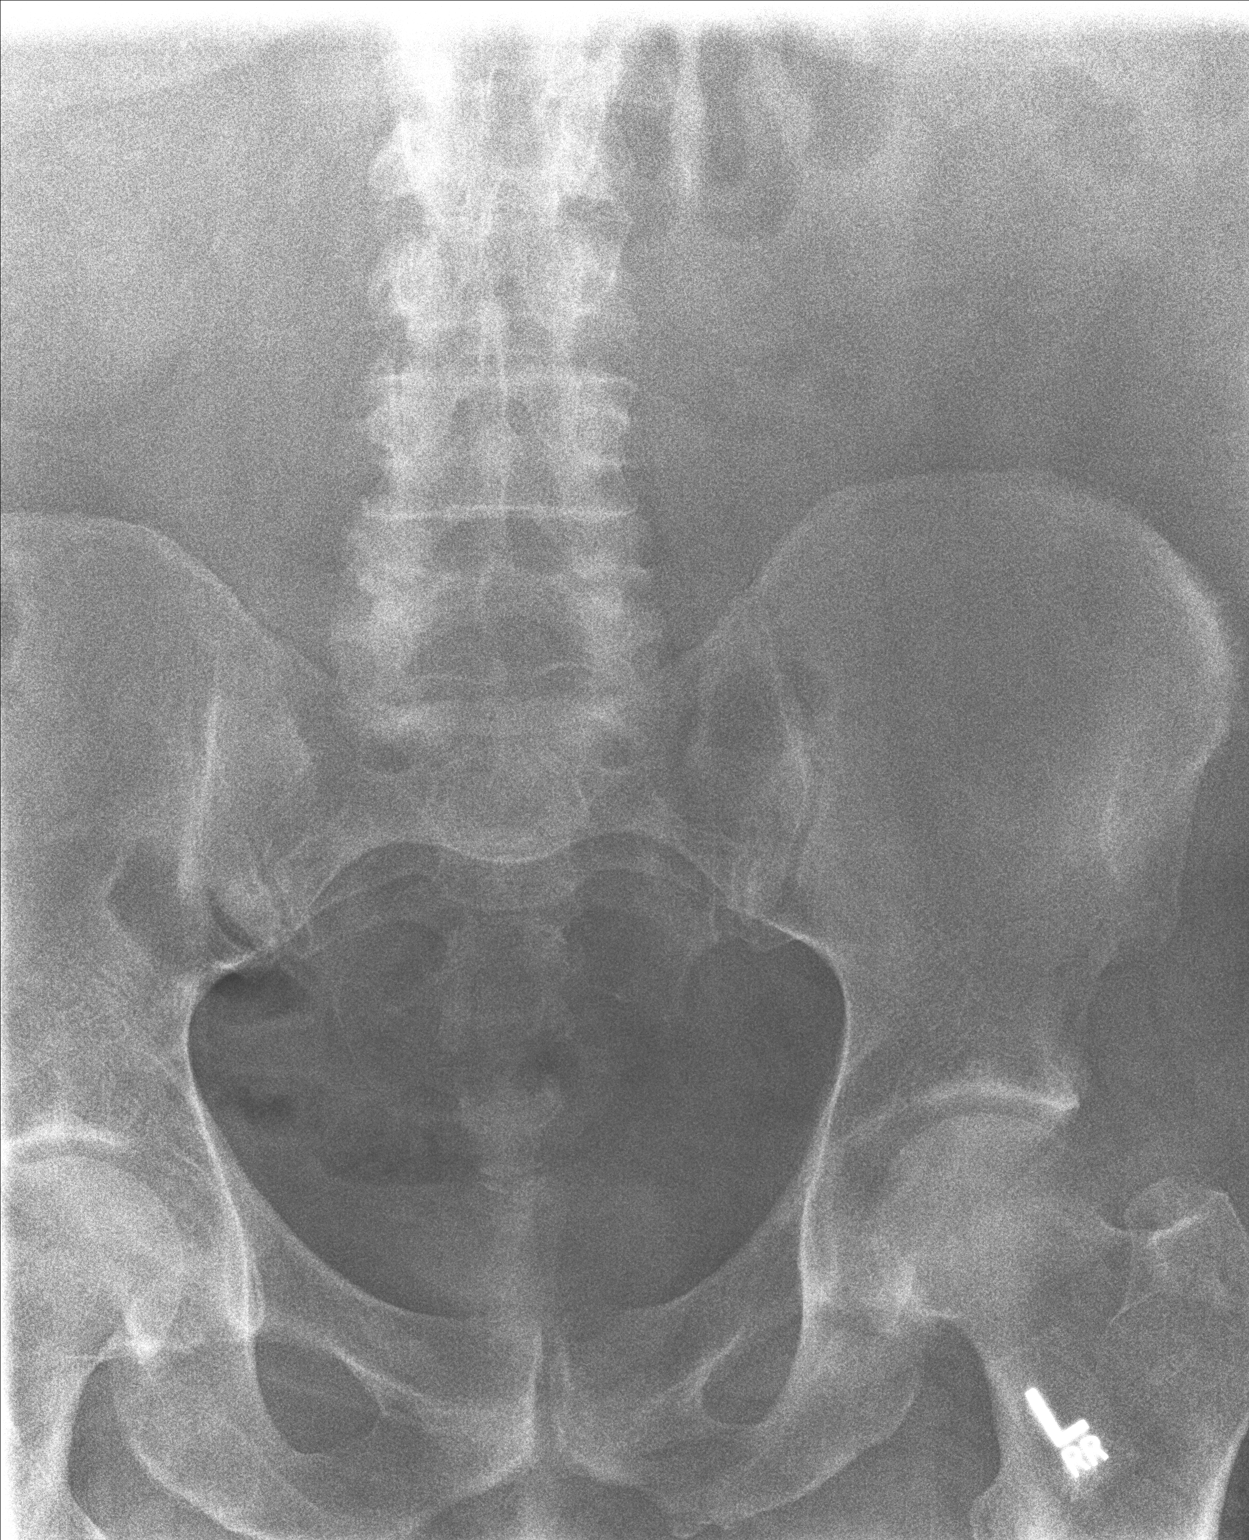
[im 2/2]
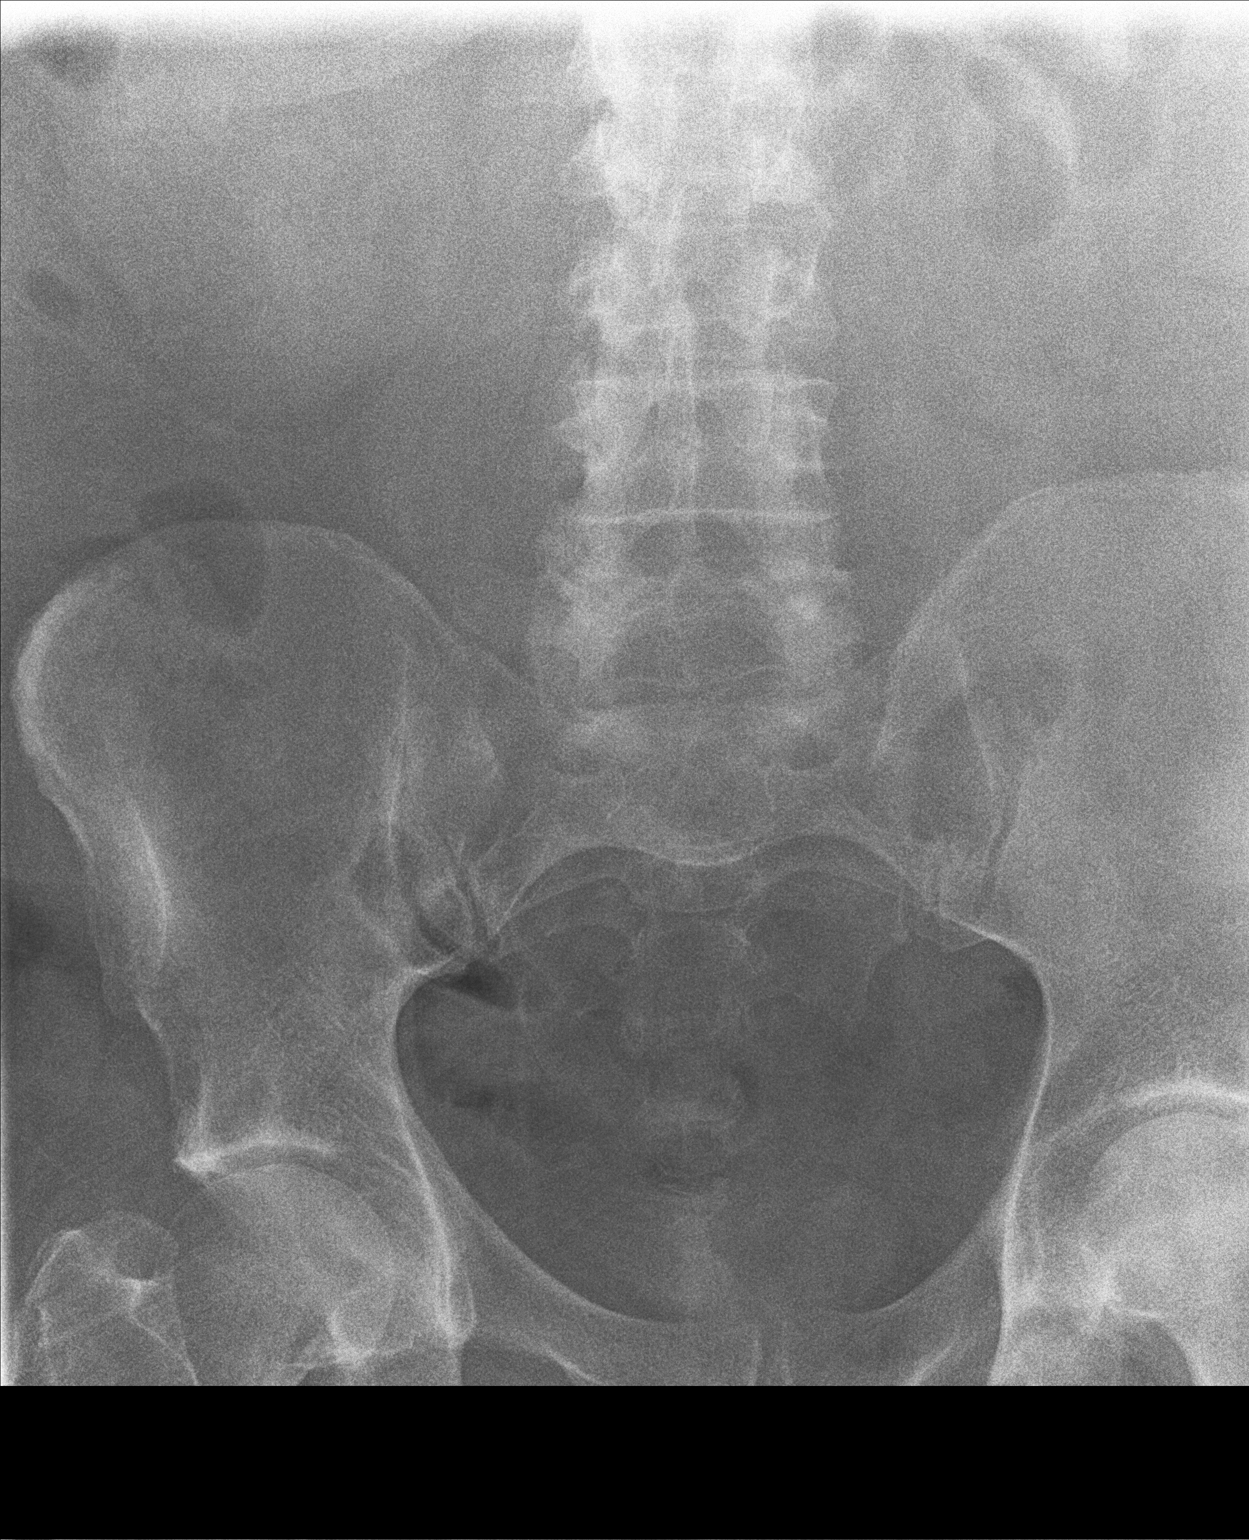

[abdomen kub (2 of 2)]
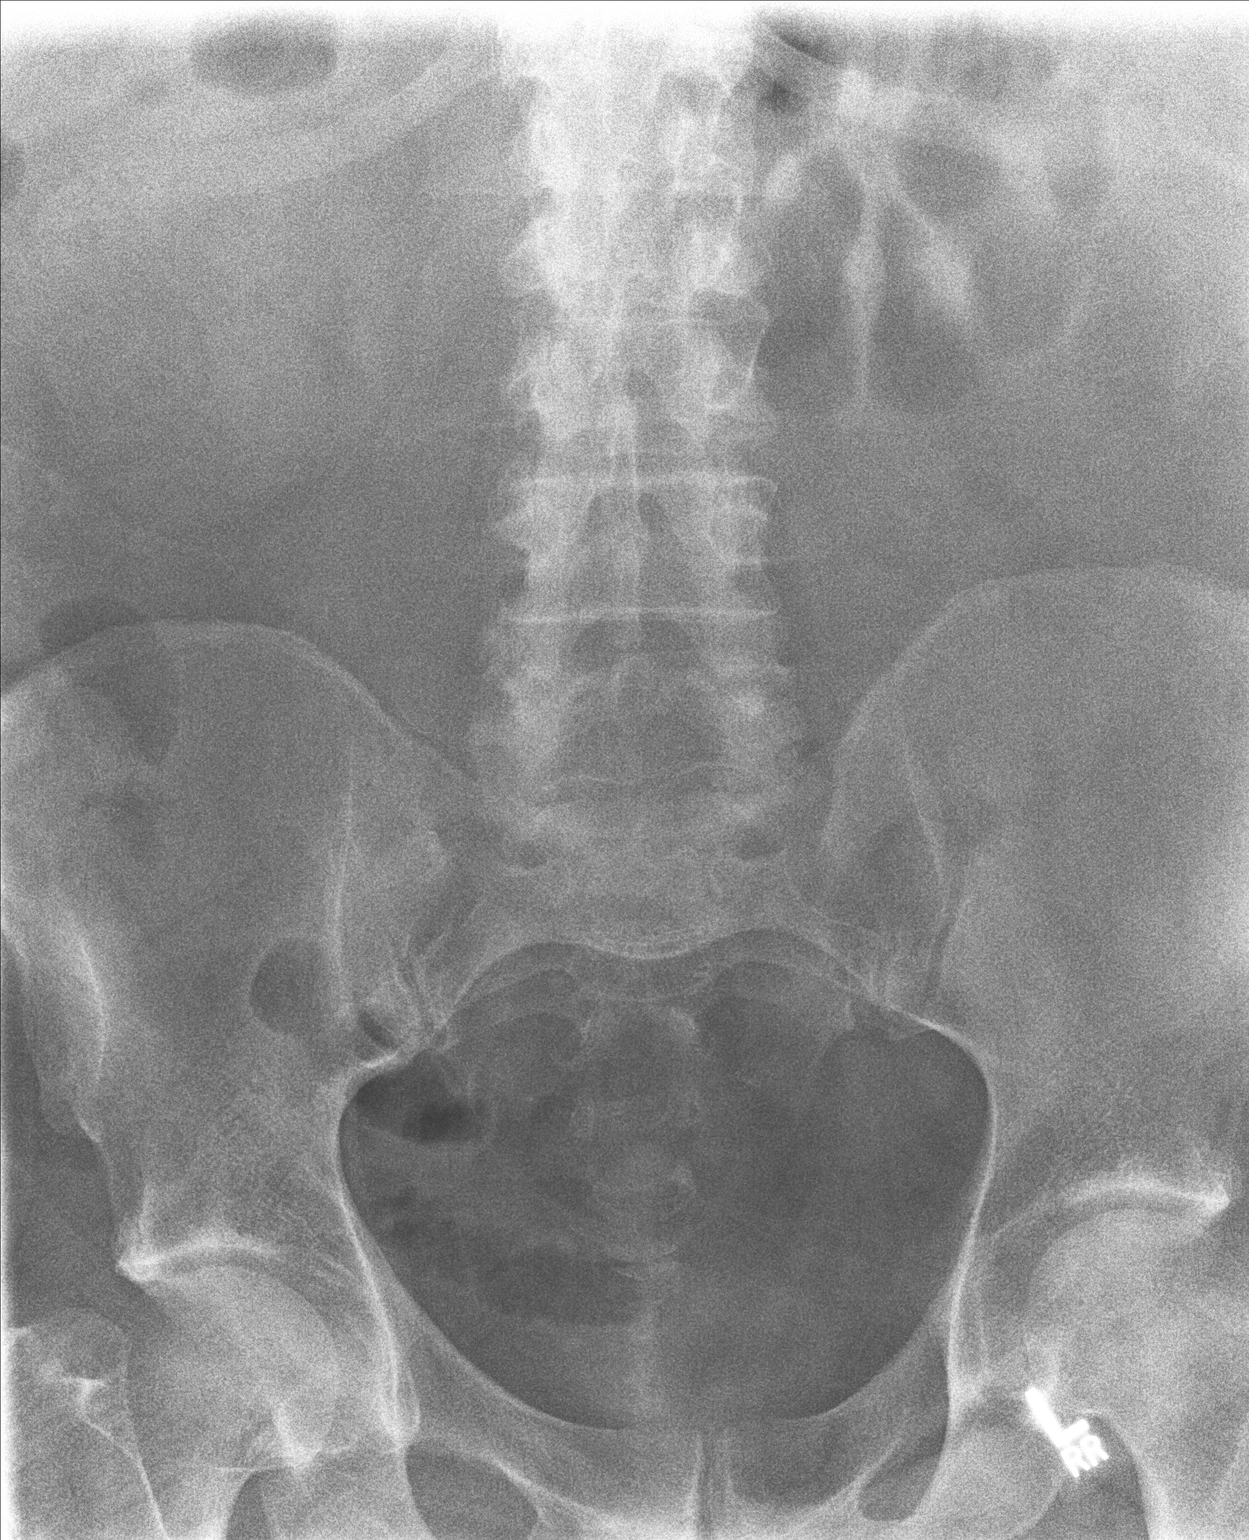

[3 of 3 positions shown; findings below may reference images not displayed]

FINDINGS: Exam limited by technique. Soft tissue structures are unremarkable.
No bowel distention. Degenerative changes lumbar spine and both
hips.
IMPRESSION: No acute abnormality.

## 2017-10-10 IMAGING — CT CT ABD-PELV W/O CM
2 of 4 series · 16 of 46 positions shown, 18 images · non-contrast
Comparison: 07/04/2011.

CLINICAL DATA: 50-year-old with acute onset of right lower quadrant
abdominal pain. Intravenous contrast not administered due to an
elevated creatinine of 2.00.

EXAM:
CT ABDOMEN AND PELVIS WITHOUT CONTRAST
TECHNIQUE: Multidetector CT imaging of the abdomen and pelvis was performed
following the standard protocol without IV contrast.

[Series 2: a/p w/o 5mm · axial · non-contrast · 0.98mm/px · z∈[+318,+794]mm · 13 of 105 slices shown, 15 images]
[im 5/105  soft-tissue]
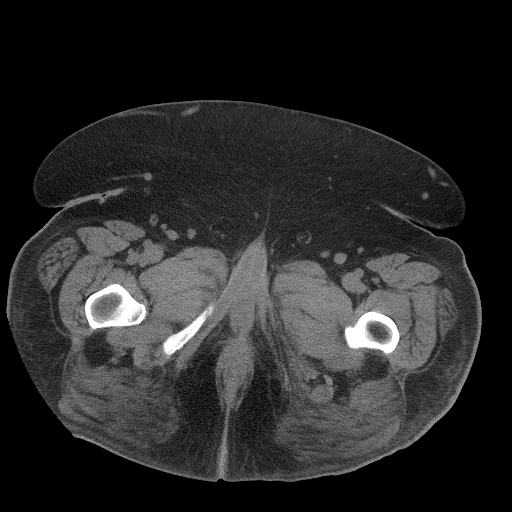
[im 5/105  bone]
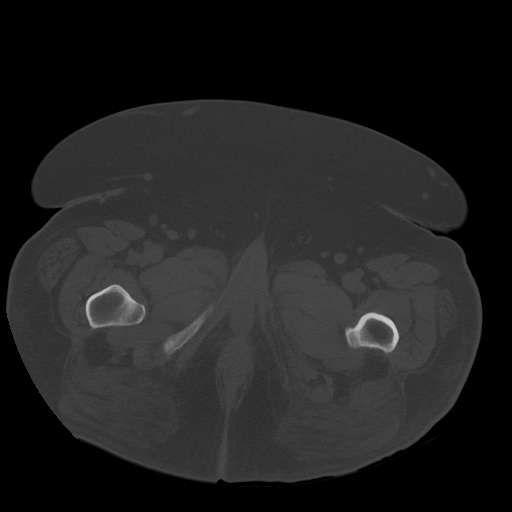
[im 15/105  soft-tissue]
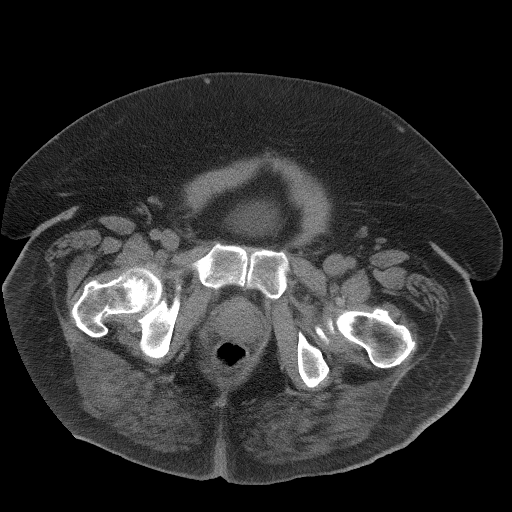
[im 20/105  soft-tissue]
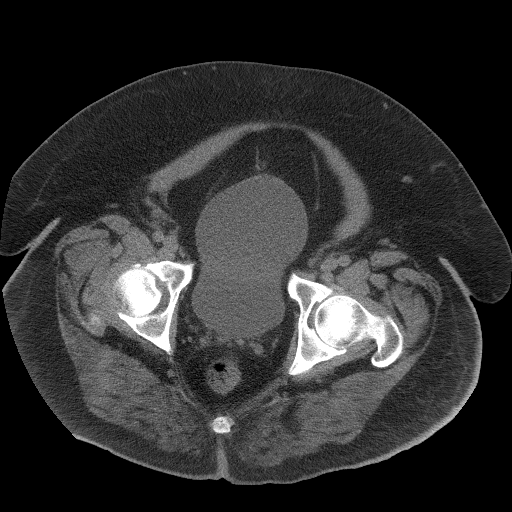
[im 30/105  soft-tissue]
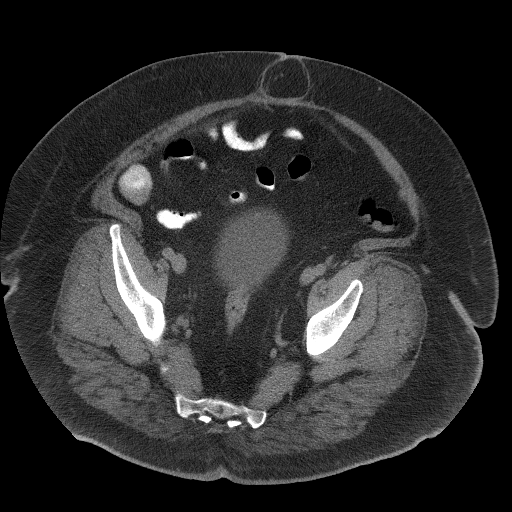
[im 35/105  soft-tissue]
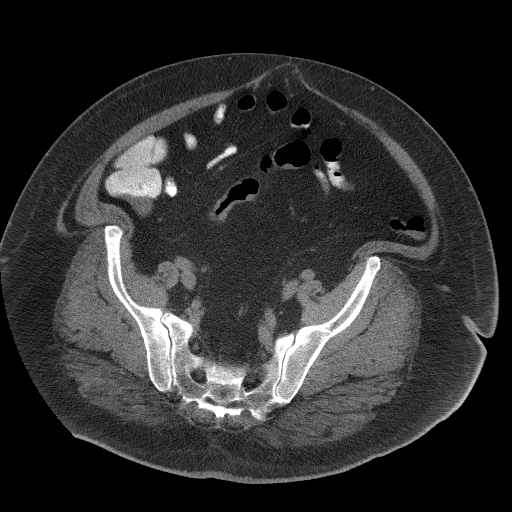
[im 45/105  soft-tissue]
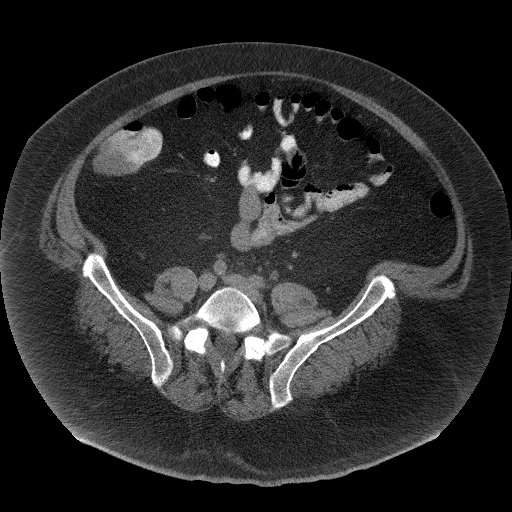
[im 55/105  soft-tissue]
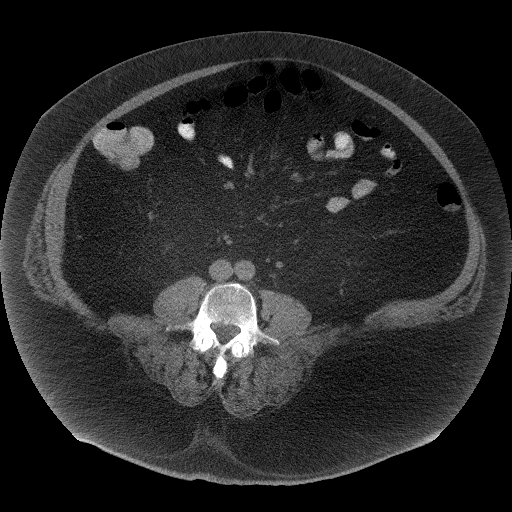
[im 60/105  soft-tissue]
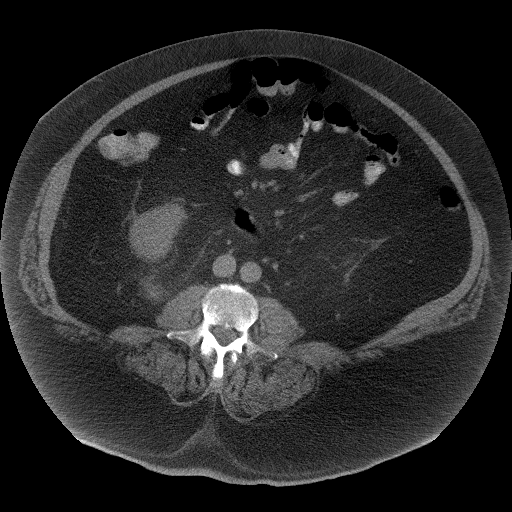
[im 70/105  soft-tissue]
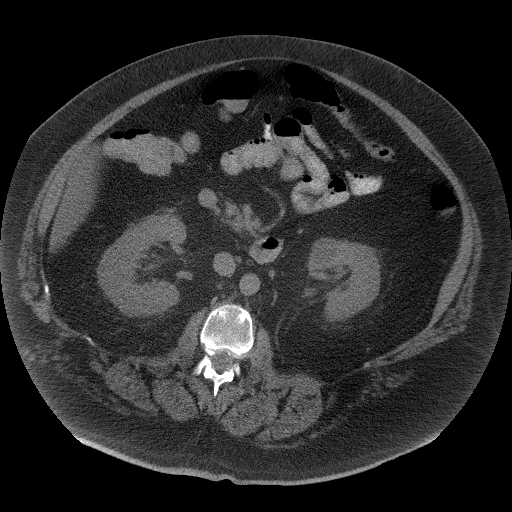
[im 70/105  bone]
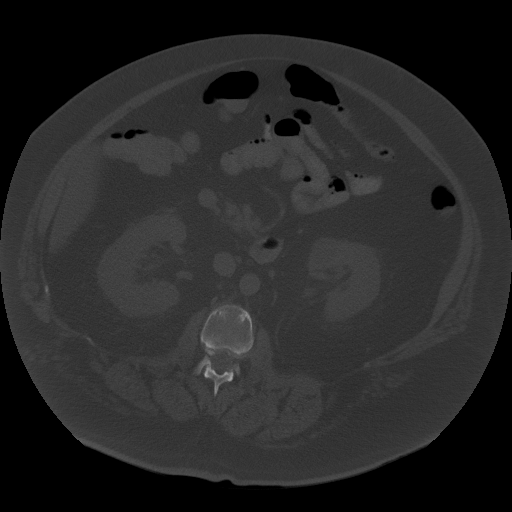
[im 75/105  soft-tissue]
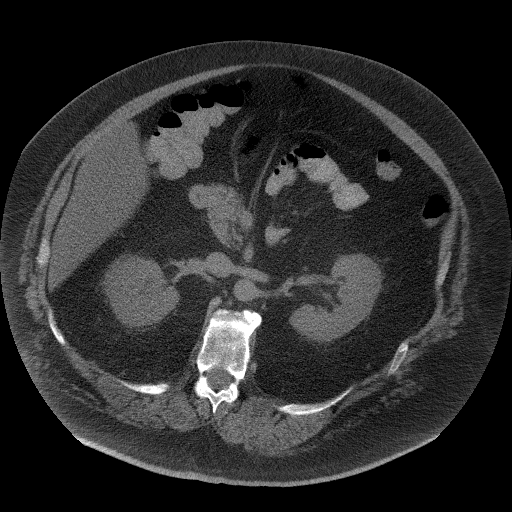
[im 85/105  soft-tissue]
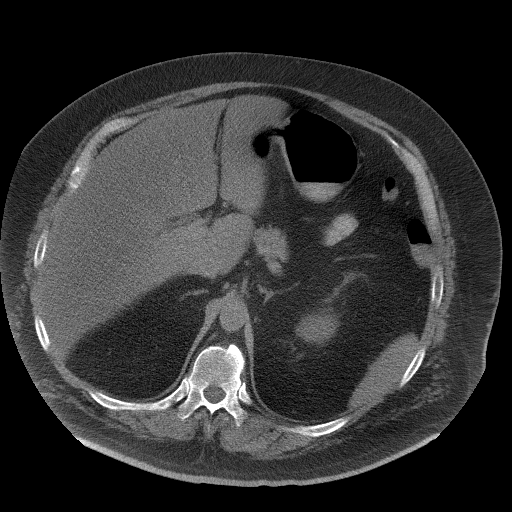
[im 90/105  soft-tissue]
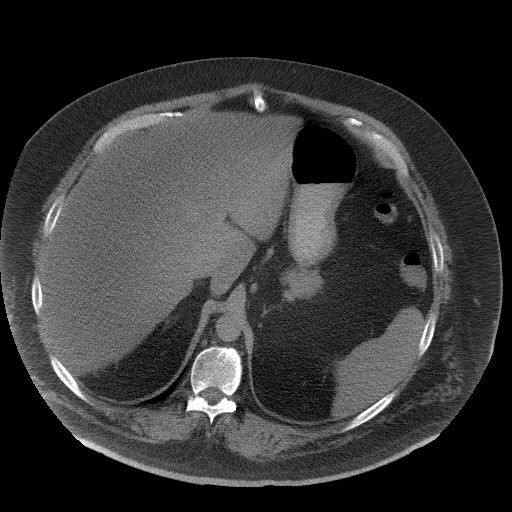
[im 100/105  soft-tissue]
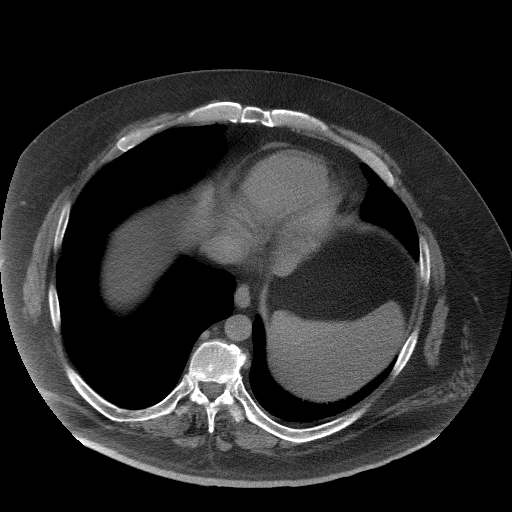

[Series 5: a/p w/o cor · coronal · non-contrast · 0.99mm/px · 3 of 198 slices shown]
[im 66/198  soft-tissue]
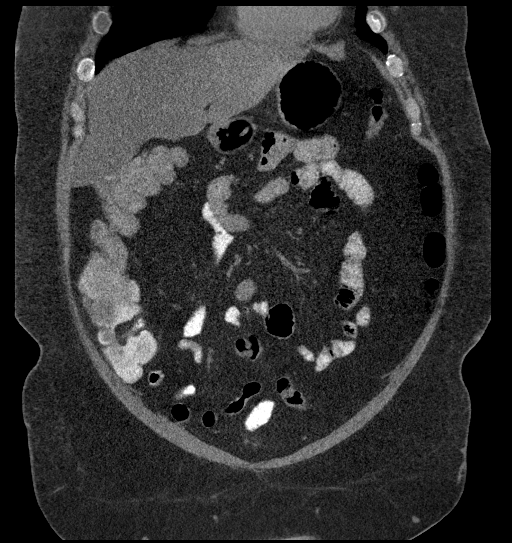
[im 88/198  soft-tissue]
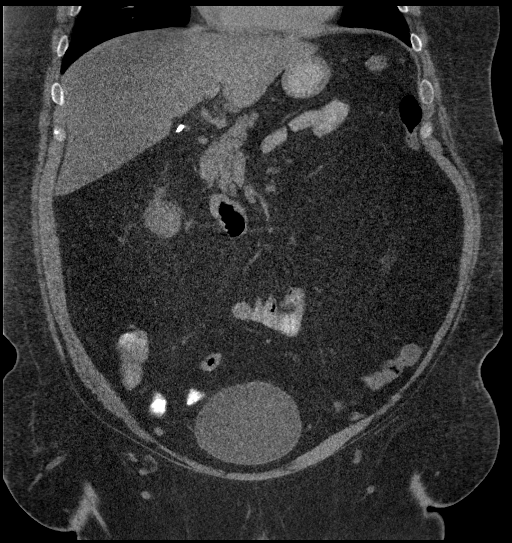
[im 110/198  soft-tissue]
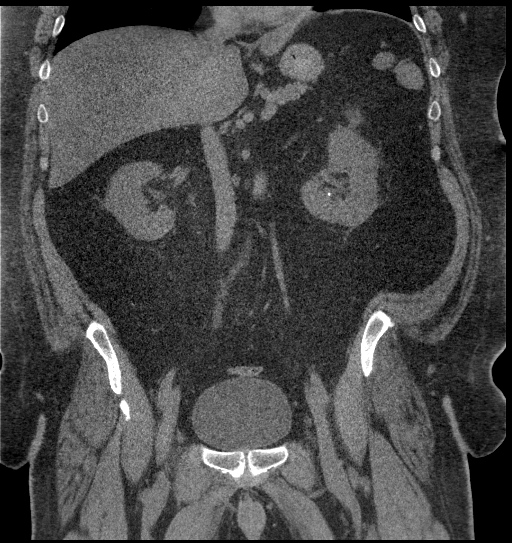

[16 of 46 positions shown; findings below may reference images not displayed]

FINDINGS: Lower chest: Heart size normal. Visualized lung bases clear. Chronic
elevation of the left hemidiaphragm, unchanged.

Hepatobiliary: Hepatic steatosis with focal areas of sparing.
Surgically absent gallbladder. No biliary ductal dilation.

Pancreas: Normal in appearance without evidence of mass, ductal
dilation, or inflammation.

Spleen: Normal in size and appearance.

Adrenals/Urinary Tract: Normal appearing adrenal glands. Exophytic
cyst arising from the lower pole of the right kidney measuring
approximately 3 cm, increased in size since the prior CT.
Approximate 3.5 cm simple cyst arising from the lower pole of the
left kidney, increased in size since the prior CT. Within the limits
of the unenhanced technique, no solid renal masses. No
hydronephrosis. Nonobstructing very small (1-2 mm) calculus in a
lower pole calyx of the left kidney. Urinary bladder mildly
distended and unremarkable.

Stomach/Bowel: Stomach normal in appearance for the degree of
distention. Normal-appearing small bowel. Normal-appearing colon
with expected stool burden. Entire colon relatively decompressed.
Normal-appearing decompressed appendix in the right upper pelvis.

Vascular/Lymphatic: Minimal atherosclerosis involving the abdominal
aorta. No pathologic lymphadenopathy.

Reproductive: Prostate gland and seminal vesicles normal in
appearance.

Other: Umbilical hernia containing fat.

Musculoskeletal: Remote compression fracture of T12. DISH involving
the lower lumbar spine. No acute abnormalities.
IMPRESSION: 1. No acute abnormalities involving the abdomen or pelvis.
2. Nonobstructing 1-2 mm calculus in a lower pole calyx of the left
kidney.
3. Simple cysts involving the lower poles of both kidneys.
4. Diffuse hepatic steatosis with scattered areas of focal sparing.
5. Umbilical hernia containing fat.

## 2018-08-21 ENCOUNTER — Ambulatory Visit: Payer: BLUE CROSS/BLUE SHIELD | Admitting: Neurology

## 2019-11-10 DIAGNOSIS — E113299 Type 2 diabetes mellitus with mild nonproliferative diabetic retinopathy without macular edema, unspecified eye: Secondary | ICD-10-CM | POA: Diagnosis not present

## 2021-05-13 ENCOUNTER — Other Ambulatory Visit: Payer: Self-pay | Admitting: Podiatry

## 2021-05-13 DIAGNOSIS — M86171 Other acute osteomyelitis, right ankle and foot: Secondary | ICD-10-CM

## 2021-05-16 ENCOUNTER — Other Ambulatory Visit: Payer: Self-pay | Admitting: Podiatry

## 2021-05-16 DIAGNOSIS — M86172 Other acute osteomyelitis, left ankle and foot: Secondary | ICD-10-CM

## 2021-05-21 ENCOUNTER — Emergency Department (HOSPITAL_COMMUNITY): Payer: BC Managed Care – PPO

## 2021-05-21 ENCOUNTER — Inpatient Hospital Stay (HOSPITAL_COMMUNITY)
Admission: EM | Admit: 2021-05-21 | Discharge: 2021-05-23 | DRG: 617 | Disposition: A | Discharge status: Home / Self Care | Payer: BC Managed Care – PPO | Attending: Internal Medicine | Admitting: Internal Medicine

## 2021-05-21 ENCOUNTER — Encounter (HOSPITAL_COMMUNITY): Payer: Self-pay | Admitting: Emergency Medicine

## 2021-05-21 ENCOUNTER — Other Ambulatory Visit: Payer: Self-pay

## 2021-05-21 DIAGNOSIS — S92422B Displaced fracture of distal phalanx of left great toe, initial encounter for open fracture: Secondary | ICD-10-CM | POA: Diagnosis not present

## 2021-05-21 DIAGNOSIS — Z801 Family history of malignant neoplasm of trachea, bronchus and lung: Secondary | ICD-10-CM | POA: Diagnosis not present

## 2021-05-21 DIAGNOSIS — Z20822 Contact with and (suspected) exposure to covid-19: Secondary | ICD-10-CM | POA: Diagnosis present

## 2021-05-21 DIAGNOSIS — L0390 Cellulitis, unspecified: Secondary | ICD-10-CM | POA: Diagnosis not present

## 2021-05-21 DIAGNOSIS — Z8249 Family history of ischemic heart disease and other diseases of the circulatory system: Secondary | ICD-10-CM

## 2021-05-21 DIAGNOSIS — M7989 Other specified soft tissue disorders: Secondary | ICD-10-CM | POA: Diagnosis not present

## 2021-05-21 DIAGNOSIS — M869 Osteomyelitis, unspecified: Secondary | ICD-10-CM

## 2021-05-21 DIAGNOSIS — M84675A Pathological fracture in other disease, left foot, initial encounter for fracture: Secondary | ICD-10-CM | POA: Diagnosis present

## 2021-05-21 DIAGNOSIS — E1169 Type 2 diabetes mellitus with other specified complication: Secondary | ICD-10-CM | POA: Diagnosis present

## 2021-05-21 DIAGNOSIS — L97526 Non-pressure chronic ulcer of other part of left foot with bone involvement without evidence of necrosis: Secondary | ICD-10-CM | POA: Diagnosis present

## 2021-05-21 DIAGNOSIS — D6959 Other secondary thrombocytopenia: Secondary | ICD-10-CM | POA: Diagnosis present

## 2021-05-21 DIAGNOSIS — S92402B Displaced unspecified fracture of left great toe, initial encounter for open fracture: Secondary | ICD-10-CM

## 2021-05-21 DIAGNOSIS — L03116 Cellulitis of left lower limb: Secondary | ICD-10-CM | POA: Diagnosis present

## 2021-05-21 DIAGNOSIS — Z8349 Family history of other endocrine, nutritional and metabolic diseases: Secondary | ICD-10-CM

## 2021-05-21 DIAGNOSIS — Z880 Allergy status to penicillin: Secondary | ICD-10-CM

## 2021-05-21 DIAGNOSIS — Z23 Encounter for immunization: Secondary | ICD-10-CM

## 2021-05-21 DIAGNOSIS — Z833 Family history of diabetes mellitus: Secondary | ICD-10-CM | POA: Diagnosis not present

## 2021-05-21 DIAGNOSIS — F17290 Nicotine dependence, other tobacco product, uncomplicated: Secondary | ICD-10-CM | POA: Diagnosis present

## 2021-05-21 DIAGNOSIS — E11621 Type 2 diabetes mellitus with foot ulcer: Secondary | ICD-10-CM | POA: Diagnosis present

## 2021-05-21 DIAGNOSIS — M86172 Other acute osteomyelitis, left ankle and foot: Secondary | ICD-10-CM

## 2021-05-21 DIAGNOSIS — R197 Diarrhea, unspecified: Secondary | ICD-10-CM | POA: Diagnosis not present

## 2021-05-21 LAB — CBC WITH DIFFERENTIAL/PLATELET
Abs Immature Granulocytes: 0.1 10*3/uL — ABNORMAL HIGH (ref 0.00–0.07)
Abs Immature Granulocytes: 0.08 10*3/uL — ABNORMAL HIGH (ref 0.00–0.07)
Basophils Absolute: 0.1 10*3/uL (ref 0.0–0.1)
Basophils Absolute: 0.1 10*3/uL (ref 0.0–0.1)
Basophils Relative: 1 %
Basophils Relative: 1 %
Eosinophils Absolute: 0.2 10*3/uL (ref 0.0–0.5)
Eosinophils Absolute: 0.2 10*3/uL (ref 0.0–0.5)
Eosinophils Relative: 2 %
Eosinophils Relative: 2 %
HCT: 46.4 % (ref 39.0–52.0)
HCT: 49 % (ref 39.0–52.0)
Hemoglobin: 15.1 g/dL (ref 13.0–17.0)
Hemoglobin: 16.1 g/dL (ref 13.0–17.0)
Immature Granulocytes: 1 %
Immature Granulocytes: 1 %
Lymphocytes Relative: 17 %
Lymphocytes Relative: 18 %
Lymphs Abs: 2.2 10*3/uL (ref 0.7–4.0)
Lymphs Abs: 2.2 10*3/uL (ref 0.7–4.0)
MCH: 30.3 pg (ref 26.0–34.0)
MCH: 30.6 pg (ref 26.0–34.0)
MCHC: 32.5 g/dL (ref 30.0–36.0)
MCHC: 32.9 g/dL (ref 30.0–36.0)
MCV: 93 fL (ref 80.0–100.0)
MCV: 93.2 fL (ref 80.0–100.0)
Monocytes Absolute: 1 10*3/uL (ref 0.1–1.0)
Monocytes Absolute: 1 10*3/uL (ref 0.1–1.0)
Monocytes Relative: 8 %
Monocytes Relative: 8 %
Neutro Abs: 9.2 10*3/uL — ABNORMAL HIGH (ref 1.7–7.7)
Neutro Abs: 8.5 10*3/uL — ABNORMAL HIGH (ref 1.7–7.7)
Neutrophils Relative %: 71 %
Neutrophils Relative %: 70 %
Platelets: 153 10*3/uL (ref 150–400)
Platelets: 149 10*3/uL — ABNORMAL LOW (ref 150–400)
RBC: 4.99 MIL/uL (ref 4.22–5.81)
RBC: 5.26 MIL/uL (ref 4.22–5.81)
RDW: 12.4 % (ref 11.5–15.5)
RDW: 12.5 % (ref 11.5–15.5)
WBC: 12.7 10*3/uL — ABNORMAL HIGH (ref 4.0–10.5)
WBC: 12 10*3/uL — ABNORMAL HIGH (ref 4.0–10.5)
nRBC: 0 % (ref 0.0–0.2)
nRBC: 0 % (ref 0.0–0.2)

## 2021-05-21 LAB — COMPREHENSIVE METABOLIC PANEL
ALT: 22 U/L (ref 0–44)
ALT: 21 U/L (ref 0–44)
AST: 21 U/L (ref 15–41)
AST: 19 U/L (ref 15–41)
Albumin: 3.4 g/dL — ABNORMAL LOW (ref 3.5–5.0)
Albumin: 3.5 g/dL (ref 3.5–5.0)
Alkaline Phosphatase: 63 U/L (ref 38–126)
Alkaline Phosphatase: 63 U/L (ref 38–126)
Anion gap: 5 (ref 5–15)
Anion gap: 6 (ref 5–15)
BUN: 17 mg/dL (ref 6–20)
BUN: 16 mg/dL (ref 6–20)
CO2: 26 mmol/L (ref 22–32)
CO2: 27 mmol/L (ref 22–32)
Calcium: 9 mg/dL (ref 8.9–10.3)
Calcium: 9.1 mg/dL (ref 8.9–10.3)
Chloride: 107 mmol/L (ref 98–111)
Chloride: 105 mmol/L (ref 98–111)
Creatinine, Ser: 0.97 mg/dL (ref 0.61–1.24)
Creatinine, Ser: 0.98 mg/dL (ref 0.61–1.24)
GFR, Estimated: >60 mL/min (ref >60)
GFR, Estimated: >60 mL/min (ref >60)
Glucose, Bld: 164 mg/dL — ABNORMAL HIGH (ref 70–99)
Glucose, Bld: 80 mg/dL (ref 70–99)
Potassium: 4 mmol/L (ref 3.5–5.1)
Potassium: 4.3 mmol/L (ref 3.5–5.1)
Sodium: 138 mmol/L (ref 135–145)
Sodium: 138 mmol/L (ref 135–145)
Total Bilirubin: 0.5 mg/dL (ref 0.3–1.2)
Total Bilirubin: 0.5 mg/dL (ref 0.3–1.2)
Total Protein: 7.2 g/dL (ref 6.5–8.1)
Total Protein: 7.4 g/dL (ref 6.5–8.1)

## 2021-05-21 LAB — HEMOGLOBIN A1C
Hgb A1c MFr Bld: 5.3 % (ref 4.8–5.6)
Mean Plasma Glucose: 105.41 mg/dL

## 2021-05-21 LAB — CBC
HCT: 45.2 % (ref 39.0–52.0)
Hemoglobin: 15.2 g/dL (ref 13.0–17.0)
MCH: 30.7 pg (ref 26.0–34.0)
MCHC: 33.6 g/dL (ref 30.0–36.0)
MCV: 91.3 fL (ref 80.0–100.0)
Platelets: 148 10*3/uL — ABNORMAL LOW (ref 150–400)
RBC: 4.95 MIL/uL (ref 4.22–5.81)
RDW: 12.5 % (ref 11.5–15.5)
WBC: 9.4 10*3/uL (ref 4.0–10.5)
nRBC: 0 % (ref 0.0–0.2)

## 2021-05-21 LAB — C-REACTIVE PROTEIN: CRP: 2.7 mg/dL — ABNORMAL HIGH (ref <1.0)

## 2021-05-21 LAB — SEDIMENTATION RATE: Sed Rate: 33 mm/hr — ABNORMAL HIGH (ref 0–16)

## 2021-05-21 LAB — HIV ANTIBODY (ROUTINE TESTING W REFLEX): HIV Screen 4th Generation wRfx: NONREACTIVE

## 2021-05-21 LAB — LACTIC ACID, PLASMA: Lactic Acid, Venous: 1.9 mmol/L (ref 0.5–1.9)

## 2021-05-21 MED ORDER — ACETAMINOPHEN 325 MG PO TABS: 650.0000 mg | ORAL_TABLET | Freq: Four times a day (QID) | ORAL | Status: DC | PRN

## 2021-05-21 MED ORDER — INSULIN ASPART 100 UNIT/ML IJ SOLN: 0.0000 [IU] | Freq: Three times a day (TID) | INTRAMUSCULAR | Status: DC

## 2021-05-21 MED ORDER — VANCOMYCIN HCL 1250 MG/250ML IV SOLN
1250.0000 mg | Freq: Two times a day (BID) | INTRAVENOUS | Status: DC
  Administered 2021-05-22 (×2): 1250 mg via INTRAVENOUS
  Filled 2021-05-21 (×4): qty 250

## 2021-05-21 MED ORDER — METRONIDAZOLE 500 MG/100ML IV SOLN
500.0000 mg | Freq: Three times a day (TID) | INTRAVENOUS | Status: DC
  Administered 2021-05-21 – 2021-05-23 (×5): 500 mg via INTRAVENOUS
  Filled 2021-05-21 (×5): qty 100

## 2021-05-21 MED ORDER — ACETAMINOPHEN 650 MG RE SUPP: 650.0000 mg | Freq: Four times a day (QID) | RECTAL | Status: DC | PRN

## 2021-05-21 MED ORDER — SODIUM CHLORIDE 0.9 % IV SOLN
2.0000 g | Freq: Once | INTRAVENOUS | Status: AC
  Administered 2021-05-21: 2 g via INTRAVENOUS
  Filled 2021-05-21: qty 2

## 2021-05-21 MED ORDER — SODIUM CHLORIDE 0.9 % IV SOLN
2.0000 g | Freq: Three times a day (TID) | INTRAVENOUS | Status: DC
  Administered 2021-05-21 – 2021-05-23 (×5): 2 g via INTRAVENOUS
  Filled 2021-05-21 (×8): qty 2

## 2021-05-21 MED ORDER — VANCOMYCIN HCL 10 G IV SOLR
2500.0000 mg | Freq: Once | INTRAVENOUS | Status: AC
  Administered 2021-05-21: 2500 mg via INTRAVENOUS
  Filled 2021-05-21: qty 2500

## 2021-05-21 MED ORDER — TETANUS-DIPHTH-ACELL PERTUSSIS 5-2.5-18.5 LF-MCG/0.5 IM SUSY
0.5000 mL | PREFILLED_SYRINGE | Freq: Once | INTRAMUSCULAR | Status: AC
  Administered 2021-05-21: 0.5 mL via INTRAMUSCULAR
  Filled 2021-05-21: qty 0.5

## 2021-05-21 MED ORDER — INSULIN ASPART 100 UNIT/ML IJ SOLN: 0.0000 [IU] | Freq: Every day | INTRAMUSCULAR | Status: DC

## 2021-05-21 MED ORDER — ENOXAPARIN SODIUM 80 MG/0.8ML IJ SOSY
65.0000 mg | PREFILLED_SYRINGE | INTRAMUSCULAR | Status: DC
  Administered 2021-05-22: 65 mg via SUBCUTANEOUS
  Filled 2021-05-21 (×2): qty 0.8

## 2021-05-21 MED ORDER — LORATADINE 10 MG PO TABS
10.0000 mg | ORAL_TABLET | Freq: Every day | ORAL | Status: DC
  Administered 2021-05-23: 10 mg via ORAL
  Filled 2021-05-21: qty 1

## 2021-05-21 MED ORDER — CALCIUM CARBONATE ANTACID 500 MG PO CHEW
1.0000 | CHEWABLE_TABLET | Freq: Once | ORAL | Status: AC
  Administered 2021-05-21: 200 mg via ORAL
  Filled 2021-05-21: qty 1

## 2021-05-21 MED ORDER — ENOXAPARIN SODIUM 40 MG/0.4ML IJ SOSY: 40.0000 mg | PREFILLED_SYRINGE | INTRAMUSCULAR | Status: DC

## 2021-05-21 NOTE — Plan of Care (Signed)
  Problem: Education: Goal: Knowledge of General Education information will improve Description: Including pain rating scale, medication(s)/side effects and non-pharmacologic comfort measures Outcome: Progressing   Problem: Clinical Measurements: Goal: Diagnostic test results will improve Outcome: Progressing   Problem: Pain Managment: Goal: General experience of comfort will improve Outcome: Progressing   Problem: Skin Integrity: Goal: Risk for impaired skin integrity will decrease Outcome: Progressing   Problem: Safety: Goal: Ability to remain free from injury will improve Outcome: Progressing

## 2021-05-21 NOTE — Progress Notes (Signed)
VASCULAR LAB    Left lower extremity venous duplex has been performed.  See CV proc for preliminary results.  Messaged results to Dr. Delford Field via secure chat  Sherren Kerns, RVT 05/21/2021, 12:50 PM

## 2021-05-21 NOTE — Progress Notes (Signed)
Pharmacy Antibiotic Note  Evan Hamilton is a 55 y.o. male admitted on 05/21/2021 with  Toe infection / osteomyelitis .  Pharmacy has been consulted for cefepime and vancomycin dosing for osteomyelitis.  Patient has penicillin allergy noted but has tolerated cephalosporins in the past.  Plan: Cefepime 2g q8h Vancomycin 2500mg  once then vancomycin 1250 mg q12h Pharmacy to adjust dosing and frequency as indicated per renal function F/u cultures     Temp (24hrs), Avg:98.5 F (36.9 C), Min:97.9 F (36.6 C), Max:99 F (37.2 C)  Recent Labs  Lab 05/21/21 0846 05/21/21 1046 05/21/21 1159  WBC 12.7*  --  12.0*  CREATININE 0.97  --  0.98  LATICACIDVEN  --  1.9  --     CrCl cannot be calculated (Unknown ideal weight.).    Allergies  Allergen Reactions   Penicillins Hives and Rash    Tolerates Cephalosporins    Antimicrobials this admission: Vancomycin 7/2 >>  Cefepime 7/2 >>  Metronidazole 7/2 >>  Microbiology results: pending  Thank you for allowing pharmacy to be a part of this patient's care.  9/2 05/21/2021 1:50 PM

## 2021-05-21 NOTE — H&P (Signed)
History and Physical:    Evan Hamilton   GNF:[id number] DOB: [birth year] DOA: 05/21/2021  Referring MD/provider: Dr. Delford Field PCP: Patient, No Pcp Per (Inactive)   Patient coming from: Home  Chief Complaint: Left toe infection  History of Present Illness:   Evan Hamilton is an 55 y.o. male with previous history of DM2 which has resolved with 110 pound weight loss was in his usual state of health until 6 months ago when he developed a blister left first toe after hiking.  The blister subsequently ulcerated.  He has since then been under the care of a podiatrist however the ulcer has never healed.  Last month patient [place] and 2 days after that had significant swelling and redness of his first toe.  Patient continued to see his podiatrist who debrided it and put him on oral Bactrim.  Of note, patient has neuropathy and does not have much feeling in his feet.   Four days ago patient noted swelling of his ankle going up to his lower leg.   Patient admits to feeling somewhat chilly and may be a little feverish yesterday. He went to see his podiatrist who told him to come to the ED.   ED Course:  The patient was noted to have fever of 99 with otherwise normal vital signs.  He had mild leukocytosis at 12.7.Plain films showed a fracture of left distal phalanx of first toe along with changes consistent with osteomyelitis.  DVT was ruled out.  He was started on vancomycin and cefepime.  Please see pictures from ED physician Dr. Lynelle Doctor note.  ROS:   ROS   Review of Systems: Eyes: Denies recent change in vision, no discharge, redness, pain noted Endocrine: Denies heat/cold intolerance, polyuria or weight loss. Respiratory: Denies cough, SOB at rest or hemoptysis Cardiovascular: Denies chest pain or palpitations GI: Denies nausea, vomiting, diarrhea or constipation GU: Denies dysuria, frequency or hematuria CNS: Denies HA, dizziness, confusion, new  weakness or clumsiness. Blood/lymphatics: Denies easy bruising or bleeding Mood/affect: Denies anxiety/depression    Past Medical History:   Past Medical History:  Diagnosis Date   Allergy    Anxiety and depression    Diabetes mellitus without complication (HCC)     Past Surgical History:   Past Surgical History:  Procedure Laterality Date   CHOLECYSTECTOMY N/A 07/22/2014   Procedure: LAPAROSCOPIC CHOLECYSTECTOMY WITH INTRAOPERATIVE CHOLANGIOGRAM;  Surgeon: Harriette Bouillon, MD;  Location: [facility] OR;  Service: General;  Laterality: N/A;    Social History:   Social History   Socioeconomic History   Marital status: Single    Spouse name: Not on file   Number of children: 0   Years of education: college   Highest education level: Not on file  Occupational History   Not on file  Tobacco Use   Smoking status: Some Days    Packs/day: 0.25    Years: 20.00    Pack years: 5.00    Types: Cigars, Cigarettes   Smokeless tobacco: Never  Substance and Sexual Activity   Alcohol use: Yes    Alcohol/week: 3.0 standard drinks    Types: 3 Standard drinks or equivalent per week   Drug use: No   Sexual activity: Never  Other Topics Concern   Not on file  Social History Narrative   Drinks "some" soda and a "couple" of 5 hour energy drinks a week.    Social Determinants of Health   Financial Resource Strain: Not on  file  Food Insecurity: Not on file  Transportation Needs: Not on file  Physical Activity: Not on file  Stress: Not on file  Social Connections: Not on file  Intimate Partner Violence: Not on file    Allergies   Penicillins  Family history:   Family History  Problem Relation Age of Onset   Cancer Mother        Lung   Diabetes Mother    Heart disease Father    Hyperlipidemia Father    Hypertension Father    Mental illness Sister    Cancer Maternal Grandmother        Lung   Diabetes Maternal Grandmother     Current Medications:   Prior to Admission  medications   Medication Sig Start Date End Date Taking? Authorizing Provider  loratadine (CLARITIN) 10 MG tablet Take 10 mg by mouth 2 (two) times daily.    [provider]  Multiple Vitamin (MULTIVITAMIN WITH MINERALS) TABS tablet Take 1 tablet by mouth daily.    [provider]  sulfamethoxazole-trimethoprim (BACTRIM DS) 800-160 MG tablet Take 1 tablet by mouth 2 (two) times daily. 05/13/21   [provider]    Physical Exam:   Vitals:   05/21/21 0841 05/21/21 1101 05/21/21 1309  BP: 135/75 (!) 117/95 113/74  Pulse: 92 96 78  Resp: 16 16 15   Temp: 99 F (37.2 C) 97.9 F (36.6 C)   TempSrc: Oral Oral   SpO2: 97% 100% 98%     Physical Exam: Blood pressure 113/74, pulse 78, temperature 97.9 F (36.6 C), temperature source Oral, resp. rate 15, SpO2 98 %. Gen: Well-appearing man lying in bed with a macerated, red left first toe in no acute distress. Eyes: sclera anicteric, conjuctiva mildly injected bilaterally CVS: S1-S2, regulary, no gallops Respiratory:  decreased air entry likely secondary to decreased inspiratory effort GI: NABS, soft, NT  LE: Left first toe is macerated and barely recognizable with erythema and ulcers.  He has mild erythema extending proximally up to first MTP joint.  He has 1+ edema above his ankles on the left.  Right foot shows onychomycoses of right first toe.  He has 1+ DP pulses on left nd no palpable pulses on right. Neuro: A/O x 3, Moving all extremities equally with normal strength, CN 3-12 intact, grossly nonfocal.  Psych: patient is logical and coherent, judgement and insight appear normal, mood and affect appropriate to situation.   Data Review:    Labs: Basic Metabolic Panel: Recent Labs  Lab 05/21/21 0846  NA 138  K 4.0  CL 107  CO2 26  GLUCOSE 164*  BUN 17  CREATININE 0.97  CALCIUM 9.0   Liver Function Tests: Recent Labs  Lab 05/21/21 0846  AST 21  ALT 22  ALKPHOS 63  BILITOT 0.5  PROT 7.2   ALBUMIN 3.4*   No results for input(s): LIPASE, AMYLASE in the last 168 hours. No results for input(s): AMMONIA in the last 168 hours. CBC: Recent Labs  Lab 05/21/21 0846 05/21/21 1159  WBC 12.7* 12.0*  NEUTROABS 9.2* 8.5*  HGB 15.1 16.1  HCT 46.4 49.0  MCV 93.0 93.2  PLT 153 149*   Cardiac Enzymes: No results for input(s): CKTOTAL, CKMB, CKMBINDEX, TROPONINI in the last 168 hours.  BNP (last 3 results) No results for input(s): PROBNP in the last 8760 hours. CBG: No results for input(s): GLUCAP in the last 168 hours.  Urinalysis    Component Value Date/Time   COLORURINE YELLOW 11/25/2016  1725   APPEARANCEUR HAZY (A) 11/25/2016 1725   LABSPEC 1.008 11/25/2016 1725   PHURINE 5.0 11/25/2016 1725   GLUCOSEU NEGATIVE 11/25/2016 1725   HGBUR MODERATE (A) 11/25/2016 1725   BILIRUBINUR NEGATIVE 11/25/2016 1725   BILIRUBINUR negative 11/25/2016 1323   BILIRUBINUR negative 05/26/2016 1103   KETONESUR NEGATIVE 11/25/2016 1725   KETONESUR negative 11/25/2016 1323   PROTEINUR 30 (A) 11/25/2016 1725   PROTEINUR =30 (A) 11/25/2016 1323   UROBILINOGEN 0.2 11/25/2016 1323   NITRITE NEGATIVE 11/25/2016 1725   NITRITE Negative 11/25/2016 1323   LEUKOCYTESUR LARGE (A) 11/25/2016 1725      Radiographic Studies: DG Toe Great Left  Result Date: 05/21/2021 CLINICAL DATA:  Toe swelling. EXAM: LEFT GREAT TOE COMPARISON:  None. FINDINGS: Oblique fracture noted mid aspect of the distal phalanx. There is cortical loss along the tuft of the distal phalanx consistent with osteomyelitis, best noted on the oblique film. Prominent soft tissue swelling noted great toe. IMPRESSION: 1. Mildly displaced oblique fracture in the distal phalanx with associated soft tissue swelling. 2. Cortical loss along the tuft of the distal phalanx consistent with osteomyelitis. Electronically Signed   By: Kennith Center M.D.   On: 05/21/2021 12:39   VAS [place] LOWER EXTREMITY VENOUS (DVT) (ONLY [facility] & [facility] 7a-7p)  Result  Date: 05/21/2021  Lower Venous DVT Study Patient Name:  [place] SHELLHAMMER  Date of Exam:   05/21/2021 Medical Rec #: [id number]     Accession #:    [id number] Date of Birth: Apr 07, 1966    Patient Gender: M Patient Age:   54Y Exam Location:  [facility] Procedure:      VAS [place] LOWER EXTREMITY VENOUS (DVT) Referring Phys: [id number] ANNA G WRIGHT --------------------------------------------------------------------------------  Indications: Swelling, and Ulceration of great toe.  Performing Technologist: Sherren Kerns RVS  Examination Guidelines: A complete evaluation includes B-mode imaging, spectral Doppler, color Doppler, and power Doppler as needed of all accessible portions of each vessel. Bilateral testing is considered an integral part of a complete examination. Limited examinations for reoccurring indications may be performed as noted. The reflux portion of the exam is performed with the patient in reverse Trendelenburg.  +-----+---------------+---------+-----------+----------+--------------+ RIGHTCompressibilityPhasicitySpontaneityPropertiesThrombus Aging +-----+---------------+---------+-----------+----------+--------------+ CFV  Full           Yes      Yes                                 +-----+---------------+---------+-----------+----------+--------------+   +---------+---------------+---------+-----------+----------+--------------+ LEFT     CompressibilityPhasicitySpontaneityPropertiesThrombus Aging +---------+---------------+---------+-----------+----------+--------------+ CFV      Full           Yes      Yes                                 +---------+---------------+---------+-----------+----------+--------------+ SFJ      Full                                                        +---------+---------------+---------+-----------+----------+--------------+ FV Prox  Full                                                         +---------+---------------+---------+-----------+----------+--------------+  FV Mid   Full                                                        +---------+---------------+---------+-----------+----------+--------------+ FV DistalFull                                                        +---------+---------------+---------+-----------+----------+--------------+ PFV      Full                                                        +---------+---------------+---------+-----------+----------+--------------+ POP      Full           Yes      Yes                                 +---------+---------------+---------+-----------+----------+--------------+ PTV      Full                                                        +---------+---------------+---------+-----------+----------+--------------+ PERO     Full                                                        +---------+---------------+---------+-----------+----------+--------------+     Summary: RIGHT: - No evidence of common femoral vein obstruction.  LEFT: - There is no evidence of deep vein thrombosis in the lower extremity.  *See table(s) above for measurements and observations.    Preliminary     EKG: Independently reviewed.  Ordered and pending   Assessment/Plan:   Active Problems:   Osteomyelitis of ankle or foot, acute, left (HCC)   Open fracture of left great toe   55 year old with previous history of DM2 and neuropathy presents with open fracture with osteomyelitis and complex infection of left first toe now with cellulitis.  Left first toe STI and OM now with mild cellulitis of foot. Open fracture with OM and complex infection much worse after exposure [place] last month. Agree with continuation of vancomycin and cefepime to cover MRSA and Pseudomonas. Patient is PCN allergic, will add Flagyl, discussed with ID. No need to cover for Marcie MowersM. Marinarum at present  Orthopedics was called who  noted patient would likely need amputation however they will come and consult later.  History of DM2 Patient has had 110 pound weight loss and notes his hemoglobin A1c's have been around 5.2 He admits he has not had a hemoglobin A1c in a while Hemoglobin A1c ordered and pending If elevated, can add SSI AC at bedtime and change to a carb controlled diet  Chronic allergies Continue loratadine    Other information:   DVT prophylaxis: Lovenox ordered. Code Status: Full Family Communication: None, patient states he is in contact with his family Disposition Plan: Home Consults called: Orthopedics Admission status: Inpatient  Nadira Single Tublu Hanah Moultry [facility]  If 7PM-7AM, please contact night-coverage [email or web address]

## 2021-05-21 NOTE — ED Triage Notes (Signed)
Pt reports L leg swelling x 1 week that he relates to L great toe infection x 6 months that has gradually been getting worse since [place] Day.  Reports feeling like he has a fever since this morning.

## 2021-05-21 NOTE — ED Notes (Signed)
Patient transported to X-ray 

## 2021-05-21 NOTE — ED Provider Notes (Signed)
[facility]   CSN: [id number] Arrival date & time: 05/21/21  [id number]     History No chief complaint on file.   Evan PartyMark Holberg is a 55 y.o. male.  About 6 months ago, he took a hike.  He developed a blister on 3 of his toes including his left great toe.  He was treating his blisters under the care of a podiatrist, and then about a month ago, he went to swim [place].  After that point, his symptoms dramatically worsened.  He has been having more diffuse redness and discoloration about the toe.  He is also felt slightly feverish.  He has been taking Bactrim and using topical antibiotics.  He was directed to the ER by his podiatrist.  What is listed in his chart, he denies having diabetes.  He states that he has lost a significant amount of weight and no longer has this diagnosis.  However, he states that he does have some chronic numbness in his feet.  The history is provided by the patient.  Toe Pain This is a chronic problem. Episode onset: 6 months ago, worse for the past month. The problem has been gradually worsening. Pertinent negatives include no chest pain, no abdominal pain, no headaches and no shortness of breath. The symptoms are aggravated by walking. Nothing relieves the symptoms. Treatments tried: Bactroban, Bactrim, collagenase ointment, wound care. The treatment provided no relief.      Past Medical History:  Diagnosis Date  . Allergy   . Anxiety and depression   . Diabetes mellitus without complication [facility](HCC)     Patient Active Problem List   Diagnosis Date Noted  . AKI (acute kidney injury) (HCC) 11/25/2016  . Pyelonephritis 11/25/2016  . Acute biliary pancreatitis 07/18/2014    Past Surgical History:  Procedure Laterality Date  . CHOLECYSTECTOMY N/A 07/22/2014   Procedure: LAPAROSCOPIC CHOLECYSTECTOMY WITH INTRAOPERATIVE CHOLANGIOGRAM;  Surgeon: Harriette Bouillonhomas Cornett, MD;  Location: [facility] OR;  Service: General;   Laterality: N/A;       Family History  Problem Relation Age of Onset  . Cancer Mother        Lung  . Diabetes Mother   . Heart disease Father   . Hyperlipidemia Father   . Hypertension Father   . Mental illness Sister   . Cancer Maternal Grandmother        Lung  . Diabetes Maternal Grandmother     Social History   Tobacco Use  . Smoking status: Some Days    Packs/day: 0.25    Years: 20.00    Pack years: 5.00    Types: Cigars, Cigarettes  . Smokeless tobacco: Never  Substance Use Topics  . Alcohol use: Yes    Alcohol/week: 3.0 standard drinks    Types: 3 Standard drinks or equivalent per week  . Drug use: No    Home Medications Prior to Admission medications   Medication Sig Start Date End Date Taking? Authorizing Provider  loratadine (CLARITIN) 10 MG tablet Take 10 mg by mouth 2 (two) times daily.    [provider]  Multiple Vitamin (MULTIVITAMIN WITH MINERALS) TABS tablet Take 1 tablet by mouth daily.    [provider]    Allergies    Penicillins  Review of Systems   Review of Systems  Constitutional:  Positive for fever. Negative for chills.  HENT:  Negative for ear pain and sore throat.   Eyes:  Negative for pain and visual  disturbance.  Respiratory:  Negative for cough and shortness of breath.   Cardiovascular:  Positive for leg swelling. Negative for chest pain and palpitations.  Gastrointestinal:  Negative for abdominal pain and vomiting.  Genitourinary:  Negative for dysuria and hematuria.  Musculoskeletal:  Negative for arthralgias and back pain.  Skin:  Positive for color change and wound. Negative for rash.  Neurological:  Negative for seizures, syncope and headaches.  All other systems reviewed and are negative.  Physical Exam Updated Vital Signs BP (!) 117/95 (BP Location: Left Arm)   Pulse 96   Temp 97.9 F (36.6 C) (Oral)   Resp 16   SpO2 100%   Physical Exam Vitals and nursing note reviewed.  Constitutional:       Appearance: Normal appearance.  HENT:     Head: Normocephalic and atraumatic.  Eyes:     Conjunctiva/sclera: Conjunctivae normal.  Pulmonary:     Effort: Pulmonary effort is normal. No respiratory distress.  Musculoskeletal:     Cervical back: Normal range of motion.     Right lower leg: No edema.     Left lower leg: No edema.     Comments: There is mild swelling of the left lower leg without palpable venous cords.  He does have some superficial varicosities bilaterally.  There is no evidence of muscular or bony deformity.  Skin:    General: Skin is warm.     Findings: No bruising.     Comments: The entire left great toe is affected by a somewhat diffuse skin breakdown and inflammation.  There is evidence of skin denuding and an ulcer formation both on the plantar and the dorsal aspect.  There is some dark discoloration as well, and the entire toe is swollen.  Neurological:     General: No focal deficit present.     Mental Status: He is alert and oriented to person, place, and time. Mental status is at baseline.  Psychiatric:        Mood and Affect: Mood normal.       ED Results / Procedures / Treatments   Labs (all labs ordered are listed, but only abnormal results are displayed) Labs Reviewed  COMPREHENSIVE METABOLIC PANEL - Abnormal; Notable for the following components:      Result Value   Glucose, Bld 164 (*)    Albumin 3.4 (*)    All other components within normal limits  CBC WITH DIFFERENTIAL/PLATELET - Abnormal; Notable for the following components:   WBC 12.7 (*)    Neutro Abs 9.2 (*)    Abs Immature Granulocytes 0.10 (*)    All other components within normal limits  CBC WITH DIFFERENTIAL/PLATELET - Abnormal; Notable for the following components:   WBC 12.0 (*)    Platelets 149 (*)    Neutro Abs 8.5 (*)    Abs Immature Granulocytes 0.08 (*)    All other components within normal limits  SARS CORONAVIRUS 2 (TAT 6-24 HRS)  LACTIC ACID, PLASMA  LACTIC ACID,  PLASMA  COMPREHENSIVE METABOLIC PANEL  SEDIMENTATION RATE  C-REACTIVE PROTEIN  HEMOGLOBIN A1C    EKG None  Radiology DG Toe Great Left  Result Date: 05/21/2021 CLINICAL DATA:  Toe swelling. EXAM: LEFT GREAT TOE COMPARISON:  None. FINDINGS: Oblique fracture noted mid aspect of the distal phalanx. There is cortical loss along the tuft of the distal phalanx consistent with osteomyelitis, best noted on the oblique film. Prominent soft tissue swelling noted great toe. IMPRESSION: 1. Mildly displaced oblique fracture in  the distal phalanx with associated soft tissue swelling. 2. Cortical loss along the tuft of the distal phalanx consistent with osteomyelitis. Electronically Signed   By: Kennith Center M.D.   On: 05/21/2021 12:39   VAS [place] LOWER EXTREMITY VENOUS (DVT) (ONLY [facility] & [facility] 7a-7p)  Result Date: 05/21/2021  Lower Venous DVT Study Patient Name:  SINCLAIR ALLIGOOD  Date of Exam:   05/21/2021 Medical Rec #: [id number]     Accession #:    [id number] Date of Birth: 09/03/1966    Patient Gender: M Patient Age:   54Y Exam Location:  [facility] Procedure:      VAS [place] LOWER EXTREMITY VENOUS (DVT) Referring Phys: [id number] Mingo Siegert G WRIGHT --------------------------------------------------------------------------------  Indications: Swelling, and Ulceration of great toe.  Performing Technologist: Sherren Kerns RVS  Examination Guidelines: A complete evaluation includes B-mode imaging, spectral Doppler, color Doppler, and power Doppler as needed of all accessible portions of each vessel. Bilateral testing is considered an integral part of a complete examination. Limited examinations for reoccurring indications may be performed as noted. The reflux portion of the exam is performed with the patient in reverse Trendelenburg.  +-----+---------------+---------+-----------+----------+--------------+ RIGHTCompressibilityPhasicitySpontaneityPropertiesThrombus Aging  +-----+---------------+---------+-----------+----------+--------------+ CFV  Full           Yes      Yes                                 +-----+---------------+---------+-----------+----------+--------------+   +---------+---------------+---------+-----------+----------+--------------+ LEFT     CompressibilityPhasicitySpontaneityPropertiesThrombus Aging +---------+---------------+---------+-----------+----------+--------------+ CFV      Full           Yes      Yes                                 +---------+---------------+---------+-----------+----------+--------------+ SFJ      Full                                                        +---------+---------------+---------+-----------+----------+--------------+ FV Prox  Full                                                        +---------+---------------+---------+-----------+----------+--------------+ FV Mid   Full                                                        +---------+---------------+---------+-----------+----------+--------------+ FV DistalFull                                                        +---------+---------------+---------+-----------+----------+--------------+ PFV      Full                                                        +---------+---------------+---------+-----------+----------+--------------+  POP      Full           Yes      Yes                                 +---------+---------------+---------+-----------+----------+--------------+ PTV      Full                                                        +---------+---------------+---------+-----------+----------+--------------+ PERO     Full                                                        +---------+---------------+---------+-----------+----------+--------------+     Summary: RIGHT: - No evidence of common femoral vein obstruction.  LEFT: - There is no evidence of deep vein thrombosis in the  lower extremity.  *See table(s) above for measurements and observations.    Preliminary     Procedures Procedures   Medications Ordered in ED Medications  vancomycin (VANCOCIN) 2,500 mg in sodium chloride 0.9 % 500 mL IVPB (has no administration in time range)  ceFEPIme (MAXIPIME) 2 g in sodium chloride 0.9 % 100 mL IVPB (2 g Intravenous New Bag/Given 05/21/21 1310)    ED Course  I have reviewed the triage vital signs and the nursing notes.  Pertinent labs & imaging results that were available during my care of the patient were reviewed by me and considered in my medical decision making (see chart for details).  Clinical Course as of 05/21/21 1334  Sat May 21, 2021  1320 I spoke with Dr. Luberta Robertson who will admit the patient. [AW]  1327 Dr. Magnus Ivan with ortho will add patient to the list. Patient will not have surgery today given the chronicity of the issue. [AW]    Clinical Course User Index [AW] Koleen Distance, MD   MDM Rules/Calculators/A&P                          Evan Hamilton presented to the emergency department with 6 months of an open wound on his left great toe.  He was evaluated for evidence of cellulitis, osteomyelitis.  It does appear that he has osteomyelitis, and surprisingly, he also has a fracture which was not noted prior to this visit.  I considered whether or not he had vascular pathology such as a DVT because he did have some calf pain.  Ultrasound was negative.  He has palpable pulses, and arterial insufficiency was not deemed likely.  He was given antibiotics for osteomyelitis and will be admitted for further treatment. Final Clinical Impression(s) / ED Diagnoses Final diagnoses:  Osteomyelitis of great toe of left foot (HCC)  Open displaced fracture of distal phalanx of left great toe, initial encounter    Rx / DC Orders ED Discharge Orders     None        Koleen Distance, MD 05/21/21 1334

## 2021-05-21 NOTE — Consult Note (Signed)
Reason for Consult: Left great toe infection Referring Physician:Wright, Hamilton Hamilton  Hamilton Hamilton is an 55 y.o. male.  HPI:  Patient is a 55 year old male who sustained a blister to his left great toe after a hike 6 months ago.  He has been followed by podiatrist for a nonhealing ulcer.  Last month [place] 2 days after that had significant swelling and redness of his left great toe.  He was placed on Bactrim and continued debridements by the podiatrist.  4 days ago he noted swelling of his ankle going up into his calf area.  He denies any fevers.  But he did have some chills.  He is admitted by medicine and started on vancomycin and cefepime.  Radiographs were obtained in the ER and showed a fracture of the distal phalanx left great toe along with the changes at the distal phalanx consistent with osteomyelitis.  Doppler was performed to rule out DVT and was negative.  Patient notes that the swelling in his calf is dramatically diminished since being admitted. Patient states that he has been on a keto diet and is lost significant amount of weight Hamilton Hamilton has diabetes.  His hemoglobin A1c was 5.3 today.  Past Medical History:  Diagnosis Date   Allergy    Anxiety and depression    Diabetes mellitus without complication (HCC)     Past Surgical History:  Procedure Laterality Date   CHOLECYSTECTOMY N/A 07/22/2014   Procedure: LAPAROSCOPIC CHOLECYSTECTOMY WITH INTRAOPERATIVE CHOLANGIOGRAM;  Surgeon: Harriette Bouillonhomas Cornett, MD;  Location: [facility] OR;  Service: General;  Laterality: N/A;    Family History  Problem Relation Age of Onset   Cancer Mother        Lung   Diabetes Mother    Heart disease Father    Hyperlipidemia Father    Hypertension Father    Mental illness Sister    Cancer Maternal Grandmother        Lung   Diabetes Maternal Grandmother     Social History:  reports that he has been smoking cigars and cigarettes. He has a 5.00 pack-year smoking history. He has never used  smokeless tobacco. He reports current alcohol use of about 3.0 standard drinks of alcohol per week. He reports that he does not use drugs.  Allergies:  Allergies  Allergen Reactions   Penicillins Hives and Rash    Tolerates Cephalosporins    Medications: I have reviewed the patient's current medications.  Results for orders placed or performed during the hospital encounter of 05/21/21 (from the past 48 hour(s))  Comprehensive metabolic panel     Status: Abnormal   Collection Time: 05/21/21  8:46 AM  Result Value Ref Range   Sodium 138 135 - 145 mmol/L   Potassium 4.0 3.5 - 5.1 mmol/L   Chloride 107 98 - 111 mmol/L   CO2 26 22 - 32 mmol/L   Glucose, Bld 164 (H) 70 - 99 mg/dL    Comment: Glucose reference range applies only to samples taken after fasting for at least 8 hours.   BUN 17 6 - 20 mg/dL   Creatinine, Ser 1.610.97 0.61 - 1.24 mg/dL   Calcium 9.0 8.9 - 09.610.3 mg/dL   Total Protein 7.2 6.5 - 8.1 g/dL   Albumin 3.4 (L) 3.5 - 5.0 g/dL   AST 21 15 - 41 U/L   ALT 22 0 - 44 U/L   Alkaline Phosphatase 63 38 - 126 U/L   Total Bilirubin 0.5 0.3 - 1.2  mg/dL   GFR, Estimated >40 >98 mL/min    Comment: (NOTE) Calculated using the CKD-EPI Creatinine Equation (2021)    Anion gap 5 5 - 15    Comment: Performed at [facility], [address], [place], [place] [address]  CBC with Differential     Status: Abnormal   Collection Time: 05/21/21  8:46 AM  Result Value Ref Range   WBC 12.7 (H) 4.0 - 10.5 K/uL   RBC 4.99 4.22 - 5.81 MIL/uL   Hemoglobin 15.1 13.0 - 17.0 g/dL   HCT 78.2 95.6 - 21.3 %   MCV 93.0 80.0 - 100.0 fL   MCH 30.3 26.0 - 34.0 pg   MCHC 32.5 30.0 - 36.0 g/dL   RDW 08.6 57.8 - 46.9 %   Platelets 153 150 - 400 K/uL   nRBC 0.0 0.0 - 0.2 %   Neutrophils Relative % 71 %   Neutro Abs 9.2 (H) 1.7 - 7.7 K/uL   Lymphocytes Relative 17 %   Lymphs Abs 2.2 0.7 - 4.0 K/uL   Monocytes Relative 8 %   Monocytes Absolute 1.0 0.1 - 1.0 K/uL   Eosinophils Relative 2 %    Eosinophils Absolute 0.2 0.0 - 0.5 K/uL   Basophils Relative 1 %   Basophils Absolute 0.1 0.0 - 0.1 K/uL   Immature Granulocytes 1 %   Abs Immature Granulocytes 0.10 (H) 0.00 - 0.07 K/uL    Comment: Performed at [facility], [address], [place], [place] [address]  Lactic acid, plasma     Status: None   Collection Time: 05/21/21 10:46 AM  Result Value Ref Range   Lactic Acid, Venous 1.9 0.5 - 1.9 mmol/L    Comment: Performed at [facility], [address] [place], [place] [address]  CBC with Differential     Status: Abnormal   Collection Time: 05/21/21 11:59 AM  Result Value Ref Range   WBC 12.0 (H) 4.0 - 10.5 K/uL   RBC 5.26 4.22 - 5.81 MIL/uL   Hemoglobin 16.1 13.0 - 17.0 g/dL   HCT 44.0 10.2 - 72.5 %   MCV 93.2 80.0 - 100.0 fL   MCH 30.6 26.0 - 34.0 pg   MCHC 32.9 30.0 - 36.0 g/dL   RDW 36.6 44.0 - 34.7 %   Platelets 149 (L) 150 - 400 K/uL   nRBC 0.0 0.0 - 0.2 %   Neutrophils Relative % 70 %   Neutro Abs 8.5 (H) 1.7 - 7.7 K/uL   Lymphocytes Relative 18 %   Lymphs Abs 2.2 0.7 - 4.0 K/uL   Monocytes Relative 8 %   Monocytes Absolute 1.0 0.1 - 1.0 K/uL   Eosinophils Relative 2 %   Eosinophils Absolute 0.2 0.0 - 0.5 K/uL   Basophils Relative 1 %   Basophils Absolute 0.1 0.0 - 0.1 K/uL   Immature Granulocytes 1 %   Abs Immature Granulocytes 0.08 (H) 0.00 - 0.07 K/uL    Comment: Performed at [facility], [address] [place], [place] [address]  Comprehensive metabolic panel     Status: None   Collection Time: 05/21/21 11:59 AM  Result Value Ref Range   Sodium 138 135 - 145 mmol/L   Potassium 4.3 3.5 - 5.1 mmol/L   Chloride 105 98 - 111 mmol/L   CO2 27 22 - 32 mmol/L   Glucose, Bld 80 70 - 99 mg/dL    Comment: Glucose reference range applies only to samples  taken after fasting for at least 8 hours.   BUN 16 6 - 20 mg/dL   Creatinine, Ser 1.82 0.61 - 1.24 mg/dL   Calcium 9.1 8.9 - 99.3 mg/dL   Total Protein 7.4 6.5 - 8.1 g/dL   Albumin 3.5 3.5  - 5.0 g/dL   AST 19 15 - 41 U/L   ALT 21 0 - 44 U/L   Alkaline Phosphatase 63 38 - 126 U/L   Total Bilirubin 0.5 0.3 - 1.2 mg/dL   GFR, Estimated >71 >69 mL/min    Comment: (NOTE) Calculated using the CKD-EPI Creatinine Equation (2021)    Anion gap 6 5 - 15    Comment: Performed at [facility], [address], [place], [place] [address]  Sedimentation rate     Status: Abnormal   Collection Time: 05/21/21 11:59 AM  Result Value Ref Range   Sed Rate 33 (H) 0 - 16 mm/hr    Comment: Performed at [facility], [address] [place], [place] [address]  C-reactive protein     Status: Abnormal   Collection Time: 05/21/21 11:59 AM  Result Value Ref Range   CRP 2.7 (H) <1.0 mg/dL    Comment: Performed at [facility], [address] [place], [place] [address]  Hemoglobin A1c     Status: None   Collection Time: 05/21/21  6:31 PM  Result Value Ref Range   Hgb A1c MFr Bld 5.3 4.8 - 5.6 %    Comment: (NOTE) Pre diabetes:          5.7%-6.4%  Diabetes:              >6.4%  Glycemic control for   <7.0% adults with diabetes    Mean Plasma Glucose 105.41 mg/dL    Comment: Performed at [facility], [address] [place], [place] [address]  HIV Antibody (routine testing w rflx)     Status: None   Collection Time: 05/21/21  6:31 PM  Result Value Ref Range   HIV Screen 4th Generation wRfx Non Reactive Non Reactive    Comment: Performed at [facility], [address] [place] [address], [place], [place] [address]  CBC     Status: Abnormal   Collection Time: 05/21/21  6:31 PM  Result Value Ref Range   WBC 9.4 4.0 - 10.5 K/uL   RBC 4.95 4.22 - 5.81 MIL/uL   Hemoglobin 15.2 13.0 - 17.0 g/dL   HCT 53.6 14.4 - 31.5 %   MCV 91.3 80.0 - 100.0 fL   MCH 30.7 26.0 - 34.0 pg   MCHC 33.6 30.0 - 36.0 g/dL   RDW 40.0 86.7 - 61.9 %   Platelets 148 (L) 150 - 400 K/uL   nRBC 0.0 0.0 - 0.2 %    Comment: Performed at [facility], [address] [place], [place] [address]    DG  Toe Great Left  Result Date: 05/21/2021 CLINICAL DATA:  Toe swelling. EXAM: LEFT GREAT TOE COMPARISON:  None. FINDINGS: Oblique fracture noted mid aspect of the distal phalanx. There is cortical loss along the tuft of the distal phalanx consistent with osteomyelitis, best noted on the oblique film. Prominent soft tissue swelling noted great toe. IMPRESSION: 1. Mildly displaced oblique fracture in the distal phalanx with associated soft tissue swelling. 2. Cortical loss along the tuft of the distal phalanx consistent with osteomyelitis. Electronically Signed   By: Kennith Center M.D.   On: 05/21/2021 12:39  VAS [place] LOWER EXTREMITY VENOUS (DVT) (ONLY [facility] & [facility] 7a-7p)  Result Date: 05/21/2021  Lower Venous DVT Study Patient Name:  Hamilton Hamilton  Date of Exam:   05/21/2021 Medical Rec #: [id number]     Accession #:    [id number] Date of Birth: 1966-04-18    Patient Gender: M Patient Age:   54Y Exam Location:  [facility] Procedure:      VAS [place] LOWER EXTREMITY VENOUS (DVT) Referring Phys: [id number] ANNA G WRIGHT --------------------------------------------------------------------------------  Indications: Swelling, and Ulceration of great toe.  Performing Technologist: Sherren Kerns RVS  Examination Guidelines: A complete evaluation includes B-mode imaging, spectral Doppler, color Doppler, and power Doppler as needed of all accessible portions of each vessel. Bilateral testing is considered an integral part of a complete examination. Limited examinations for reoccurring indications may be performed as noted. The reflux portion of the exam is performed with the patient in reverse Trendelenburg.  +-----+---------------+---------+-----------+----------+--------------+ RIGHTCompressibilityPhasicitySpontaneityPropertiesThrombus Aging +-----+---------------+---------+-----------+----------+--------------+ CFV  Full           Yes      Yes                                  +-----+---------------+---------+-----------+----------+--------------+   +---------+---------------+---------+-----------+----------+--------------+ LEFT     CompressibilityPhasicitySpontaneityPropertiesThrombus Aging +---------+---------------+---------+-----------+----------+--------------+ CFV      Full           Yes      Yes                                 +---------+---------------+---------+-----------+----------+--------------+ SFJ      Full                                                        +---------+---------------+---------+-----------+----------+--------------+ FV Prox  Full                                                        +---------+---------------+---------+-----------+----------+--------------+ FV Mid   Full                                                        +---------+---------------+---------+-----------+----------+--------------+ FV DistalFull                                                        +---------+---------------+---------+-----------+----------+--------------+ PFV      Full                                                        +---------+---------------+---------+-----------+----------+--------------+ POP      Full  Yes      Yes                                 +---------+---------------+---------+-----------+----------+--------------+ PTV      Full                                                        +---------+---------------+---------+-----------+----------+--------------+ PERO     Full                                                        +---------+---------------+---------+-----------+----------+--------------+     Summary: RIGHT: - No evidence of common femoral vein obstruction.  LEFT: - There is no evidence of deep vein thrombosis in the lower extremity.  *See table(s) above for measurements and observations.    Preliminary     Review of Systems Blood pressure 133/71, pulse 78,  temperature 98.2 F (36.8 C), temperature source Oral, resp. rate 18, height 6\' 2"  (1.88 m), weight 130.1 kg, SpO2 99 %. Physical Exam General: Well-developed well-nourished male no acute distress Psych: Alert and oriented x3 Vascular: Calf supple nontender.  Left foot dorsal pedal pulse 2+. Left great toe dressings were not removed.  Dressings are clean and dry.  Photographs from the ER taken earlier today are reviewed and show ulceration in the plantar aspect of the great toe with almost the entire surface denuded skin.  Dorsal aspect of the great toe with areas of eschar in half of the dorsal aspect of the toe denuded of skin.  The toe appears edematous.  Assessment/Plan: Chronic left great toe infection with osteomyelitis. Recommend amputation of the left great toe through the MP joint.  Would recommend proceeding with surgery over the next couple days.  Possibly tomorrow.  Questions were encouraged and answered at length by Dr. and myself.  Postoperative protocol discussed with patient.  Hamilton Hamilton 05/21/2021, 10:43 PM

## 2021-05-21 NOTE — Progress Notes (Signed)
Patient ID: Evan Hamilton, male   DOB: [birth year], 55 y.o.   MRN: [id number] I have reviewed the pictures in epic of the patient's great toe.  Also reviewed the x-rays.  This is certainly an acute on chronic issue.  It is greatly appreciated that he is being admitted to the medical service.  He can eat from an orthopedic standpoint today.  I will see him at some standpoint and make a determination about the appropriate surgery which is likely an amputation of his great toe at a minimum.

## 2021-05-22 ENCOUNTER — Encounter (HOSPITAL_COMMUNITY)
Admission: EM | Disposition: A | Discharge status: Home / Self Care | Payer: Self-pay | Source: Home / Self Care | Attending: Internal Medicine

## 2021-05-22 ENCOUNTER — Inpatient Hospital Stay (HOSPITAL_COMMUNITY): Payer: BC Managed Care – PPO | Admitting: Anesthesiology

## 2021-05-22 ENCOUNTER — Encounter (HOSPITAL_COMMUNITY): Payer: Self-pay | Admitting: Internal Medicine

## 2021-05-22 DIAGNOSIS — M869 Osteomyelitis, unspecified: Secondary | ICD-10-CM

## 2021-05-22 HISTORY — PX: AMPUTATION: SHX166

## 2021-05-22 LAB — BASIC METABOLIC PANEL
Anion gap: 7 (ref 5–15)
BUN: 16 mg/dL (ref 6–20)
CO2: 23 mmol/L (ref 22–32)
Calcium: 8.6 mg/dL — ABNORMAL LOW (ref 8.9–10.3)
Chloride: 106 mmol/L (ref 98–111)
Creatinine, Ser: 1.02 mg/dL (ref 0.61–1.24)
GFR, Estimated: >60 mL/min (ref >60)
Glucose, Bld: 106 mg/dL — ABNORMAL HIGH (ref 70–99)
Potassium: 4.3 mmol/L (ref 3.5–5.1)
Sodium: 136 mmol/L (ref 135–145)

## 2021-05-22 LAB — GLUCOSE, CAPILLARY
Glucose-Capillary: 148 mg/dL — ABNORMAL HIGH (ref 70–99)
Glucose-Capillary: 105 mg/dL — ABNORMAL HIGH (ref 70–99)
Glucose-Capillary: 102 mg/dL — ABNORMAL HIGH (ref 70–99)
Glucose-Capillary: 111 mg/dL — ABNORMAL HIGH (ref 70–99)
Glucose-Capillary: 91 mg/dL (ref 70–99)
Glucose-Capillary: 86 mg/dL (ref 70–99)

## 2021-05-22 LAB — CBC
HCT: 41.9 % (ref 39.0–52.0)
Hemoglobin: 14.3 g/dL (ref 13.0–17.0)
MCH: 31 pg (ref 26.0–34.0)
MCHC: 34.1 g/dL (ref 30.0–36.0)
MCV: 90.7 fL (ref 80.0–100.0)
Platelets: 123 10*3/uL — ABNORMAL LOW (ref 150–400)
RBC: 4.62 MIL/uL (ref 4.22–5.81)
RDW: 12.5 % (ref 11.5–15.5)
WBC: 8.9 10*3/uL (ref 4.0–10.5)
nRBC: 0 % (ref 0.0–0.2)

## 2021-05-22 LAB — SARS CORONAVIRUS 2 (TAT 6-24 HRS): SARS Coronavirus 2: NEGATIVE

## 2021-05-22 LAB — MRSA NEXT GEN BY PCR, NASAL: MRSA by PCR Next Gen: NOT DETECTED

## 2021-05-22 LAB — C-REACTIVE PROTEIN: CRP: 2.3 mg/dL — ABNORMAL HIGH (ref <1.0)

## 2021-05-22 LAB — SEDIMENTATION RATE: Sed Rate: 30 mm/hr — ABNORMAL HIGH (ref 0–16)

## 2021-05-22 SURGERY — AMPUTATION DIGIT
Anesthesia: Monitor Anesthesia Care | Laterality: Left

## 2021-05-22 MED ORDER — ORAL CARE MOUTH RINSE: 15.0000 mL | Freq: Once | OROMUCOSAL | Status: AC

## 2021-05-22 MED ORDER — LIDOCAINE 2% (20 MG/ML) 5 ML SYRINGE
INTRAMUSCULAR | Status: DC | PRN
  Administered 2021-05-22: 60 mg via INTRAVENOUS

## 2021-05-22 MED ORDER — CHLORHEXIDINE GLUCONATE 0.12 % MT SOLN
OROMUCOSAL | Status: AC
  Administered 2021-05-22: 15 mL via OROMUCOSAL
  Filled 2021-05-22: qty 15

## 2021-05-22 MED ORDER — FENTANYL CITRATE (PF) 250 MCG/5ML IJ SOLN
INTRAMUSCULAR | Status: AC
  Filled 2021-05-22: qty 5

## 2021-05-22 MED ORDER — FENTANYL CITRATE (PF) 100 MCG/2ML IJ SOLN
INTRAMUSCULAR | Status: DC | PRN
  Administered 2021-05-22 (×2): 25 ug via INTRAVENOUS

## 2021-05-22 MED ORDER — PROMETHAZINE HCL 25 MG/ML IJ SOLN: 6.2500 mg | INTRAMUSCULAR | Status: DC | PRN

## 2021-05-22 MED ORDER — PROPOFOL 500 MG/50ML IV EMUL
INTRAVENOUS | Status: DC | PRN
  Administered 2021-05-22: 125 ug/kg/min via INTRAVENOUS

## 2021-05-22 MED ORDER — CHLORHEXIDINE GLUCONATE 0.12 % MT SOLN: 15.0000 mL | Freq: Once | OROMUCOSAL | Status: AC

## 2021-05-22 MED ORDER — MIDAZOLAM HCL 2 MG/2ML IJ SOLN
INTRAMUSCULAR | Status: AC
  Filled 2021-05-22: qty 2

## 2021-05-22 MED ORDER — ONDANSETRON HCL 4 MG/2ML IJ SOLN
INTRAMUSCULAR | Status: AC
  Filled 2021-05-22: qty 2

## 2021-05-22 MED ORDER — FENTANYL CITRATE (PF) 100 MCG/2ML IJ SOLN: 25.0000 ug | INTRAMUSCULAR | Status: DC | PRN

## 2021-05-22 MED ORDER — ONDANSETRON HCL 4 MG/2ML IJ SOLN
INTRAMUSCULAR | Status: DC | PRN
  Administered 2021-05-22: 4 mg via INTRAVENOUS

## 2021-05-22 MED ORDER — LACTATED RINGERS IV SOLN: INTRAVENOUS | Status: DC

## 2021-05-22 MED ORDER — LIDOCAINE 2% (20 MG/ML) 5 ML SYRINGE
INTRAMUSCULAR | Status: AC
  Filled 2021-05-22: qty 5

## 2021-05-22 MED ORDER — BUPIVACAINE HCL (PF) 0.25 % IJ SOLN
INTRAMUSCULAR | Status: AC
  Filled 2021-05-22: qty 30

## 2021-05-22 MED ORDER — PROPOFOL 1000 MG/100ML IV EMUL
INTRAVENOUS | Status: AC
  Filled 2021-05-22: qty 100

## 2021-05-22 MED ORDER — MIDAZOLAM HCL 5 MG/5ML IJ SOLN
INTRAMUSCULAR | Status: DC | PRN
  Administered 2021-05-22: 2 mg via INTRAVENOUS

## 2021-05-22 MED ORDER — ACETAMINOPHEN 10 MG/ML IV SOLN: 1000.0000 mg | Freq: Once | INTRAVENOUS | Status: DC | PRN

## 2021-05-22 SURGICAL SUPPLY — 54 items
BAG COUNTER SPONGE SURGICOUNT (BAG) ×2 IMPLANT
BAG SURGICOUNT SPONGE COUNTING (BAG) ×1
BLADE AVERAGE 25MMX9MM (BLADE)
BLADE AVERAGE 25X9 (BLADE) IMPLANT
BLADE MINI RND TIP GREEN BEAV (BLADE) IMPLANT
BNDG COHESIVE 1X5 TAN STRL LF (GAUZE/BANDAGES/DRESSINGS) IMPLANT
BNDG COHESIVE 6X5 TAN STRL LF (GAUZE/BANDAGES/DRESSINGS) IMPLANT
BNDG ESMARK 4X9 LF (GAUZE/BANDAGES/DRESSINGS) ×3 IMPLANT
BNDG GAUZE ELAST 4 BULKY (GAUZE/BANDAGES/DRESSINGS) IMPLANT
BNDG STRETCH 4X75 STRL LF (GAUZE/BANDAGES/DRESSINGS) IMPLANT
CORD BIPOLAR FORCEPS 12FT (ELECTRODE) ×3 IMPLANT
COVER SURGICAL LIGHT HANDLE (MISCELLANEOUS) ×3 IMPLANT
CUFF TOURN SGL QUICK 18X4 (TOURNIQUET CUFF) IMPLANT
CUFF TOURN SGL QUICK 24 (TOURNIQUET CUFF)
CUFF TOURN SGL QUICK 34 (TOURNIQUET CUFF)
CUFF TRNQT CYL 24X4X16.5-23 (TOURNIQUET CUFF) IMPLANT
CUFF TRNQT CYL 34X4.125X (TOURNIQUET CUFF) IMPLANT
DRAPE U-SHAPE 47X51 STRL (DRAPES) ×3 IMPLANT
DRSG XEROFORM 1X8 (GAUZE/BANDAGES/DRESSINGS) ×3 IMPLANT
DURAPREP 26ML APPLICATOR (WOUND CARE) ×3 IMPLANT
ELECT REM PT RETURN 9FT ADLT (ELECTROSURGICAL) ×3
ELECTRODE REM PT RTRN 9FT ADLT (ELECTROSURGICAL) ×1 IMPLANT
GAUZE SPONGE 2X2 8PLY STRL LF (GAUZE/BANDAGES/DRESSINGS) IMPLANT
GAUZE SPONGE 4X4 12PLY STRL (GAUZE/BANDAGES/DRESSINGS) ×3 IMPLANT
GAUZE XEROFORM 1X8 LF (GAUZE/BANDAGES/DRESSINGS) IMPLANT
GLOVE SRG 8 PF TXTR STRL LF DI (GLOVE) ×1 IMPLANT
GLOVE SURG ENC MOIS LTX SZ8 (GLOVE) ×3 IMPLANT
GLOVE SURG ORTHO LTX SZ7.5 (GLOVE) ×3 IMPLANT
GLOVE SURG UNDER POLY LF SZ8 (GLOVE) ×2
GOWN STRL REUS W/ TWL LRG LVL3 (GOWN DISPOSABLE) ×1 IMPLANT
GOWN STRL REUS W/ TWL XL LVL3 (GOWN DISPOSABLE) ×4 IMPLANT
GOWN STRL REUS W/TWL LRG LVL3 (GOWN DISPOSABLE) ×2
GOWN STRL REUS W/TWL XL LVL3 (GOWN DISPOSABLE) ×8
KIT BASIN OR (CUSTOM PROCEDURE TRAY) ×3 IMPLANT
KIT TURNOVER KIT B (KITS) ×3 IMPLANT
MANIFOLD NEPTUNE II (INSTRUMENTS) ×3 IMPLANT
NEEDLE HYPO 25GX1X1/2 BEV (NEEDLE) IMPLANT
NS IRRIG 1000ML POUR BTL (IV SOLUTION) ×3 IMPLANT
PACK ORTHO EXTREMITY (CUSTOM PROCEDURE TRAY) ×3 IMPLANT
PAD ARMBOARD 7.5X6 YLW CONV (MISCELLANEOUS) ×6 IMPLANT
PAD CAST 4YDX4 CTTN HI CHSV (CAST SUPPLIES) ×1 IMPLANT
PADDING CAST COTTON 4X4 STRL (CAST SUPPLIES) ×2
SPECIMEN JAR SMALL (MISCELLANEOUS) ×3 IMPLANT
SPONGE GAUZE 2X2 STER 10/PKG (GAUZE/BANDAGES/DRESSINGS)
SUCTION FRAZIER HANDLE 10FR (MISCELLANEOUS)
SUCTION TUBE FRAZIER 10FR DISP (MISCELLANEOUS) IMPLANT
SUT ETHILON 2 0 FS 18 (SUTURE) IMPLANT
SUT VIC AB 2-0 FS1 27 (SUTURE) IMPLANT
SYR CONTROL 10ML LL (SYRINGE) IMPLANT
TOWEL GREEN STERILE (TOWEL DISPOSABLE) ×3 IMPLANT
TOWEL GREEN STERILE FF (TOWEL DISPOSABLE) ×3 IMPLANT
TUBE CONNECTING 12'X1/4 (SUCTIONS)
TUBE CONNECTING 12X1/4 (SUCTIONS) IMPLANT
WATER STERILE IRR 1000ML POUR (IV SOLUTION) ×3 IMPLANT

## 2021-05-22 NOTE — Progress Notes (Signed)
Patient ID: Evan Hamilton, male   DOB: 1965/12/25, 55 y.o.   MRN: [id number] Understands that he is here today for a left great toe amputation due to significant osteomyelitis in the distal phalanx combined with maceration of the skin.  The amputation level needs to be at the left great toe MTP joint to allow the best chance of soft tissue healing.  This has been explained to him in detail and the risks and benefits of surgery have also been described.

## 2021-05-22 NOTE — Transfer of Care (Signed)
Immediate Anesthesia Transfer of Care Note  Patient: Evan Hamilton  Procedure(s) Performed: GREAT TOE AMPUTATION (Left)  Patient Location: PACU  Anesthesia Type:MAC  Level of Consciousness: awake, alert  and oriented  Airway & Oxygen Therapy: Patient Spontanous Breathing  Post-op Assessment: Report given to RN and Post -op Vital signs reviewed and stable  Post vital signs: Reviewed and stable  Last Vitals:  Vitals Value Taken Time  BP 99/71 05/22/21 1044  Temp    Pulse 69 05/22/21 1046  Resp 14 05/22/21 1046  SpO2 98 % 05/22/21 1046  Vitals shown include unvalidated device data.  Last Pain:  Vitals:   05/22/21 0902  TempSrc: Oral  PainSc: 0-No pain      Patients Stated Pain Goal: 3 (72/25/75 0518)  Complications: No notable events documented.

## 2021-05-22 NOTE — Anesthesia Postprocedure Evaluation (Signed)
Anesthesia Post Note  Patient: Evan Hamilton  Procedure(s) Performed: GREAT TOE AMPUTATION (Left)     Patient location during evaluation: PACU Anesthesia Type: MAC Level of consciousness: awake and alert Pain management: pain level controlled Vital Signs Assessment: post-procedure vital signs reviewed and stable Respiratory status: spontaneous breathing, nonlabored ventilation, respiratory function stable and patient connected to nasal cannula oxygen Cardiovascular status: stable and blood pressure returned to baseline Postop Assessment: no apparent nausea or vomiting Anesthetic complications: no   No notable events documented.  Last Vitals:  Vitals:   05/22/21 1130 05/22/21 1142  BP: 106/71 103/66  Pulse: 68 (!) 59  Resp: 11 18  Temp:  (!) 36.4 C  SpO2: 98% 98%    Last Pain:  Vitals:   05/22/21 1142  TempSrc: Oral  PainSc:                  [name] Aalaiyah Yassin

## 2021-05-22 NOTE — Progress Notes (Signed)
PROGRESS NOTE    Evan PartyMark Hamilton  ZOX:[id number] DOB: [birth year] DOA: 05/21/2021 PCP: Patient, No Pcp Per (Inactive)   Brief Narrative: 55 year old with past medical history significant for previous history diabetes type 2, resolved with 110 pound weight loss, he developed a blister 374-month prior to admission the left first toe after hiking.  The blister subsequently ulcerated.  He has been under the care of podiatrist however they also has never healed.  [place], 2 days after he developed significant swelling and redness of his first toe.  Patient was evaluated by his podiatrist who, he was a started on oral Bactrim.  4 days ago he noticed swelling of his ankle going to his left.  He was referred by his podiatrist to the ED.  He presented with leukocytosis, x-ray showed fracture of the left distal phalanx of first toe along with changes consistent with osteomyelitis.  DVT was ruled out.  Orthopedic has been consulted, who is recommending an amputation of the left great toe to the MTP joint.    Assessment & Plan:   Active Problems:   Osteomyelitis of ankle or foot, acute, left (HCC)   Open fracture of left great toe   Osteomyelitis of great toe of left foot (HCC)  1-Left First Toe infection, osteomyelitis. Cellulitis;  -Dr Magnus IvanBlackman consulted. Patient needs amputation of left great toe to MTP joint.  -Continue with Vancomycin, cefepime, Flagyl.  -Patient received Tdap.  -underwent left great toe amputation 7/03.  2-History of DM.  Patient had 110 pound weight loss. His HbA1c has been around 5.2. Recheck Hb-A1c; 5.3  3-Chronic allergies;  Continue with loratadine.   Mild Thrombocytopenia; likely related to infection.      Estimated body mass index is 36.83 kg/m as calculated from the following:   Height as of this encounter: 6\' 2"  (1.88 m).   Weight as of this encounter: 130.1 kg.   DVT prophylaxis: Lovenox Code Status: Full  Code Family Communication: care discussed with patient.  Disposition Plan:  Status is: Inpatient  Remains inpatient appropriate because:IV treatments appropriate due to intensity of illness or inability to take PO  Dispo: The patient is from: Home              Anticipated d/c is to: Home              Patient currently is not medically stable to d/c.   Difficult to place patient No        Consultants:  Dr Magnus IvanBlackman.   Procedures:  Left Great toe amputation.   Antimicrobials:    Subjective: He had surgery today. Denies pain.  Had some diarrhea last night.  Denies abdominal pain   Objective: Vitals:   05/21/21 1812 05/21/21 2200 05/22/21 0433 05/22/21 0734  BP: 115/72 133/71 114/71 100/68  Pulse: 68 78 72 69  Resp: 16 18 18 18   Temp: 98.9 F (37.2 C) 98.2 F (36.8 C) 98.4 F (36.9 C) 98.5 F (36.9 C)  TempSrc: Oral Oral Oral Oral  SpO2: 100% 99% 96% 98%  Weight:      Height:        Intake/Output Summary (Last 24 hours) at 05/22/2021 0736 Last data filed at 05/22/2021 [id number] Gross per 24 hour  Intake 990 ml  Output --  Net 990 ml   Filed Weights   05/21/21 1507  Weight: 130.1 kg    Examination:  General exam: Appears calm and comfortable  Respiratory system: Clear  to auscultation. Respiratory effort normal. Cardiovascular system: S1 & S2 heard, RRR. No JVD, murmurs, rubs, gallops or clicks. No pedal edema. Gastrointestinal system: Abdomen is nondistended, soft and nontender. No organomegaly or masses felt. Normal bowel sounds heard. Central nervous system: Alert and oriented. No focal neurological deficits. Extremities: left Foot with dressing.    Data Reviewed: I have personally reviewed following labs and imaging studies  CBC: Recent Labs  Lab 05/21/21 0846 05/21/21 1159 05/21/21 1831 05/22/21 0603  WBC 12.7* 12.0* 9.4 8.9  NEUTROABS 9.2* 8.5*  --   --   HGB 15.1 16.1 15.2 14.3  HCT 46.4 49.0 45.2 41.9  MCV 93.0 93.2 91.3 90.7  PLT 153 149*  148* 123*   Basic Metabolic Panel: Recent Labs  Lab 05/21/21 0846 05/21/21 1159 05/22/21 0603  NA 138 138 136  K 4.0 4.3 4.3  CL 107 105 106  CO2 26 27 23   GLUCOSE 164* 80 106*  BUN 17 16 16   CREATININE 0.97 0.98 1.02  CALCIUM 9.0 9.1 8.6*   GFR: Estimated Creatinine Clearance: 120.1 mL/min (by C-G formula based on SCr of 1.02 mg/dL). Liver Function Tests: Recent Labs  Lab 05/21/21 0846 05/21/21 1159  AST 21 19  ALT 22 21  ALKPHOS 63 63  BILITOT 0.5 0.5  PROT 7.2 7.4  ALBUMIN 3.4* 3.5   No results for input(s): LIPASE, AMYLASE in the last 168 hours. No results for input(s): AMMONIA in the last 168 hours. Coagulation Profile: No results for input(s): INR, PROTIME in the last 168 hours. Cardiac Enzymes: No results for input(s): CKTOTAL, CKMB, CKMBINDEX, TROPONINI in the last 168 hours. BNP (last 3 results) No results for input(s): PROBNP in the last 8760 hours. HbA1C: Recent Labs    05/21/21 1831  HGBA1C 5.3   CBG: Recent Labs  Lab 05/21/21 2215 05/22/21 0547  GLUCAP 148* 105*   Lipid Profile: No results for input(s): CHOL, HDL, LDLCALC, TRIG, CHOLHDL, LDLDIRECT in the last 72 hours. Thyroid Function Tests: No results for input(s): TSH, T4TOTAL, FREET4, T3FREE, THYROIDAB in the last 72 hours. Anemia Panel: No results for input(s): VITAMINB12, FOLATE, FERRITIN, TIBC, IRON, RETICCTPCT in the last 72 hours. Sepsis Labs: Recent Labs  Lab 05/21/21 1046  LATICACIDVEN 1.9    Recent Results (from the past 240 hour(s))  SARS CORONAVIRUS 2 (TAT 6-24 HRS) Nasopharyngeal Nasopharyngeal Swab     Status: None   Collection Time: 05/21/21  1:19 PM   Specimen: Nasopharyngeal Swab  Result Value Ref Range Status   SARS Coronavirus 2 NEGATIVE NEGATIVE Final    Comment: (NOTE) SARS-CoV-2 target nucleic acids are NOT DETECTED.  The SARS-CoV-2 RNA is generally detectable in upper and lower respiratory specimens during the acute phase of infection. Negative results  do not preclude SARS-CoV-2 infection, do not rule out co-infections with other pathogens, and should not be used as the sole basis for treatment or other patient management decisions. Negative results must be combined with clinical observations, patient history, and epidemiological information. The expected result is Negative.  Fact Sheet for Patients: 07/22/21  Fact Sheet for Healthcare Providers: 07/22/21  This test is not yet approved or cleared by the [employer] FDA and  has been authorized for detection and/or diagnosis of SARS-CoV-2 by FDA under an Emergency Use Authorization (EUA). This EUA will remain  in effect (meaning this test can be used) for the duration of the COVID-19 declaration under Se ction 564(b)(1) of the Act, 21 U.S.C. section 360bbb-3(b)(1), unless the authorization is  terminated or revoked sooner.  Performed at [facility], [address] [place], [place] [address]   MRSA Next Gen by PCR, Nasal     Status: None   Collection Time: 05/21/21 11:07 PM   Specimen: Nasal Mucosa; Nasal Swab  Result Value Ref Range Status   MRSA by PCR Next Gen NOT DETECTED NOT DETECTED Final    Comment: (NOTE) The GeneXpert MRSA Assay (FDA approved for NASAL specimens only), is one component of a comprehensive MRSA colonization surveillance program. It is not intended to diagnose MRSA infection nor to guide or monitor treatment for MRSA infections. Test performance is not FDA approved in patients less than 54 years old. Performed at [facility], [address] [place], [place] [address]          Radiology Studies: DG Toe Great Left  Result Date: 05/21/2021 CLINICAL DATA:  Toe swelling. EXAM: LEFT GREAT TOE COMPARISON:  None. FINDINGS: Oblique fracture noted mid aspect of the distal phalanx. There is cortical loss along the tuft of the distal phalanx consistent with osteomyelitis, best  noted on the oblique film. Prominent soft tissue swelling noted great toe. IMPRESSION: 1. Mildly displaced oblique fracture in the distal phalanx with associated soft tissue swelling. 2. Cortical loss along the tuft of the distal phalanx consistent with osteomyelitis. Electronically Signed   By: Kennith Center M.D.   On: 05/21/2021 12:39   VAS [place] LOWER EXTREMITY VENOUS (DVT) (ONLY [facility] 7a-7p)  Result Date: 05/21/2021  Lower Venous DVT Study Patient Name:  Evan Hamilton  Date of Exam:   05/21/2021 Medical Rec #: [id number]     Accession #:    [id number] Date of Birth: 1966/04/16    Patient Gender: M Patient Age:   54Y Exam Location:  [facility] Procedure:      VAS [place] LOWER EXTREMITY VENOUS (DVT) Referring Phys: [id number] ANNA G WRIGHT --------------------------------------------------------------------------------  Indications: Swelling, and Ulceration of great toe.  Performing Technologist: Sherren Kerns RVS  Examination Guidelines: A complete evaluation includes B-mode imaging, spectral Doppler, color Doppler, and power Doppler as needed of all accessible portions of each vessel. Bilateral testing is considered an integral part of a complete examination. Limited examinations for reoccurring indications may be performed as noted. The reflux portion of the exam is performed with the patient in reverse Trendelenburg.  +-----+---------------+---------+-----------+----------+--------------+ RIGHTCompressibilityPhasicitySpontaneityPropertiesThrombus Aging +-----+---------------+---------+-----------+----------+--------------+ CFV  Full           Yes      Yes                                 +-----+---------------+---------+-----------+----------+--------------+   +---------+---------------+---------+-----------+----------+--------------+ LEFT     CompressibilityPhasicitySpontaneityPropertiesThrombus Aging +---------+---------------+---------+-----------+----------+--------------+ CFV       Full           Yes      Yes                                 +---------+---------------+---------+-----------+----------+--------------+ SFJ      Full                                                        +---------+---------------+---------+-----------+----------+--------------+ FV Prox  Full                                                        +---------+---------------+---------+-----------+----------+--------------+  FV Mid   Full                                                        +---------+---------------+---------+-----------+----------+--------------+ FV DistalFull                                                        +---------+---------------+---------+-----------+----------+--------------+ PFV      Full                                                        +---------+---------------+---------+-----------+----------+--------------+ POP      Full           Yes      Yes                                 +---------+---------------+---------+-----------+----------+--------------+ PTV      Full                                                        +---------+---------------+---------+-----------+----------+--------------+ PERO     Full                                                        +---------+---------------+---------+-----------+----------+--------------+     Summary: RIGHT: - No evidence of common femoral vein obstruction.  LEFT: - There is no evidence of deep vein thrombosis in the lower extremity.  *See table(s) above for measurements and observations.    Preliminary         Scheduled Meds:  enoxaparin (LOVENOX) injection  65 mg Subcutaneous Q24H   insulin aspart  0-5 Units Subcutaneous QHS   insulin aspart  0-9 Units Subcutaneous TID WC   loratadine  10 mg Oral Daily   Continuous Infusions:  ceFEPime (MAXIPIME) IV 2 g (05/22/21 0528)   metronidazole 500 mg (05/22/21 0629)   vancomycin 1,250 mg (05/22/21 0346)      LOS: 1 day    Time spent: 35 minutes.     Alba Cory, MD [facility]   If 7PM-7AM, please contact night-coverage [email or web address]  05/22/2021, 7:36 AM

## 2021-05-22 NOTE — Brief Op Note (Signed)
05/22/2021  10:48 AM  PATIENT:  Evan Hamilton  55 y.o. male  PRE-OPERATIVE DIAGNOSIS:  OSTEOMYELITIS, LEFT GREAT TOE  POST-OPERATIVE DIAGNOSIS:  OSTEOMYELITIS, LEFT GREAT TOE  PROCEDURE:  Procedure(s): GREAT TOE AMPUTATION (Left)  SURGEON:  Surgeon(s) and Role:    * Mcarthur Rossetti, MD - Primary  PHYSICIAN ASSISTANT:  Benita Stabile, PA-C  ANESTHESIA:   MAC  COUNTS:  YES  DICTATION: .Other Dictation: Dictation Number [id number]  PLAN OF CARE: Admit to inpatient   PATIENT DISPOSITION:  PACU - hemodynamically stable.   Delay start of Pharmacological VTE agent (>24hrs) due to surgical blood loss or risk of bleeding: not applicable

## 2021-05-22 NOTE — Anesthesia Preprocedure Evaluation (Signed)
Anesthesia Evaluation  Patient identified by MRN, date of birth, ID band Patient awake    Reviewed: Allergy & Precautions, NPO status , Patient's Chart, lab work & pertinent test results  Airway Mallampati: III  TM Distance: >3 FB Neck ROM: Full    Dental  (+) Teeth Intact   Pulmonary neg pulmonary ROS, Current Smoker,    Pulmonary exam normal        Cardiovascular negative cardio ROS   Rhythm:Regular Rate:Normal     Neuro/Psych Anxiety Depression negative neurological ROS     GI/Hepatic negative GI ROS, Neg liver ROS,   Endo/Other  diabetes, Poorly Controlled, Type 2  Renal/GU negative Renal ROS  negative genitourinary   Musculoskeletal Left toe osteomyelitis    Abdominal (+)  Abdomen: soft. Bowel sounds: normal.  Peds  Hematology negative hematology ROS (+)   Anesthesia Other Findings   Reproductive/Obstetrics                           Anesthesia Physical Anesthesia Plan  ASA: 3  Anesthesia Plan: MAC   Post-op Pain Management:    Induction: Intravenous  PONV Risk Score and Plan: 1 and Propofol infusion, Midazolam and Ondansetron  Airway Management Planned: Simple Face Mask, Natural Airway and Nasal Cannula  Additional Equipment: None  Intra-op Plan:   Post-operative Plan:   Informed Consent: I have reviewed the patients History and Physical, chart, labs and discussed the procedure including the risks, benefits and alternatives for the proposed anesthesia with the patient or authorized representative who has indicated his/her understanding and acceptance.     Dental advisory given  Plan Discussed with: CRNA  Anesthesia Plan Comments: (Lab Results      Component                Value               Date                      WBC                      8.9                 05/22/2021                HGB                      14.3                05/22/2021                HCT                       41.9                05/22/2021                MCV                      90.7                05/22/2021                PLT                      123 (L)  05/22/2021           Lab Results      Component                Value               Date                      NA                       136                 05/22/2021                K                        4.3                 05/22/2021                CO2                      23                  05/22/2021                GLUCOSE                  106 (H)             05/22/2021                BUN                      16                  05/22/2021                CREATININE               1.02                05/22/2021                CALCIUM                  8.6 (L)             05/22/2021                GFRNONAA                 >60                 05/22/2021                GFRAA                    57 (L)              11/27/2016          )        Anesthesia Quick Evaluation

## 2021-05-22 NOTE — Op Note (Signed)
Evan Hamilton, Evan Hamilton MEDICAL RECORD NO: [id number] ACCOUNT NO: [id number] DATE OF BIRTH: 08/08/1966 FACILITY: [facility] LOCATION: [facility] PHYSICIAN: Vanita Panda. Magnus Ivan, MD  Operative Report   DATE OF PROCEDURE: 05/22/2021  PREOPERATIVE DIAGNOSIS:  Left great toe infection with osteomyelitis.  POSTOPERATIVE DIAGNOSIS:  Left great toe infection with osteomyelitis.  PROCEDURE:  Left great toe amputation through the MTP joint.  SURGEON:  Vanita Panda. Magnus Ivan, MD  ASSISTANT:  Richardean Canal, PA-C.  ANESTHESIA:  Mask ventilation, IV sedation.  ESTIMATED BLOOD LOSS:  Minimal.  COMPLICATIONS:  None.  INDICATIONS:  The patient is a very pleasant 55 year old diabetic who is actually under really good diabetic control now.  He used to be under poor diabetic control, but now his diabetes has improved significantly.  He has been dealing with a chronic  wound at the tip of his great toe on the left side for a couple of months now.  He has been seen by a local podiatrist.  He presented to the Emergency Room yesterday with foot swelling and was noted to have an infection of the great toe.  The x-ray  showed obvious osteomyelitis of the distal phalanx.  The skin was quite macerated and with him having neuropathy his microcirculation is limited.  He has good pulse in his foot, but we have recommended amputation through the MTP joint to allow the best  chance at healing.  I described this to him in detail.  We talked about the risks and benefits of surgery and he does understand wanting to proceed with this surgery based on what we are seeing radiographically and clinically.  DESCRIPTION OF PROCEDURE:  After informed consent was obtained, appropriate left foot was marked.  He was brought to the operating room and placed supine on the operating table.  Mask ventilation, IV sedation was obtained, the left foot was prepped and  draped with Betadine scrub and paint.  Timeout was called and he is a identified  as correct patient, correct left foot.  We then placed a towel around the ankle and used the Esmarch as a local tourniquet.  We then made an incision around the great toe at  the level of the MTP joint.  We are able to remove the great toe easily in its entirety.  We then removed the Esmarch and got good bleeding tissue.  Hemostasis was obtained with electrocautery.  We irrigated the wound with normal saline solution and  then reapproximated flap from posterior to anterior to provide padding at the first MTP joint.  This was closed with interrupted 2-0 nylon suture.  Xeroform well-padded sterile dressing was applied.  He was taken to recovery room in stable condition with  all final counts being correct.  No complications noted.  Postoperatively, we will allow him to weightbear as tolerated through his heel with a Darco shoe.  He can likely be discharged tomorrow on just a course of oral antibiotics.   PUS D: 05/22/2021 10:39:37 am T: 05/22/2021 6:24:00 pm  JOB: [id number]/ [id number]

## 2021-05-22 NOTE — Anesthesia Procedure Notes (Signed)
Procedure Name: MAC Date/Time: 05/22/2021 9:47 AM Performed by: Alain Marion, CRNA Pre-anesthesia Checklist: Patient identified, Emergency Drugs available, Suction available and Patient being monitored Patient Re-evaluated:Patient Re-evaluated prior to induction Oxygen Delivery Method: Simple face mask Placement Confirmation: positive ETCO2

## 2021-05-23 ENCOUNTER — Encounter (HOSPITAL_COMMUNITY): Payer: Self-pay | Admitting: Orthopaedic Surgery

## 2021-05-23 LAB — GLUCOSE, CAPILLARY
Glucose-Capillary: 92 mg/dL (ref 70–99)
Glucose-Capillary: 111 mg/dL — ABNORMAL HIGH (ref 70–99)

## 2021-05-23 MED ORDER — CIPROFLOXACIN HCL 500 MG PO TABS: 500.0000 mg | ORAL_TABLET | Freq: Two times a day (BID) | ORAL | 0 refills | Status: AC

## 2021-05-23 MED ORDER — SULFAMETHOXAZOLE-TRIMETHOPRIM 800-160 MG PO TABS: 1.0000 | ORAL_TABLET | Freq: Two times a day (BID) | ORAL | 0 refills | Status: DC

## 2021-05-23 MED ORDER — CIPROFLOXACIN HCL 500 MG PO TABS: 500.0000 mg | ORAL_TABLET | Freq: Two times a day (BID) | ORAL | 0 refills | Status: DC

## 2021-05-23 NOTE — Progress Notes (Signed)
Orthopedic Tech Progress Note Patient Details:  Evan Hamilton 1966/06/28 [id number]  Ortho Devices Type of Ortho Device: Darco shoe Ortho Device/Splint Location: lle Ortho Device/Splint Interventions: Ordered, Application, Adjustment   Post Interventions Patient Tolerated: Well Instructions Provided: Care of device, Adjustment of device  [name] Post 05/23/2021, 4:38 AM

## 2021-05-23 NOTE — Progress Notes (Signed)
Subjective: 1 Day Post-Op Procedure(s) (LRB): GREAT TOE AMPUTATION (Left) Patient reports pain as mild.  No complaints.   Objective: Vital signs in last 24 hours: Temp:  [97.5 F (36.4 C)-98.9 F (37.2 C)] 98.2 F (36.8 C) (07/04 0424) Pulse Rate:  [59-73] 72 (07/04 0424) Resp:  [11-19] 18 (07/04 0424) BP: (92-108)/(53-73) 108/73 (07/04 0424) SpO2:  [93 %-98 %] 94 % (07/04 0424)  Intake/Output from previous day: 07/03 0701 - 07/04 0700 In: 420 [P.O.:120; I.V.:300] Out: 1555 [Urine:1550; Blood:5] Intake/Output this shift: No intake/output data recorded.  Recent Labs    05/21/21 0846 05/21/21 1159 05/21/21 1831 05/22/21 0603  HGB 15.1 16.1 15.2 14.3   Recent Labs    05/21/21 1831 05/22/21 0603  WBC 9.4 8.9  RBC 4.95 4.62  HCT 45.2 41.9  PLT 148* 123*   Recent Labs    05/21/21 1159 05/22/21 0603  NA 138 136  K 4.3 4.3  CL 105 106  CO2 27 23  BUN 16 16  CREATININE 0.98 1.02  GLUCOSE 80 106*  CALCIUM 9.1 8.6*   No results for input(s): LABPT, INR in the last 72 hours.  Left foot: Incision: dressing C/D/I   Assessment/Plan: 1 Day Post-Op Procedure(s) (LRB): GREAT TOE AMPUTATION (Left) Patient appropriate for discharge from ortho standpoint.  Needs follow up with Dr. Magnus Ivan in 2 weeks.       Kaci Freel 05/23/2021, 11:27 AM

## 2021-05-23 NOTE — Progress Notes (Signed)
Pharmacy Antibiotic Note  Evan Hamilton is a 55 y.o. male admitted on 05/21/2021 with  Toe infection / osteomyelitis .    S/p toe amputation   Patient has penicillin allergy noted but has tolerated cephalosporins in the past.  Plan: Cefepime 2g q8h Vancomycin 2500mg  once then vancomycin 1250 mg q12h Do antibiotics need to continue post surgery / What is LOT?  Height: 6\' 2"  (188 cm) Weight: 130.1 kg (286 lb 13.1 oz) IBW/kg (Calculated) : 82.2  Temp (24hrs), Avg:98.1 F (36.7 C), Min:97.5 F (36.4 C), Max:98.9 F (37.2 C)  Recent Labs  Lab 05/21/21 0846 05/21/21 1046 05/21/21 1159 05/21/21 1831 05/22/21 0603  WBC 12.7*  --  12.0* 9.4 8.9  CREATININE 0.97  --  0.98  --  1.02  LATICACIDVEN  --  1.9  --   --   --      Estimated Creatinine Clearance: 120.1 mL/min (by C-G formula based on SCr of 1.02 mg/dL).    Allergies  Allergen Reactions   Penicillins Hives and Rash    Tolerates Cephalosporins    Thank you  07/22/21, PharmD  05/23/2021 7:46 AM

## 2021-05-23 NOTE — Discharge Instructions (Addendum)
Keep left foot dressing clean dry and intact for one week . Then can remove dressing and shower. Dry foot completely and apply clean dressing to wound.  Weight bear as tolerated through heel with a Darco shoe on left foot.

## 2021-05-23 NOTE — Discharge Summary (Signed)
Physician Discharge Summary  Aneesh Faller ZJI:[id number] DOB: [birth year] DOA: 05/21/2021  PCP: Patient, No Pcp Per (Inactive)  Admit date: 05/21/2021 Discharge date: 05/23/2021  Admitted From: Home  Disposition:  Home   Recommendations for Outpatient Follow-up:  Follow up with PCP in 1-2 weeks Please obtain BMP/CBC in one week Needs to follow up with Ortho in 2 weeks.     Discharge Condition: Stable.  CODE STATUS:Full code Diet recommendation: Heart Healthy  Brief/Interim Summary: 55 year old with past medical history significant for previous history diabetes type 2, resolved with 110 pound weight loss, he developed a blister 87-month prior to admission the left first toe after hiking.  The blister subsequently ulcerated.  He has been under the care of podiatrist however they also has never healed.  [place], 2 days after he developed significant swelling and redness of his first toe.  Patient was evaluated by his podiatrist who, he was a started on oral Bactrim.  4 days ago he noticed swelling of his ankle going to his left.  He was referred by his podiatrist to the ED.   He presented with leukocytosis, x-ray showed fracture of the left distal phalanx of first toe along with changes consistent with osteomyelitis.  DVT was ruled out.  Orthopedic has been consulted, who is recommending an amputation of the left great toe to the MTP joint.    1-Left First Toe infection, osteomyelitis Cellulitis:  -Dr Magnus Ivan consulted. Patient needs amputation of left great toe to MTP joint. -Treated  with Vancomycin, cefepime, Flagyl.  -Patient received Tdap.  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 Plan to discharge on oral antibiotics for 14 days, cipro and Bactrim.   2-History of DM. Patient had 110 pound weight loss. His HbA1c has been around 5.2. Hb-A1c; 5.3 Patient does not have Diabetes.   3-Chronic allergies; Continue with loratadine.    Mild  Thrombocytopenia; likely related to infection.         Discharge Diagnoses:  Active Problems:   Osteomyelitis of ankle or foot, acute, left (HCC)   Open fracture of left great toe   Osteomyelitis of great toe of left foot [facility])    Discharge Instructions  Discharge Instructions     Diet - low sodium heart healthy   Complete by: As directed    Discharge wound care:   Complete by: As directed    See discharge instruction   Increase activity slowly   Complete by: As directed       Allergies as of 05/23/2021       Reactions   Penicillins Hives, Rash   Tolerates Cephalosporins        Medication List     STOP taking these medications    doxycycline 100 MG EC tablet Commonly known as: DORYX   GNP GINGKO BILOBA EXTRACT PO   Santyl ointment Generic drug: collagenase       TAKE these medications    ciprofloxacin 500 MG tablet Commonly known as: Cipro Take 1 tablet (500 mg total) by mouth 2 (two) times daily for 10 days.   loratadine 10 MG tablet Commonly known as: CLARITIN Take 10 mg by mouth every morning.   multivitamin with minerals Tabs tablet Take 1 tablet by mouth every morning.   sulfamethoxazole-trimethoprim 800-160 MG tablet Commonly known as: BACTRIM DS Take 1 tablet by mouth 2 (two) times daily.   TYLENOL PO Take 3 tablets by mouth daily as needed (headache).  Discharge Care Instructions  (From admission, onward)           Start     Ordered   05/23/21 0000  Discharge wound care:       Comments: See discharge instruction   05/23/21 1144            Follow-up Information     Kathryne Hitch, MD Follow up in 2 week(s).   Specialty: Orthopedic Surgery Contact information: [address] [place] [address] [phone]                Allergies  Allergen Reactions   Penicillins Hives and Rash    Tolerates Cephalosporins    Consultations: Ortho  Procedures/Studies: DG Toe Great  Left  Result Date: 05/21/2021 CLINICAL DATA:  Toe swelling. EXAM: LEFT GREAT TOE COMPARISON:  None. FINDINGS: Oblique fracture noted mid aspect of the distal phalanx. There is cortical loss along the tuft of the distal phalanx consistent with osteomyelitis, best noted on the oblique film. Prominent soft tissue swelling noted great toe. IMPRESSION: 1. Mildly displaced oblique fracture in the distal phalanx with associated soft tissue swelling. 2. Cortical loss along the tuft of the distal phalanx consistent with osteomyelitis. Electronically Signed   By: Kennith Center M.D.   On: 05/21/2021 12:39   VAS [place] LOWER EXTREMITY VENOUS (DVT) (ONLY [facility] & [facility] 7a-7p)  Result Date: 05/22/2021  Lower Venous DVT Study Patient Name:  Evan Hamilton  Date of Exam:   05/21/2021 Medical Rec #: [id number]     Accession #:    [id number] Date of Birth: June 01, 1966    Patient Gender: M Patient Age:   55Y Exam Location:  [facility] Procedure:      VAS [place] LOWER EXTREMITY VENOUS (DVT) Referring Phys: [id number] ANNA G WRIGHT --------------------------------------------------------------------------------  Indications: Swelling, and Ulceration of great toe.  Performing Technologist: Sherren Kerns RVS  Examination Guidelines: A complete evaluation includes B-mode imaging, spectral Doppler, color Doppler, and power Doppler as needed of all accessible portions of each vessel. Bilateral testing is considered an integral part of a complete examination. Limited examinations for reoccurring indications may be performed as noted. The reflux portion of the exam is performed with the patient in reverse Trendelenburg.  +-----+---------------+---------+-----------+----------+--------------+ RIGHTCompressibilityPhasicitySpontaneityPropertiesThrombus Aging +-----+---------------+---------+-----------+----------+--------------+ CFV  Full           Yes      Yes                                  +-----+---------------+---------+-----------+----------+--------------+   +---------+---------------+---------+-----------+----------+--------------+ LEFT     CompressibilityPhasicitySpontaneityPropertiesThrombus Aging +---------+---------------+---------+-----------+----------+--------------+ CFV      Full           Yes      Yes                                 +---------+---------------+---------+-----------+----------+--------------+ SFJ      Full                                                        +---------+---------------+---------+-----------+----------+--------------+ FV Prox  Full                                                        +---------+---------------+---------+-----------+----------+--------------+  FV Mid   Full                                                        +---------+---------------+---------+-----------+----------+--------------+ FV DistalFull                                                        +---------+---------------+---------+-----------+----------+--------------+ PFV      Full                                                        +---------+---------------+---------+-----------+----------+--------------+ POP      Full           Yes      Yes                                 +---------+---------------+---------+-----------+----------+--------------+ PTV      Full                                                        +---------+---------------+---------+-----------+----------+--------------+ PERO     Full                                                        +---------+---------------+---------+-----------+----------+--------------+     Summary: RIGHT: - No evidence of common femoral vein obstruction.  LEFT: - There is no evidence of deep vein thrombosis in the lower extremity.  *See table(s) above for measurements and observations. Electronically signed by Fabienne Bruns MD on 05/22/2021 at 8:28:43 AM.     Final      Subjective: He report pain is controlled.   Discharge Exam: Vitals:   05/23/21 0424 05/23/21 1212  BP: 108/73 113/65  Pulse: 72 66  Resp: 18 16  Temp: 98.2 F (36.8 C) 98.9 F (37.2 C)  SpO2: 94% 98%     General: Pt is alert, awake, not in acute distress Cardiovascular: RRR, S1/S2 +, no rubs, no gallops Respiratory: CTA bilaterally, no wheezing, no rhonchi Abdominal: Soft, NT, ND, bowel sounds + Extremities: no edema, no cyanosis    The results of significant diagnostics from this hospitalization (including imaging, microbiology, ancillary and laboratory) are listed below for reference.     Microbiology: Recent Results (from the past 240 hour(s))  SARS CORONAVIRUS 2 (TAT 6-24 HRS) Nasopharyngeal Nasopharyngeal Swab     Status: None   Collection Time: 05/21/21  1:19 PM   Specimen: Nasopharyngeal Swab  Result Value Ref Range Status   SARS Coronavirus 2 NEGATIVE NEGATIVE Final    Comment: (NOTE) SARS-CoV-2 target nucleic acids are NOT DETECTED.  The SARS-CoV-2 RNA is generally  detectable in upper and lower respiratory specimens during the acute phase of infection. Negative results do not preclude SARS-CoV-2 infection, do not rule out co-infections with other pathogens, and should not be used as the sole basis for treatment or other patient management decisions. Negative results must be combined with clinical observations, patient history, and epidemiological information. The expected result is Negative.  Fact Sheet for Patients: HairSlick.no  Fact Sheet for Healthcare Providers: quierodirigir.com  This test is not yet approved or cleared by the [place] FDA and  has been authorized for detection and/or diagnosis of SARS-CoV-2 by FDA under an Emergency Use Authorization (EUA). This EUA will remain  in effect (meaning this test can be used) for the duration of the COVID-19 declaration under Se  ction 564(b)(1) of the Act, 21 U.S.C. section 360bbb-3(b)(1), unless the authorization is terminated or revoked sooner.  Performed at [facility], [address] [place], [place] [address]   MRSA Next Gen by PCR, Nasal     Status: None   Collection Time: 05/21/21 11:07 PM   Specimen: Nasal Mucosa; Nasal Swab  Result Value Ref Range Status   MRSA by PCR Next Gen NOT DETECTED NOT DETECTED Final    Comment: (NOTE) The GeneXpert MRSA Assay (FDA approved for NASAL specimens only), is one component of a comprehensive MRSA colonization surveillance program. It is not intended to diagnose MRSA infection nor to guide or monitor treatment for MRSA infections. Test performance is not FDA approved in patients less than 48 years old. Performed at [facility], [address] [place], [place] [address]      Labs: BNP (last 3 results) No results for input(s): BNP in the last 8760 hours. Basic Metabolic Panel: Recent Labs  Lab 05/21/21 0846 05/21/21 1159 05/22/21 0603  NA 138 138 136  K 4.0 4.3 4.3  CL 107 105 106  CO2 26 27 23   GLUCOSE 164* 80 106*  BUN 17 16 16   CREATININE 0.97 0.98 1.02  CALCIUM 9.0 9.1 8.6*   Liver Function Tests: Recent Labs  Lab 05/21/21 0846 05/21/21 1159  AST 21 19  ALT 22 21  ALKPHOS 63 63  BILITOT 0.5 0.5  PROT 7.2 7.4  ALBUMIN 3.4* 3.5   No results for input(s): LIPASE, AMYLASE in the last 168 hours. No results for input(s): AMMONIA in the last 168 hours. CBC: Recent Labs  Lab 05/21/21 0846 05/21/21 1159 05/21/21 1831 05/22/21 0603  WBC 12.7* 12.0* 9.4 8.9  NEUTROABS 9.2* 8.5*  --   --   HGB 15.1 16.1 15.2 14.3  HCT 46.4 49.0 45.2 41.9  MCV 93.0 93.2 91.3 90.7  PLT 153 149* 148* 123*   Cardiac Enzymes: No results for input(s): CKTOTAL, CKMB, CKMBINDEX, TROPONINI in the last 168 hours. BNP: Invalid input(s): POCBNP CBG: Recent Labs  Lab 05/22/21 1052 05/22/21 1607 05/22/21 2057 05/23/21 0602 05/23/21 1110   GLUCAP 111* 91 86 92 111*   D-Dimer No results for input(s): DDIMER in the last 72 hours. Hgb A1c Recent Labs    05/21/21 1831  HGBA1C 5.3   Lipid Profile No results for input(s): CHOL, HDL, LDLCALC, TRIG, CHOLHDL, LDLDIRECT in the last 72 hours. Thyroid function studies No results for input(s): TSH, T4TOTAL, T3FREE, THYROIDAB in the last 72 hours.  Invalid input(s): FREET3 Anemia work up No results for input(s): VITAMINB12, FOLATE, FERRITIN, TIBC, IRON, RETICCTPCT in the last 72 hours. Urinalysis    Component Value Date/Time   COLORURINE YELLOW 11/25/2016  1725   APPEARANCEUR HAZY (A) 11/25/2016 1725   LABSPEC 1.008 11/25/2016 1725   PHURINE 5.0 11/25/2016 1725   GLUCOSEU NEGATIVE 11/25/2016 1725   HGBUR MODERATE (A) 11/25/2016 1725   BILIRUBINUR NEGATIVE 11/25/2016 1725   BILIRUBINUR negative 11/25/2016 1323   BILIRUBINUR negative 05/26/2016 1103   KETONESUR NEGATIVE 11/25/2016 1725   KETONESUR negative 11/25/2016 1323   PROTEINUR 30 (A) 11/25/2016 1725   PROTEINUR =30 (A) 11/25/2016 1323   UROBILINOGEN 0.2 11/25/2016 1323   NITRITE NEGATIVE 11/25/2016 1725   NITRITE Negative 11/25/2016 1323   LEUKOCYTESUR LARGE (A) 11/25/2016 1725   Sepsis Labs Invalid input(s): PROCALCITONIN,  WBC,  LACTICIDVEN Microbiology Recent Results (from the past 240 hour(s))  SARS CORONAVIRUS 2 (TAT 6-24 HRS) Nasopharyngeal Nasopharyngeal Swab     Status: None   Collection Time: 05/21/21  1:19 PM   Specimen: Nasopharyngeal Swab  Result Value Ref Range Status   SARS Coronavirus 2 NEGATIVE NEGATIVE Final    Comment: (NOTE) SARS-CoV-2 target nucleic acids are NOT DETECTED.  The SARS-CoV-2 RNA is generally detectable in upper and lower respiratory specimens during the acute phase of infection. Negative results do not preclude SARS-CoV-2 infection, do not rule out co-infections with other pathogens, and should not be used as the sole basis for treatment or other patient management  decisions. Negative results must be combined with clinical observations, patient history, and epidemiological information. The expected result is Negative.  Fact Sheet for Patients: HairSlick.nohttps://www.fda.gov/media/138098/download  Fact Sheet for Healthcare Providers: quierodirigir.comhttps://www.fda.gov/media/138095/download  This test is not yet approved or cleared by the [place] FDA and  has been authorized for detection and/or diagnosis of SARS-CoV-2 by FDA under an Emergency Use Authorization (EUA). This EUA will remain  in effect (meaning this test can be used) for the duration of the COVID-19 declaration under Se ction 564(b)(1) of the Act, 21 U.S.C. section 360bbb-3(b)(1), unless the authorization is terminated or revoked sooner.  Performed at [facility], [address] [place], [place] [address]   MRSA Next Gen by PCR, Nasal     Status: None   Collection Time: 05/21/21 11:07 PM   Specimen: Nasal Mucosa; Nasal Swab  Result Value Ref Range Status   MRSA by PCR Next Gen NOT DETECTED NOT DETECTED Final    Comment: (NOTE) The GeneXpert MRSA Assay (FDA approved for NASAL specimens only), is one component of a comprehensive MRSA colonization surveillance program. It is not intended to diagnose MRSA infection nor to guide or monitor treatment for MRSA infections. Test performance is not FDA approved in patients less than 54 years old. Performed at [facility], [address] [place], [place] [address]      Time coordinating discharge: 40 minutes  SIGNED:   Alba CoryBelkys A Kemba Hoppes, MD  [facility]

## 2021-05-25 ENCOUNTER — Telehealth: Payer: Self-pay | Admitting: Orthopaedic Surgery

## 2021-05-25 NOTE — Telephone Encounter (Signed)
Patient aware of the below message  

## 2021-05-25 NOTE — Telephone Encounter (Signed)
Pt had surgery on 05/22/21 with Dr. Magnus Ivan. Pt made 2 week follow up appt for 7/18 but had questions concerning discharge paperwork and instructions. The best call back number is [phone].

## 2021-05-28 ENCOUNTER — Other Ambulatory Visit: Payer: BLUE CROSS/BLUE SHIELD

## 2021-06-06 ENCOUNTER — Ambulatory Visit (INDEPENDENT_AMBULATORY_CARE_PROVIDER_SITE_OTHER): Payer: BC Managed Care – PPO | Admitting: Orthopaedic Surgery

## 2021-06-06 ENCOUNTER — Encounter: Payer: Self-pay | Admitting: Orthopaedic Surgery

## 2021-06-06 DIAGNOSIS — Z89412 Acquired absence of left great toe: Secondary | ICD-10-CM

## 2021-06-06 NOTE — Progress Notes (Signed)
The patient is just over 2 weeks status post a left great toe amputation at the level of the MTP joint secondary to osteomyelitis.  He reports that he is doing well overall.  Examination of his operative site shows the incision is healed nicely.  We did remove sutures in place Steri-Strips.  There is no evidence of breakdown.  He is not a diabetic but does have neuropathy.  He does have a palpable pulse on his dorsalis pedis pulse.  We talked at length about shoe wear and staying in the [place] shoe for at least 1 more week.  I would like to see him back for recheck in 4 weeks to make sure things are doing well.  All questions and concerns were answered and addressed.  If there are issues before then he knows to let us know.

## 2021-07-04 ENCOUNTER — Other Ambulatory Visit: Payer: Self-pay

## 2021-07-04 ENCOUNTER — Ambulatory Visit (INDEPENDENT_AMBULATORY_CARE_PROVIDER_SITE_OTHER): Payer: BC Managed Care – PPO | Admitting: Orthopaedic Surgery

## 2021-07-04 ENCOUNTER — Encounter: Payer: Self-pay | Admitting: Orthopaedic Surgery

## 2021-07-04 DIAGNOSIS — Z89412 Acquired absence of left great toe: Secondary | ICD-10-CM

## 2021-07-04 NOTE — Progress Notes (Signed)
The patient is now over 6 weeks out from a left great toe amputation.  He is diabetic.  He has had some chronic changes in his foot overall but the great toes done well.  He had a good palpable pulse in his foot.  Examination of his left foot shows that the great toe surgical site is healed completely.  I am concerned about his second toe and the lateral aspect of his foot which shows some chronic changes.  He is working on his shoe wear as well.  At this point I would like him to have an appoint with Dr. Lajoyce Corners for follow-up as it relates to chronic changes with his left foot.  He agrees with this referral as well.

## 2021-07-11 ENCOUNTER — Ambulatory Visit: Payer: BC Managed Care – PPO | Admitting: Orthopedic Surgery

## 2022-04-30 ENCOUNTER — Emergency Department (HOSPITAL_BASED_OUTPATIENT_CLINIC_OR_DEPARTMENT_OTHER)
Admission: EM | Admit: 2022-04-30 | Discharge: 2022-05-01 | Disposition: A | Discharge status: Home / Self Care | Payer: BC Managed Care – PPO | Attending: Emergency Medicine | Admitting: Emergency Medicine

## 2022-04-30 ENCOUNTER — Emergency Department (HOSPITAL_BASED_OUTPATIENT_CLINIC_OR_DEPARTMENT_OTHER): Payer: BC Managed Care – PPO

## 2022-04-30 ENCOUNTER — Other Ambulatory Visit: Payer: Self-pay

## 2022-04-30 DIAGNOSIS — R7309 Other abnormal glucose: Secondary | ICD-10-CM | POA: Diagnosis not present

## 2022-04-30 DIAGNOSIS — D72829 Elevated white blood cell count, unspecified: Secondary | ICD-10-CM | POA: Diagnosis not present

## 2022-04-30 DIAGNOSIS — Z23 Encounter for immunization: Secondary | ICD-10-CM | POA: Diagnosis not present

## 2022-04-30 DIAGNOSIS — Z20822 Contact with and (suspected) exposure to covid-19: Secondary | ICD-10-CM | POA: Diagnosis not present

## 2022-04-30 DIAGNOSIS — M86171 Other acute osteomyelitis, right ankle and foot: Secondary | ICD-10-CM | POA: Diagnosis not present

## 2022-04-30 DIAGNOSIS — I96 Gangrene, not elsewhere classified: Secondary | ICD-10-CM | POA: Diagnosis present

## 2022-04-30 DIAGNOSIS — L03031 Cellulitis of right toe: Secondary | ICD-10-CM | POA: Diagnosis not present

## 2022-04-30 DIAGNOSIS — Z4800 Encounter for change or removal of nonsurgical wound dressing: Secondary | ICD-10-CM | POA: Diagnosis not present

## 2022-04-30 DIAGNOSIS — M869 Osteomyelitis, unspecified: Secondary | ICD-10-CM | POA: Diagnosis not present

## 2022-04-30 LAB — CBC WITH DIFFERENTIAL/PLATELET
Abs Immature Granulocytes: 0.08 10*3/uL — ABNORMAL HIGH (ref 0.00–0.07)
Basophils Absolute: 0.1 10*3/uL (ref 0.0–0.1)
Basophils Relative: 1 %
Eosinophils Absolute: 0.3 10*3/uL (ref 0.0–0.5)
Eosinophils Relative: 2 %
HCT: 44.1 % (ref 39.0–52.0)
Hemoglobin: 15 g/dL (ref 13.0–17.0)
Immature Granulocytes: 1 %
Lymphocytes Relative: 19 %
Lymphs Abs: 2.6 10*3/uL (ref 0.7–4.0)
MCH: 30.5 pg (ref 26.0–34.0)
MCHC: 34 g/dL (ref 30.0–36.0)
MCV: 89.6 fL (ref 80.0–100.0)
Monocytes Absolute: 1.1 10*3/uL — ABNORMAL HIGH (ref 0.1–1.0)
Monocytes Relative: 8 %
Neutro Abs: 9.8 10*3/uL — ABNORMAL HIGH (ref 1.7–7.7)
Neutrophils Relative %: 69 %
Platelets: 142 10*3/uL — ABNORMAL LOW (ref 150–400)
RBC: 4.92 MIL/uL (ref 4.22–5.81)
RDW: 12.8 % (ref 11.5–15.5)
WBC: 13.9 10*3/uL — ABNORMAL HIGH (ref 4.0–10.5)
nRBC: 0 % (ref 0.0–0.2)

## 2022-04-30 LAB — BASIC METABOLIC PANEL
Anion gap: 12 (ref 5–15)
BUN: 30 mg/dL — ABNORMAL HIGH (ref 6–20)
CO2: 23 mmol/L (ref 22–32)
Calcium: 9.8 mg/dL (ref 8.9–10.3)
Chloride: 103 mmol/L (ref 98–111)
Creatinine, Ser: 0.9 mg/dL (ref 0.61–1.24)
GFR, Estimated: >60 mL/min (ref >60)
Glucose, Bld: 129 mg/dL — ABNORMAL HIGH (ref 70–99)
Potassium: 3.8 mmol/L (ref 3.5–5.1)
Sodium: 138 mmol/L (ref 135–145)

## 2022-04-30 MED ORDER — TETANUS-DIPHTH-ACELL PERTUSSIS 5-2.5-18.5 LF-MCG/0.5 IM SUSY
0.5000 mL | PREFILLED_SYRINGE | Freq: Once | INTRAMUSCULAR | Status: AC
  Administered 2022-05-01: 0.5 mL via INTRAMUSCULAR
  Filled 2022-04-30: qty 0.5

## 2022-04-30 MED ORDER — VANCOMYCIN HCL IN DEXTROSE 1-5 GM/200ML-% IV SOLN
1000.0000 mg | Freq: Once | INTRAVENOUS | Status: AC
  Administered 2022-05-01: 1000 mg via INTRAVENOUS
  Filled 2022-04-30: qty 200

## 2022-04-30 MED ORDER — SODIUM CHLORIDE 0.9 % IV SOLN
1.0000 g | INTRAVENOUS | Status: AC
  Administered 2022-05-01: 1 g via INTRAVENOUS

## 2022-04-30 NOTE — ED Notes (Signed)
RT placed PIV at 2223 in right forearm without difficulty. PIV secured, flushed, saline locked post blood draw for labs.

## 2022-04-30 NOTE — ED Triage Notes (Signed)
POV, pt has right middle toe infection, sts there was blisters and then over the last week toe has gotten worse, sts that he does not have much feeling in his feet and toes unsure of why. Toe is black. Ambulatory to triage, alert and oriented x 4

## 2022-05-01 ENCOUNTER — Encounter (HOSPITAL_BASED_OUTPATIENT_CLINIC_OR_DEPARTMENT_OTHER): Payer: Self-pay | Admitting: Emergency Medicine

## 2022-05-01 ENCOUNTER — Ambulatory Visit: Payer: BC Managed Care – PPO | Admitting: Orthopedic Surgery

## 2022-05-01 ENCOUNTER — Encounter: Payer: Self-pay | Admitting: Orthopedic Surgery

## 2022-05-01 DIAGNOSIS — I96 Gangrene, not elsewhere classified: Secondary | ICD-10-CM | POA: Diagnosis not present

## 2022-05-01 LAB — SARS CORONAVIRUS 2 BY RT PCR: SARS Coronavirus 2 by RT PCR: NEGATIVE

## 2022-05-01 MED ORDER — DOXYCYCLINE HYCLATE 100 MG PO TABS: 100.0000 mg | ORAL_TABLET | Freq: Two times a day (BID) | ORAL | 0 refills | Status: DC

## 2022-05-01 NOTE — ED Provider Notes (Signed)
[facility] EMERGENCY DEPT Provider Note   CSN: [id number] Arrival date & time: 04/30/22  2132     History  Chief Complaint  Patient presents with   Wound Infection    Evan Hamilton is a 56 y.o. male.  The history is provided by the patient.  Wound Check This is a recurrent problem. The current episode started more than 1 week ago. The problem occurs constantly. The problem has not changed since onset.Pertinent negatives include no chest pain, no abdominal pain, no headaches and no shortness of breath. Nothing aggravates the symptoms. Nothing relieves the symptoms. He has tried nothing for the symptoms. The treatment provided no relief.       Home Medications Prior to Admission medications   Medication Sig Start Date End Date Taking? Authorizing Provider  Acetaminophen (TYLENOL PO) Take 3 tablets by mouth daily as needed (headache).    [provider]  loratadine (CLARITIN) 10 MG tablet Take 10 mg by mouth every morning.    [provider]  Multiple Vitamin (MULTIVITAMIN WITH MINERALS) TABS tablet Take 1 tablet by mouth every morning.    [provider]  sulfamethoxazole-trimethoprim (BACTRIM DS) 800-160 MG tablet Take 1 tablet by mouth 2 (two) times daily. 05/23/21   Regalado, Cassie Freer, MD      Allergies    Penicillins    Review of Systems   Review of Systems  Constitutional:  Negative for fever.  HENT:  Negative for facial swelling.   Eyes:  Negative for redness.  Respiratory:  Negative for shortness of breath.   Cardiovascular:  Negative for chest pain.  Gastrointestinal:  Negative for abdominal pain and vomiting.  Skin:  Positive for color change and wound.  Neurological:  Negative for headaches.  All other systems reviewed and are negative.   Physical Exam Updated Vital Signs BP 117/78   Pulse 88   Temp 99.5 F (37.5 C) (Oral)   Resp 14   Ht 6\' 2"  (1.88 m)   Wt 136.1 kg   SpO2 96%   BMI 38.52 kg/m  Physical  Exam Vitals and nursing note reviewed.  Constitutional:      General: He is not in acute distress.    Appearance: He is well-developed. He is not diaphoretic.  HENT:     Head: Normocephalic and atraumatic.     Nose: Nose normal.  Eyes:     Conjunctiva/sclera: Conjunctivae normal.     Pupils: Pupils are equal, round, and reactive to light.     Comments: Normal appearance  Cardiovascular:     Rate and Rhythm: Normal rate and regular rhythm.     Pulses: Normal pulses.     Heart sounds: Normal heart sounds.  Pulmonary:     Effort: Pulmonary effort is normal. No respiratory distress.     Breath sounds: Normal breath sounds.  Abdominal:     General: Bowel sounds are normal. There is no distension.     Palpations: Abdomen is soft. There is no mass.     Tenderness: There is no abdominal tenderness. There is no guarding or rebound.  Genitourinary:    Comments: No CVA tenderness Musculoskeletal:        General: Normal range of motion.     Cervical back: Normal range of motion.  Skin:    General: Skin is warm and dry.     Findings: No rash.       Neurological:     General: No focal deficit present.  Mental Status: He is alert and oriented to person, place, and time.     Deep Tendon Reflexes: Reflexes normal.  Psychiatric:        Behavior: Behavior normal.        Judgment: Judgment normal.     ED Results / Procedures / Treatments   Labs (all labs ordered are listed, but only abnormal results are displayed) Results for orders placed or performed during the hospital encounter of 04/30/22  CBC with Differential  Result Value Ref Range   WBC 13.9 (H) 4.0 - 10.5 K/uL   RBC 4.92 4.22 - 5.81 MIL/uL   Hemoglobin 15.0 13.0 - 17.0 g/dL   HCT 44.1 39.0 - 52.0 %   MCV 89.6 80.0 - 100.0 fL   MCH 30.5 26.0 - 34.0 pg   MCHC 34.0 30.0 - 36.0 g/dL   RDW 12.8 11.5 - 15.5 %   Platelets 142 (L) 150 - 400 K/uL   nRBC 0.0 0.0 - 0.2 %   Neutrophils Relative % 69 %   Neutro Abs 9.8 (H) 1.7  - 7.7 K/uL   Lymphocytes Relative 19 %   Lymphs Abs 2.6 0.7 - 4.0 K/uL   Monocytes Relative 8 %   Monocytes Absolute 1.1 (H) 0.1 - 1.0 K/uL   Eosinophils Relative 2 %   Eosinophils Absolute 0.3 0.0 - 0.5 K/uL   Basophils Relative 1 %   Basophils Absolute 0.1 0.0 - 0.1 K/uL   Immature Granulocytes 1 %   Abs Immature Granulocytes 0.08 (H) 0.00 - 0.07 K/uL  Basic metabolic panel  Result Value Ref Range   Sodium 138 135 - 145 mmol/L   Potassium 3.8 3.5 - 5.1 mmol/L   Chloride 103 98 - 111 mmol/L   CO2 23 22 - 32 mmol/L   Glucose, Bld 129 (H) 70 - 99 mg/dL   BUN 30 (H) 6 - 20 mg/dL   Creatinine, Ser 0.90 0.61 - 1.24 mg/dL   Calcium 9.8 8.9 - 10.3 mg/dL   GFR, Estimated >60 >60 mL/min   Anion gap 12 5 - 15   DG Foot Complete Right  Result Date: 04/30/2022 CLINICAL DATA:  Infection of the right middle toe. EXAM: RIGHT FOOT COMPLETE - 3+ VIEW COMPARISON:  None Available. FINDINGS: There is fragmentation of the tuft of the distal phalanx of the third digit most consistent with osteomyelitis. Clinical correlation is recommended. Irregularity of the tip of the distal phalanx of the second digit with osteolysis, also likely sequela of osteomyelitis. No dislocation. The bones are osteopenic. There is hammertoe deformity. There is soft tissue swelling of the dorsum of the foot and forefoot primarily involving the second and third digit. No soft tissue gas. IMPRESSION: 1. Findings most consistent with osteomyelitis of the distal phalanges of the second and third digits. 2. Soft tissue swelling of the dorsum of the foot and forefoot. Electronically Signed   By: Anner Crete M.D.   On: 04/30/2022 22:30     Radiology DG Foot Complete Right  Result Date: 04/30/2022 CLINICAL DATA:  Infection of the right middle toe. EXAM: RIGHT FOOT COMPLETE - 3+ VIEW COMPARISON:  None Available. FINDINGS: There is fragmentation of the tuft of the distal phalanx of the third digit most consistent with osteomyelitis.  Clinical correlation is recommended. Irregularity of the tip of the distal phalanx of the second digit with osteolysis, also likely sequela of osteomyelitis. No dislocation. The bones are osteopenic. There is hammertoe deformity. There is soft tissue swelling of the  dorsum of the foot and forefoot primarily involving the second and third digit. No soft tissue gas. IMPRESSION: 1. Findings most consistent with osteomyelitis of the distal phalanges of the second and third digits. 2. Soft tissue swelling of the dorsum of the foot and forefoot. Electronically Signed   By: Anner Crete M.D.   On: 04/30/2022 22:30    Procedures Procedures    Medications Ordered in ED Medications  vancomycin (VANCOCIN) IVPB 1000 mg/200 mL premix (1,000 mg Intravenous New Bag/Given 05/01/22 0009)  ceFEPIme (MAXIPIME) 1 g in sodium chloride 0.9 % 100 mL IVPB (1 g Intravenous New Bag/Given 05/01/22 0006)  Tdap (BOOSTRIX) injection 0.5 mL (0.5 mLs Intramuscular Given 05/01/22 0010)    ED Course/ Medical Decision Making/ A&P                           Medical Decision Making R 4th toe infection for several weeks markedly worse x 2 days   Amount and/or Complexity of Data Reviewed Independent Historian: spouse    Details: see above External Data Reviewed: notes.    Details: previous notes reviewed Labs: ordered.    Details: all labs: white count elevated 13.9, normal hemoglobin 15, platelets 142k. sodium 138, normal potassium, normal creatinine, elevated glucose.  blood cultures sent and are pending. Radiology: ordered and independent interpretation performed.    Details: osteo by me on XR  Risk Prescription drug management. Drug therapy requiring intensive monitoring for toxicity. Decision regarding hospitalization. Risk Details: The patient appears reasonably stabilized for admission considering the current resources, flow, and capabilities available in the ED at this time, and I doubt any other [facility] requiring  further screening and/or treatment in the ED prior to admission.     Final Clinical Impression(s) / ED Diagnoses Final diagnoses:  Gangrene ([place])  Osteomyelitis, unspecified site, unspecified type [facility])    Rx / DC Orders ED Discharge Orders     None         Miangel Flom, MD 05/01/22 [id number]

## 2022-05-01 NOTE — Discharge Instructions (Addendum)
Please go to Dr. Audrie Lia office today at [facility]. He plans to fit you into his clinic schedule.

## 2022-05-01 NOTE — ED Notes (Signed)
Patient discharge to follow up in Dr. Lajoyce Corners office today at 1300 hr.  Patient gave verbal understanding of this

## 2022-05-01 NOTE — Progress Notes (Addendum)
Office Visit Note   Patient: Evan Hamilton           Date of Birth: 1966-03-18           MRN: [id number] Visit Date: 05/01/2022              Requested by: No referring provider defined for this encounter. PCP: Patient, No Pcp Per (Inactive)  Chief Complaint  Patient presents with   Right Foot - Wound Check    4th toe gangrene      HPI: Patient is a 56 year old gentleman who is seen for acute gangrenous ulceration and cellulitis right foot third toe.  Patient went to [facility] room was given IV antibiotics and is seen today for initial evaluation for surgical intervention.  Patient is status post a left great toe amputation by Dr. Magnus Ivan last year.  Assessment & Plan: Visit Diagnoses:  1. Gangrene of toe of right foot (HCC)     Plan: We will plan for amputation of the right foot third toe on Wednesday.  Prescription called in for doxycycline.  Risks and benefits were discussed including nonhealing of wound and need for additional surgery.  Patient states he understands wished to proceed at this time anticipate outpatient surgery.  Follow-Up Instructions: Return in about 1 week (around 05/08/2022).   Ortho Exam  Patient is alert, oriented, no adenopathy, well-dressed, normal affect, normal respiratory effort. Examination patient has a palpable dorsalis pedis and posterior tibial pulse he does have venous stasis swelling.  He has gangrenous changes to the third toe with cellulitis of the forefoot.  There is no cellulitis extending up the foot or leg.  The Doppler was used and he has a strong biphasic dorsalis pedis and posterior tibial pulse.  Review of the radiographs shows previous destructive bony changes to the tuft of the second and acute lytic changes of the tuft of the third toe with sausage digit swelling of the toe..  Imaging: DG Foot Complete Right  Result Date: 04/30/2022 CLINICAL DATA:  Infection of the right middle toe. EXAM: RIGHT FOOT COMPLETE -  3+ VIEW COMPARISON:  None Available. FINDINGS: There is fragmentation of the tuft of the distal phalanx of the third digit most consistent with osteomyelitis. Clinical correlation is recommended. Irregularity of the tip of the distal phalanx of the second digit with osteolysis, also likely sequela of osteomyelitis. No dislocation. The bones are osteopenic. There is hammertoe deformity. There is soft tissue swelling of the dorsum of the foot and forefoot primarily involving the second and third digit. No soft tissue gas. IMPRESSION: 1. Findings most consistent with osteomyelitis of the distal phalanges of the second and third digits. 2. Soft tissue swelling of the dorsum of the foot and forefoot. Electronically Signed   By: Elgie Collard M.D.   On: 04/30/2022 22:30   No images are attached to the encounter.  Labs: Lab Results  Component Value Date   HGBA1C 5.3 05/21/2021   HGBA1C 8.0 (H) 11/26/2016   HGBA1C 7.9 (H) 11/07/2016   ESRSEDRATE 30 (H) 05/22/2021   ESRSEDRATE 33 (H) 05/21/2021   ESRSEDRATE 32 (H) 05/26/2016   CRP 2.3 (H) 05/22/2021   CRP 2.7 (H) 05/21/2021   CRP 26.9 (H) 07/31/2016   LABURIC 7.2 07/31/2016   REPTSTATUS PENDING 04/30/2022   CULT PENDING 04/30/2022     Lab Results  Component Value Date   ALBUMIN 3.5 05/21/2021   ALBUMIN 3.4 (L) 05/21/2021   ALBUMIN 2.5 (L) 11/25/2016  No results found for: "MG" No results found for: "VD25OH"  No results found for: "PREALBUMIN"    Latest Ref Rng & Units 04/30/2022   10:32 PM 05/22/2021    6:03 AM 05/21/2021    6:31 PM  CBC EXTENDED  WBC 4.0 - 10.5 K/uL 13.9  8.9  9.4   RBC 4.22 - 5.81 MIL/uL 4.92  4.62  4.95   Hemoglobin 13.0 - 17.0 g/dL 60.1  09.3  23.5   HCT 39.0 - 52.0 % 44.1  41.9  45.2   Platelets 150 - 400 K/uL 142  123  148   NEUT# 1.7 - 7.7 K/uL 9.8     Lymph# 0.7 - 4.0 K/uL 2.6        There is no height or weight on file to calculate BMI.  Orders:  No orders of the defined types were placed in this  encounter.  No orders of the defined types were placed in this encounter.    Procedures: No procedures performed  Clinical Data: No additional findings.  ROS:  All other systems negative, except as noted in the HPI. Review of Systems  Objective: Vital Signs: There were no vitals taken for this visit.  Specialty Comments:  No specialty comments available.  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   Osteomyelitis of great toe of left foot (HCC)    Osteomyelitis of ankle or foot, acute, left (HCC) 05/21/2021   Open fracture of left great toe 05/21/2021   AKI (acute kidney injury) (HCC) 11/25/2016   Pyelonephritis 11/25/2016   Acute biliary pancreatitis 07/18/2014   Past Medical History:  Diagnosis Date   Allergy    Anxiety and depression    Diabetes mellitus without complication (HCC)     Family History  Problem Relation Age of Onset   Cancer Mother        Lung   Diabetes Mother    Heart disease Father    Hyperlipidemia Father    Hypertension Father    Mental illness Sister    Cancer Maternal Grandmother        Lung   Diabetes Maternal Grandmother     Past Surgical History:  Procedure Laterality Date   AMPUTATION Left 05/22/2021   Procedure: GREAT TOE AMPUTATION;  Surgeon: Kathryne Hitch, MD;  Location: [facility] OR;  Service: Orthopedics;  Laterality: Left;   CHOLECYSTECTOMY N/A 07/22/2014   Procedure: LAPAROSCOPIC CHOLECYSTECTOMY WITH INTRAOPERATIVE CHOLANGIOGRAM;  Surgeon: Harriette Bouillon, MD;  Location: [facility] OR;  Service: General;  Laterality: N/A;   Social History   Occupational History   Not on file  Tobacco Use   Smoking status: Some Days    Packs/day: 0.25    Years: 20.00    Total pack years: 5.00    Types: Cigars, Cigarettes   Smokeless tobacco: Never  Substance and Sexual Activity   Alcohol use: Yes    Alcohol/week:  3.0 standard drinks of alcohol    Types: 3 Standard drinks or equivalent per week   Drug use: No   Sexual activity: Never

## 2022-05-01 NOTE — ED Provider Notes (Signed)
  Physical Exam  BP (!) 99/58   Pulse 74   Temp 99.5 F (37.5 C) (Oral)   Resp 20   Ht 6\' 2"  (1.88 m)   Wt (!) 146.7 kg   SpO2 91%   BMI 41.52 kg/m     Procedures  Procedures  ED Course / MDM    Medical Decision Making Amount and/or Complexity of Data Reviewed Labs: ordered. Radiology: ordered.  Risk Prescription drug management.   Received a call from [facility].  The patient is currently admitted awaiting a bed for osteomyelitis and gangrene of the toes.  CMO, Dr. has been coordinating potential outpatient treatment at Dr. Lindie Spruce office with orthopedics.  Patient vitals reviewed.  Not septic. Plan for follow-up in clinic with Dr. Audrie Lia today.        Lajoyce Corners, MD 05/01/22 [phone]

## 2022-05-02 ENCOUNTER — Encounter (HOSPITAL_COMMUNITY): Payer: Self-pay | Admitting: Orthopedic Surgery

## 2022-05-02 NOTE — Progress Notes (Signed)
PCP - pt denies Cardiologist - pt denies EKG - DOS Chest x-ray -  ECHO -  Cardiac Cath -  CPAP -   ERAS Protcol -  COVID TEST- n/a  Anesthesia review: n/a  -------------  SDW INSTRUCTIONS:  Your procedure is scheduled on 6/14 . Please report to [facility] Main Entrance "A" at 0720 A.M., and check in at the Admitting office. Call this number if you have problems the morning of surgery: [phone]   Remember: Do not eat or drink after midnight the night before your surgery   Medications to take morning of surgery with a sip of water include: acetaminophen (TYLENOL) if needed doxycycline (VIBRA-TABS)  loratadine (CLARITIN)   As of today, STOP taking any Aspirin (unless otherwise instructed by your surgeon), Aleve, Naproxen, Ibuprofen, Motrin, Advil, Goody's, BC's, all herbal medications, fish oil, and all vitamins.    The Morning of Surgery Do not wear jewelry Do not wear lotions, powders, or perfumes/colognes, or deodorant Do not bring valuables to the hospital. [facility] is not responsible for any belongings or valuables.  If you are a smoker, DO NOT Smoke 24 hours prior to surgery  If you wear a CPAP at night please bring your mask the morning of surgery   Remember that you must have someone to transport you home after your surgery, and remain with you for 24 hours if you are discharged the same day.  Please bring cases for contacts, glasses, hearing aids, dentures or bridgework because it cannot be worn into surgery.   Patients discharged the day of surgery will not be allowed to drive home.   Please shower the NIGHT BEFORE/MORNING OF SURGERY (use antibacterial soap like DIAL soap if possible). Wear comfortable clothes the morning of surgery. Oral Hygiene is also important to reduce your risk of infection.  Remember - BRUSH YOUR TEETH THE MORNING OF SURGERY WITH YOUR REGULAR TOOTHPASTE  Patient denies shortness of breath, fever, cough and chest pain.

## 2022-05-03 ENCOUNTER — Ambulatory Visit (HOSPITAL_COMMUNITY)
Admission: RE | Admit: 2022-05-03 | Discharge: 2022-05-03 | Disposition: A | Discharge status: Home / Self Care | Payer: BC Managed Care – PPO | Attending: Orthopedic Surgery | Admitting: Orthopedic Surgery

## 2022-05-03 ENCOUNTER — Encounter (HOSPITAL_COMMUNITY)
Admission: RE | Disposition: A | Discharge status: Home / Self Care | Payer: Self-pay | Source: Home / Self Care | Attending: Orthopedic Surgery

## 2022-05-03 ENCOUNTER — Other Ambulatory Visit: Payer: Self-pay

## 2022-05-03 ENCOUNTER — Ambulatory Visit (HOSPITAL_COMMUNITY): Payer: BC Managed Care – PPO | Admitting: Anesthesiology

## 2022-05-03 ENCOUNTER — Encounter (HOSPITAL_COMMUNITY): Payer: Self-pay | Admitting: Orthopedic Surgery

## 2022-05-03 DIAGNOSIS — G4730 Sleep apnea, unspecified: Secondary | ICD-10-CM | POA: Insufficient documentation

## 2022-05-03 DIAGNOSIS — Z6841 Body Mass Index (BMI) 40.0 and over, adult: Secondary | ICD-10-CM | POA: Diagnosis not present

## 2022-05-03 DIAGNOSIS — L97519 Non-pressure chronic ulcer of other part of right foot with unspecified severity: Secondary | ICD-10-CM | POA: Diagnosis not present

## 2022-05-03 DIAGNOSIS — L02611 Cutaneous abscess of right foot: Secondary | ICD-10-CM | POA: Diagnosis not present

## 2022-05-03 DIAGNOSIS — I96 Gangrene, not elsewhere classified: Secondary | ICD-10-CM | POA: Diagnosis not present

## 2022-05-03 DIAGNOSIS — E11621 Type 2 diabetes mellitus with foot ulcer: Secondary | ICD-10-CM | POA: Diagnosis not present

## 2022-05-03 DIAGNOSIS — E1152 Type 2 diabetes mellitus with diabetic peripheral angiopathy with gangrene: Secondary | ICD-10-CM | POA: Diagnosis not present

## 2022-05-03 DIAGNOSIS — F17210 Nicotine dependence, cigarettes, uncomplicated: Secondary | ICD-10-CM | POA: Insufficient documentation

## 2022-05-03 DIAGNOSIS — L03115 Cellulitis of right lower limb: Secondary | ICD-10-CM | POA: Diagnosis not present

## 2022-05-03 HISTORY — DX: Sleep apnea, unspecified: G47.30

## 2022-05-03 HISTORY — PX: AMPUTATION: SHX166

## 2022-05-03 LAB — GLUCOSE, CAPILLARY: Glucose-Capillary: 105 mg/dL — ABNORMAL HIGH (ref 70–99)

## 2022-05-03 SURGERY — AMPUTATION DIGIT
Anesthesia: Monitor Anesthesia Care | Site: Third Toe | Laterality: Right

## 2022-05-03 MED ORDER — FENTANYL CITRATE (PF) 100 MCG/2ML IJ SOLN
INTRAMUSCULAR | Status: AC
  Administered 2022-05-03: 100 ug via INTRAVENOUS
  Filled 2022-05-03: qty 2

## 2022-05-03 MED ORDER — DEXMEDETOMIDINE (PRECEDEX) IN NS 20 MCG/5ML (4 MCG/ML) IV SYRINGE
PREFILLED_SYRINGE | INTRAVENOUS | Status: DC | PRN
  Administered 2022-05-03: 8 ug via INTRAVENOUS

## 2022-05-03 MED ORDER — ORAL CARE MOUTH RINSE: 15.0000 mL | Freq: Once | OROMUCOSAL | Status: AC

## 2022-05-03 MED ORDER — CEFAZOLIN IN SODIUM CHLORIDE 3-0.9 GM/100ML-% IV SOLN
3.0000 g | INTRAVENOUS | Status: DC
  Filled 2022-05-03: qty 100

## 2022-05-03 MED ORDER — STERILE WATER FOR IRRIGATION IR SOLN
Status: DC | PRN
  Administered 2022-05-03: 1000 mL

## 2022-05-03 MED ORDER — CHLORHEXIDINE GLUCONATE 0.12 % MT SOLN
15.0000 mL | Freq: Once | OROMUCOSAL | Status: AC
  Administered 2022-05-03: 15 mL via OROMUCOSAL
  Filled 2022-05-03: qty 15

## 2022-05-03 MED ORDER — MIDAZOLAM HCL 2 MG/2ML IJ SOLN
INTRAMUSCULAR | Status: AC
  Administered 2022-05-03: 2 mg via INTRAVENOUS
  Filled 2022-05-03: qty 2

## 2022-05-03 MED ORDER — PROPOFOL 10 MG/ML IV BOLUS
INTRAVENOUS | Status: DC | PRN
  Administered 2022-05-03: 30 mg via INTRAVENOUS

## 2022-05-03 MED ORDER — LIDOCAINE 2% (20 MG/ML) 5 ML SYRINGE
INTRAMUSCULAR | Status: DC | PRN
  Administered 2022-05-03: 100 mg via INTRAVENOUS

## 2022-05-03 MED ORDER — PROPOFOL 500 MG/50ML IV EMUL
INTRAVENOUS | Status: DC | PRN
  Administered 2022-05-03: 100 ug/kg/min via INTRAVENOUS

## 2022-05-03 MED ORDER — LACTATED RINGERS IV SOLN
INTRAVENOUS | Status: DC
Start: 2022-05-03 — End: 2022-05-03

## 2022-05-03 MED ORDER — FENTANYL CITRATE (PF) 100 MCG/2ML IJ SOLN: 100.0000 ug | Freq: Once | INTRAMUSCULAR | Status: AC

## 2022-05-03 MED ORDER — DEXTROSE 5 % IV SOLN
INTRAVENOUS | Status: DC | PRN
  Administered 2022-05-03: 3 g via INTRAVENOUS

## 2022-05-03 MED ORDER — BUPIVACAINE HCL (PF) 0.5 % IJ SOLN
INTRAMUSCULAR | Status: DC | PRN
  Administered 2022-05-03: 40 mL

## 2022-05-03 MED ORDER — HYDROCODONE-ACETAMINOPHEN 5-325 MG PO TABS: 1.0000 | ORAL_TABLET | ORAL | 0 refills | Status: DC | PRN

## 2022-05-03 MED ORDER — MIDAZOLAM HCL 2 MG/2ML IJ SOLN: 2.0000 mg | Freq: Once | INTRAMUSCULAR | Status: AC

## 2022-05-03 MED ORDER — 0.9 % SODIUM CHLORIDE (POUR BTL) OPTIME
TOPICAL | Status: DC | PRN
  Administered 2022-05-03: 1000 mL

## 2022-05-03 SURGICAL SUPPLY — 38 items
BAG COUNTER SPONGE SURGICOUNT (BAG) ×2 IMPLANT
BLADE SURG 21 STRL SS (BLADE) ×2 IMPLANT
BNDG COHESIVE 1X5 TAN STRL LF (GAUZE/BANDAGES/DRESSINGS) ×1 IMPLANT
BNDG COHESIVE 4X5 TAN STRL (GAUZE/BANDAGES/DRESSINGS) ×2 IMPLANT
BNDG ELASTIC 4X5.8 VLCR STR LF (GAUZE/BANDAGES/DRESSINGS) ×1 IMPLANT
BNDG ELASTIC 6X10 VLCR STRL LF (GAUZE/BANDAGES/DRESSINGS) ×1 IMPLANT
BNDG GAUZE DERMACEA FLUFF (GAUZE/BANDAGES/DRESSINGS) ×1
BNDG GAUZE DERMACEA FLUFF 4 (GAUZE/BANDAGES/DRESSINGS) IMPLANT
BNDG GAUZE ELAST 4 BULKY (GAUZE/BANDAGES/DRESSINGS) ×2 IMPLANT
CNTNR URN SCR LID CUP LEK RST (MISCELLANEOUS) IMPLANT
CONT SPEC 4OZ STRL OR WHT (MISCELLANEOUS) ×1
COVER SURGICAL LIGHT HANDLE (MISCELLANEOUS) ×4 IMPLANT
DRAPE U-SHAPE 47X51 STRL (DRAPES) ×2 IMPLANT
DRSG ADAPTIC 3X8 NADH LF (GAUZE/BANDAGES/DRESSINGS) ×1 IMPLANT
DRSG DERMACEA NONADH 3X8 (GAUZE/BANDAGES/DRESSINGS) ×1 IMPLANT
DRSG PAD ABDOMINAL 8X10 ST (GAUZE/BANDAGES/DRESSINGS) ×1 IMPLANT
DURAPREP 26ML APPLICATOR (WOUND CARE) ×2 IMPLANT
ELECT REM PT RETURN 9FT ADLT (ELECTROSURGICAL) ×2
ELECTRODE REM PT RTRN 9FT ADLT (ELECTROSURGICAL) ×1 IMPLANT
GAUZE 4X4 16PLY ~~LOC~~+RFID DBL (SPONGE) ×1 IMPLANT
GAUZE SPONGE 4X4 12PLY STRL (GAUZE/BANDAGES/DRESSINGS) ×1 IMPLANT
GLOVE BIOGEL PI IND STRL 9 (GLOVE) ×1 IMPLANT
GLOVE BIOGEL PI INDICATOR 9 (GLOVE) ×1
GLOVE SURG ORTHO 9.0 STRL STRW (GLOVE) ×2 IMPLANT
GOWN STRL REUS W/ TWL XL LVL3 (GOWN DISPOSABLE) ×2 IMPLANT
GOWN STRL REUS W/TWL XL LVL3 (GOWN DISPOSABLE) ×2
KIT BASIN OR (CUSTOM PROCEDURE TRAY) ×2 IMPLANT
KIT TURNOVER KIT B (KITS) ×2 IMPLANT
NEEDLE 22X1 1/2 (OR ONLY) (NEEDLE) ×1 IMPLANT
NS IRRIG 1000ML POUR BTL (IV SOLUTION) ×2 IMPLANT
PACK ORTHO EXTREMITY (CUSTOM PROCEDURE TRAY) ×2 IMPLANT
PAD ARMBOARD 7.5X6 YLW CONV (MISCELLANEOUS) ×4 IMPLANT
SPONGE T-LAP 18X18 ~~LOC~~+RFID (SPONGE) ×1 IMPLANT
SUT ETHILON 2 0 PSLX (SUTURE) ×3 IMPLANT
SYR CONTROL 10ML LL (SYRINGE) IMPLANT
TOWEL GREEN STERILE (TOWEL DISPOSABLE) ×2 IMPLANT
TUBE CONNECTING 12X1/4 (SUCTIONS) ×1 IMPLANT
YANKAUER SUCT BULB TIP NO VENT (SUCTIONS) ×1 IMPLANT

## 2022-05-03 NOTE — Anesthesia Postprocedure Evaluation (Signed)
Anesthesia Post Note  Patient: Evan Hamilton  Procedure(s) Performed: RIGHT THIRD TOE AMPUTATION (Right: Third Toe)     Patient location during evaluation: PACU Anesthesia Type: MAC and Regional Level of consciousness: awake and alert, patient cooperative and oriented Pain management: pain level controlled Vital Signs Assessment: post-procedure vital signs reviewed and stable Respiratory status: spontaneous breathing, nonlabored ventilation and respiratory function stable Cardiovascular status: stable and blood pressure returned to baseline Postop Assessment: no apparent nausea or vomiting Anesthetic complications: no   No notable events documented.  Last Vitals:  Vitals:   05/03/22 1022 05/03/22 1037  BP: 106/70 100/70  Pulse: 67 62  Resp: 18 13  Temp:  36.6 C  SpO2: 99% 99%    Last Pain:  Vitals:   05/03/22 1037  PainSc: 0-No pain                 Shervon Kerwin,E. Annalissa Murphey

## 2022-05-03 NOTE — Anesthesia Procedure Notes (Signed)
Anesthesia Regional Block: Ankle block   Pre-Anesthetic Checklist: , timeout performed,  Correct Patient, Correct Site, Correct Laterality,  Correct Procedure, Correct Position, site marked,  Risks and benefits discussed,  Surgical consent,  Pre-op evaluation,  At surgeon's request and post-op pain management  Laterality: Right and Lower  Prep: chloraprep       Needles:  Injection technique: Single-shot  Needle Type: Quincke      Needle Gauge: 25     Additional Needles:   Narrative:  Start time: 05/03/2022 8:41 AM End time: 05/03/2022 8:50 AM Injection made incrementally with aspirations every 5 mL.  Performed by: Personally   Additional Notes: Patient identified in Holding Room. Routine monitors on.  IV access established.  Sterile prep and drape ankle.  Perineural infiltration of local anesthetic incrementally with #25ga needle around deep and superficial peroneal, saphenous, posterior tibial, and sural nerves.  No heme aspirated. Patient asymptomatic. VSS.  Patient tolerated well.  Total 40cc 0.5% Bupivacaine used.  Jenita Seashore, MD

## 2022-05-03 NOTE — Op Note (Signed)
05/03/2022  9:59 AM  PATIENT:  Evan Hamilton    PRE-OPERATIVE DIAGNOSIS:  Gangrene Right Third Toe  POST-OPERATIVE DIAGNOSIS:  Same  PROCEDURE:  RIGHT THIRD RAY AMPUTATION  SURGEON:  Newt Minion, MD  PHYSICIAN ASSISTANT:None ANESTHESIA:   General  PREOPERATIVE INDICATIONS:  Evan Hamilton is a  56 y.o. male with a diagnosis of Gangrene Right Third Toe who failed conservative measures and elected for surgical management.    The risks benefits and alternatives were discussed with the patient preoperatively including but not limited to the risks of infection, bleeding, nerve injury, cardiopulmonary complications, the need for revision surgery, among others, and the patient was willing to proceed.  OPERATIVE IMPLANTS: None  _0 @  OPERATIVE FINDINGS: Patient had abscess that extended to the MTP joint necessitating ray resection.  Soft tissue was sent for cultures.  OPERATIVE PROCEDURE: Patient was brought the operating room and underwent a MAC anesthetic after regional anesthetic.  After adequate levels anesthesia obtained patient's right lower extremity was prepped using DuraPrep draped into a sterile field a timeout was called.  IV incision was made around the necrotic third toe.  This was carried down to the MTP joint.  There is abscess fluid that extended down to the MTP joint.  The incision was extended proximally and a third ray amputation was performed the soft tissue margins were debrided back to healthy viable tissue.  The soft tissue was sent for cultures.  The wound was irrigated with normal saline electrocautery was used hemostasis.  The incision was closed using 2-0 nylon a sterile dressing was applied   DISCHARGE PLANNING:  Antibiotic duration: Continue doxycycline we will adjust antibiotics pending culture sensitivities  Weightbearing: Touchdown weightbearing on the right  Pain medication: Prescription for Vicodin  Dressing care/ Wound VAC: Reinforce dressing as  needed  Ambulatory devices: Crutches  Discharge to: Home.  Follow-up: In the office 1 week post operative.

## 2022-05-03 NOTE — Transfer of Care (Signed)
Immediate Anesthesia Transfer of Care Note  Patient: Evan Hamilton  Procedure(s) Performed: RIGHT THIRD TOE AMPUTATION (Right: Third Toe)  Patient Location: PACU  Anesthesia Type:MAC  Level of Consciousness: drowsy and patient cooperative  Airway & Oxygen Therapy: Patient Spontanous Breathing  Post-op Assessment: Report given to RN and Post -op Vital signs reviewed and stable  Post vital signs: Reviewed and stable  Last Vitals:  Vitals Value Taken Time  BP 100/70 05/03/22 1037  Temp 36.6 C 05/03/22 1037  Pulse 65 05/03/22 1038  Resp 14 05/03/22 1038  SpO2 99 % 05/03/22 1038  Vitals shown include unvalidated device data.  Last Pain:  Vitals:   05/03/22 1037  PainSc: 0-No pain         Complications: No notable events documented.

## 2022-05-03 NOTE — H&P (Signed)
Evan Hamilton is an 56 y.o. male.   Chief Complaint: Osteomyelitis abscess right third toe HPI: Patient is a 56 year old gentleman who is seen for acute gangrenous ulceration and cellulitis right foot third toe.  Patient went to [facility] room was given IV antibiotics and is seen today for initial evaluation for surgical intervention.  Past Medical History:  Diagnosis Date   Allergy    Anxiety and depression    Diabetes mellitus without complication ([place])    Sleep apnea    does not wear CPAP    Past Surgical History:  Procedure Laterality Date   AMPUTATION Left 05/22/2021   Procedure: GREAT TOE AMPUTATION;  Surgeon: Mcarthur Rossetti, MD;  Location: [place];  Service: Orthopedics;  Laterality: Left;   CHOLECYSTECTOMY N/A 07/22/2014   Procedure: LAPAROSCOPIC CHOLECYSTECTOMY WITH INTRAOPERATIVE CHOLANGIOGRAM;  Surgeon: Erroll Luna, MD;  Location: [place];  Service: General;  Laterality: N/A;    Family History  Problem Relation Age of Onset   Cancer Mother        Lung   Diabetes Mother    Heart disease Father    Hyperlipidemia Father    Hypertension Father    Mental illness Sister    Cancer Maternal Grandmother        Lung   Diabetes Maternal Grandmother    Social History:  reports that he has been smoking cigars and cigarettes. He has a 10.00 pack-year smoking history. He has never used smokeless tobacco. He reports current alcohol use of about 3.0 standard drinks of alcohol per week. He reports that he does not use drugs.  Allergies:  Allergies  Allergen Reactions   Penicillins Hives and Rash    Tolerates Cephalosporins    No medications prior to admission.    No results found for this or any previous visit (from the past 48 hour(s)). No results found.  Review of Systems  All other systems reviewed and are negative.   Height 6\' 2"  (1.88 m), weight (!) 141.5 kg. Physical Exam  Patient is alert, oriented, no adenopathy, well-dressed, normal affect,  normal respiratory effort. Examination patient has a palpable dorsalis pedis and posterior tibial pulse he does have venous stasis swelling.  He has gangrenous changes to the third toe with cellulitis of the forefoot.  There is no cellulitis extending up the foot or leg.  The Doppler was used and he has a strong biphasic dorsalis pedis and posterior tibial pulse.   Review of the radiographs shows previous destructive bony changes to the tuft of the second and acute lytic changes of the tuft of the third toe with sausage digit swelling of the toe.. Assessment/Plan 1. Gangrene of toe of right foot (Evan Hamilton)       Plan: We will plan for amputation of the right foot third toe on Wednesday.  Prescription called in for doxycycline.  Risks and benefits were discussed including nonhealing of wound and need for additional surgery.  Patient states he understands wished to proceed at this time anticipate outpatient surgery.  Newt Minion, MD 05/03/2022, 7:14 AM

## 2022-05-03 NOTE — Anesthesia Preprocedure Evaluation (Addendum)
Anesthesia Evaluation  Patient identified by MRN, date of birth, ID band Patient awake    Reviewed: Allergy & Precautions, NPO status , Patient's Chart, lab work & pertinent test results  History of Anesthesia Complications Negative for: history of anesthetic complications  Airway Mallampati: II  TM Distance: >3 FB Neck ROM: Full    Dental  (+) Dental Advisory Given   Pulmonary sleep apnea (does not use CPAP) , Current Smoker and Patient abstained from smoking.,    breath sounds clear to auscultation       Cardiovascular negative cardio ROS   Rhythm:Regular Rate:Normal     Neuro/Psych Anxiety Depression negative neurological ROS     GI/Hepatic negative GI ROS, Neg liver ROS,   Endo/Other  diabetes (glu 129, diet controlled)Morbid obesity  Renal/GU Renal InsufficiencyRenal disease  negative genitourinary   Musculoskeletal Gangrenous toe   Abdominal (+) + obese,   Peds  Hematology negative hematology ROS (+)   Anesthesia Other Findings   Reproductive/Obstetrics                           Anesthesia Physical Anesthesia Plan  ASA: 3  Anesthesia Plan: MAC and Regional   Post-op Pain Management: Toradol IV (intra-op)* and Ofirmev IV (intra-op)*   Induction: Intravenous  PONV Risk Score and Plan: Ondansetron, Dexamethasone, Propofol infusion, Midazolam and Treatment may vary due to age or medical condition  Airway Management Planned: Simple Face Mask, Natural Airway and Nasal Cannula  Additional Equipment: None  Intra-op Plan:   Post-operative Plan:   Informed Consent: I have reviewed the patients History and Physical, chart, labs and discussed the procedure including the risks, benefits and alternatives for the proposed anesthesia with the patient or authorized representative who has indicated his/her understanding and acceptance.     Dental advisory given  Plan Discussed with:  CRNA and Surgeon  Anesthesia Plan Comments: (Lab Results      Component                Value               Date                      WBC                      13.9 (H)            04/30/2022                HGB                      15.0                04/30/2022                HCT                      44.1                04/30/2022                MCV                      89.6                04/30/2022  PLT                      142 (L)             04/30/2022           Lab Results      Component                Value               Date                      NA                       138                 04/30/2022                K                        3.8                 04/30/2022                CO2                      23                  04/30/2022                GLUCOSE                  129 (H)             04/30/2022                BUN                      30 (H)              04/30/2022                CREATININE               0.90                04/30/2022                CALCIUM                  9.8                 04/30/2022                GFRNONAA                 >60                 04/30/2022          Plan routine monitors, ankle block with sedation  )       Anesthesia Quick Evaluation

## 2022-05-04 ENCOUNTER — Encounter (HOSPITAL_COMMUNITY): Payer: Self-pay | Admitting: Orthopedic Surgery

## 2022-05-06 LAB — CULTURE, BLOOD (ROUTINE X 2)
Culture: NO GROWTH
Culture: NO GROWTH
Special Requests: ADEQUATE

## 2022-05-09 LAB — ANAEROBIC CULTURE W GRAM STAIN: Gram Stain: NONE SEEN

## 2022-05-11 ENCOUNTER — Ambulatory Visit (INDEPENDENT_AMBULATORY_CARE_PROVIDER_SITE_OTHER): Payer: BC Managed Care – PPO | Admitting: Orthopedic Surgery

## 2022-05-11 DIAGNOSIS — Z89412 Acquired absence of left great toe: Secondary | ICD-10-CM

## 2022-05-11 DIAGNOSIS — Z89421 Acquired absence of other right toe(s): Secondary | ICD-10-CM

## 2022-05-12 ENCOUNTER — Encounter: Payer: Self-pay | Admitting: Orthopedic Surgery

## 2022-05-18 ENCOUNTER — Ambulatory Visit (INDEPENDENT_AMBULATORY_CARE_PROVIDER_SITE_OTHER): Payer: BC Managed Care – PPO | Admitting: Orthopedic Surgery

## 2022-05-18 DIAGNOSIS — Z89421 Acquired absence of other right toe(s): Secondary | ICD-10-CM

## 2022-05-19 ENCOUNTER — Encounter: Payer: Self-pay | Admitting: Orthopedic Surgery

## 2022-05-19 NOTE — Progress Notes (Signed)
Office Visit Note   Patient: Evan Hamilton           Date of Birth: [birth year]           MRN: [id number] Visit Date: 05/18/2022              Requested by: No referring provider defined for this encounter. PCP: Patient, No Pcp Per  Chief Complaint  Patient presents with   Right Foot - Routine Post Op    05/03/2022 right foot 3rd toe amputation       HPI: Patient is a 56 year old gentleman who presents 2 weeks status post right foot third toe amputation.  Patient states he still has drainage is in the postoperative shoe for weightbearing.  He is on antibiotics.  Assessment & Plan: Visit Diagnoses:  1. 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  Follow-Up Instructions: Return in about 1 week (around 05/25/2022).   Ortho Exam  Patient is alert, oriented, no adenopathy, well-dressed, normal affect, normal respiratory effort. Examination there is good healthy granulation tissue with clear drainage there is no cellulitis no tenderness to palpation.  Imaging: No results found. No images are attached to the encounter.  Labs: Lab Results  Component Value Date   HGBA1C 5.3 05/21/2021   HGBA1C 8.0 (H) 11/26/2016   HGBA1C 7.9 (H) 11/07/2016   ESRSEDRATE 30 (H) 05/22/2021   ESRSEDRATE 33 (H) 05/21/2021   ESRSEDRATE 32 (H) 05/26/2016   CRP 2.3 (H) 05/22/2021   CRP 2.7 (H) 05/21/2021   CRP 26.9 (H) 07/31/2016   LABURIC 7.2 07/31/2016   REPTSTATUS 05/09/2022 FINAL 05/03/2022   GRAMSTAIN NO WBC SEEN NO ORGANISMS SEEN  05/03/2022   CULT  05/03/2022    FEW BACTEROIDES SPECIES NOT FRAGILIS FEW FUSOBACTERIUM NUCLEATUM BETA LACTAMASE POSITIVE Performed at [facility], [address] [place], [place] [address]      Lab Results  Component Value Date   ALBUMIN 3.5 05/21/2021   ALBUMIN 3.4 (L) 05/21/2021   ALBUMIN 2.5 (L) 11/25/2016    No results found for:  "MG" No results found for: "VD25OH"  No results found for: "PREALBUMIN"    Latest Ref Rng & Units 04/30/2022   10:32 PM 05/22/2021    6:03 AM 05/21/2021    6:31 PM  CBC EXTENDED  WBC 4.0 - 10.5 K/uL 13.9  8.9  9.4   RBC 4.22 - 5.81 MIL/uL 4.92  4.62  4.95   Hemoglobin 13.0 - 17.0 g/dL 44.8  18.5  63.1   HCT 39.0 - 52.0 % 44.1  41.9  45.2   Platelets 150 - 400 K/uL 142  123  148   NEUT# 1.7 - 7.7 K/uL 9.8     Lymph# 0.7 - 4.0 K/uL 2.6        There is no height or weight on file to calculate BMI.  Orders:  No orders of the defined types were placed in this encounter.  No orders of the defined types were placed in this encounter.    Procedures: No procedures performed  Clinical Data: No additional findings.  ROS:  All other systems negative, except as noted in the HPI. Review of Systems  Objective: Vital Signs: There were no vitals taken for this visit.  Specialty Comments:  No specialty comments available.  PMFS History: Patient Active Problem List   Diagnosis Date Noted   Gangrene of right  foot (HCC) 05/01/2022   Status post amputation of great toe, left (HCC) 06/06/2021   Osteomyelitis of great toe of left foot (HCC)    Osteomyelitis of ankle or foot, acute, left (HCC) 05/21/2021   Open fracture of left great toe 05/21/2021   AKI (acute kidney injury) (HCC) 11/25/2016   Pyelonephritis 11/25/2016   Acute biliary pancreatitis 07/18/2014   Past Medical History:  Diagnosis Date   Allergy    Anxiety and depression    Diabetes mellitus without complication (HCC)    Sleep apnea    does not wear CPAP    Family History  Problem Relation Age of Onset   Cancer Mother        Lung   Diabetes Mother    Heart disease Father    Hyperlipidemia Father    Hypertension Father    Mental illness Sister    Cancer Maternal Grandmother        Lung   Diabetes Maternal Grandmother     Past Surgical History:  Procedure Laterality Date   AMPUTATION Left 05/22/2021    Procedure: GREAT TOE AMPUTATION;  Surgeon: Kathryne Hitch, MD;  [facility];  Service: Orthopedics;  Laterality: Left;   AMPUTATION Right 05/03/2022   Procedure: RIGHT THIRD TOE AMPUTATION;  Surgeon: Nadara Mustard, MD;  [facility]: [facility];  Service: Orthopedics;  Laterality: Right;   CHOLECYSTECTOMY N/A 07/22/2014   Procedure: LAPAROSCOPIC CHOLECYSTECTOMY WITH INTRAOPERATIVE CHOLANGIOGRAM;  Surgeon: Harriette Bouillon, MD;  Location: [facility] OR;  Service: General;  Laterality: N/A;   Social History   Occupational History   Not on file  Tobacco Use   Smoking status: Some Days    Packs/day: 0.50    Years: 20.00    Total pack years: 10.00    Types: Cigars, Cigarettes   Smokeless tobacco: Never  Vaping Use   Vaping Use: Never used  Substance and Sexual Activity   Alcohol use: Yes    Alcohol/week: 3.0 standard drinks of alcohol    Types: 3 Standard drinks or equivalent per week   Drug use: No   Sexual activity: Never

## 2022-05-24 ENCOUNTER — Ambulatory Visit (INDEPENDENT_AMBULATORY_CARE_PROVIDER_SITE_OTHER): Payer: BC Managed Care – PPO | Admitting: Family

## 2022-05-24 ENCOUNTER — Encounter: Payer: Self-pay | Admitting: Family

## 2022-05-24 DIAGNOSIS — Z89421 Acquired absence of other right toe(s): Secondary | ICD-10-CM

## 2022-05-24 MED ORDER — SILVER SULFADIAZINE 1 % EX CREA: 1.0000 | TOPICAL_CREAM | Freq: Every day | CUTANEOUS | 0 refills | Status: DC

## 2022-05-24 NOTE — Addendum Note (Signed)
Addended by: Barnie Del R on: 05/24/2022 02:53 PM   Modules accepted: Orders

## 2022-05-24 NOTE — Progress Notes (Signed)
   Post-Op Visit Note   Patient: Evan Hamilton           Date of Birth: [birth year]           MRN: [id number] Visit Date: 05/24/2022 PCP: Patient, No Pcp Per  Chief Complaint:  Chief Complaint  Patient presents with   Right Foot - Routine Post Op    05/03/22 right 3rd toe amputation    HPI:  HPI The patient is a 56 year old gentleman seen status post third toe amputation June 14.  He has been full weightbearing in postop Ortho Exam On examination of the right foot the incision is healing over the dorsum of his foot however there is dehiscence in the webspace.  Sutures harvested today there is gaping a length of 2 cm 5 mm wide  this does not probe scant serous drainage  Visit Diagnoses: No diagnosis found.  Plan: Continue daily Dial soap cleansing.  Pack open with gauze. Minimize weight bearing.  Follow-Up Instructions: Return in about 2 weeks (around 06/07/2022).   Imaging: No results found.  Orders:  No orders of the defined types were placed in this encounter.  No orders of the defined types were placed in this encounter.    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   Osteomyelitis of great toe of left foot (HCC)    Osteomyelitis of ankle or foot, acute, left (HCC) 05/21/2021   Open fracture of left great toe 05/21/2021   AKI (acute kidney injury) (HCC) 11/25/2016   Pyelonephritis 11/25/2016   Acute biliary pancreatitis 07/18/2014   Past Medical History:  Diagnosis Date   Allergy    Anxiety and depression    Diabetes mellitus without complication (HCC)    Sleep apnea    does not wear CPAP    Family History  Problem Relation Age of Onset   Cancer Mother        Lung   Diabetes Mother    Heart disease Father    Hyperlipidemia Father    Hypertension Father    Mental illness Sister    Cancer Maternal Grandmother        Lung    Diabetes Maternal Grandmother     Past Surgical History:  Procedure Laterality Date   AMPUTATION Left 05/22/2021   Procedure: GREAT TOE AMPUTATION;  Surgeon: Kathryne Hitch, MD;  Location: [facility];  Service: Orthopedics;  Laterality: Left;   AMPUTATION Right 05/03/2022   Procedure: RIGHT THIRD TOE AMPUTATION;  Surgeon: Nadara Mustard, MD;  Location: [facility] OR;  Service: Orthopedics;  Laterality: Right;   CHOLECYSTECTOMY N/A 07/22/2014   Procedure: LAPAROSCOPIC CHOLECYSTECTOMY WITH INTRAOPERATIVE CHOLANGIOGRAM;  Surgeon: Harriette Bouillon, MD;  Location: [facility];  Service: General;  Laterality: N/A;   Social History   Occupational History   Not on file  Tobacco Use   Smoking status: Some Days    Packs/day: 0.50    Years: 20.00    Total pack years: 10.00    Types: Cigars, Cigarettes   Smokeless tobacco: Never  Vaping Use   Vaping Use: Never used  Substance and Sexual Activity   Alcohol use: Yes    Alcohol/week: 3.0 standard drinks of alcohol    Types: 3 Standard drinks or equivalent per week   Drug use: No   Sexual activity: Never

## 2022-05-28 ENCOUNTER — Other Ambulatory Visit: Payer: Self-pay | Admitting: Orthopedic Surgery

## 2022-06-07 ENCOUNTER — Ambulatory Visit (INDEPENDENT_AMBULATORY_CARE_PROVIDER_SITE_OTHER): Payer: BC Managed Care – PPO | Admitting: Family

## 2022-06-07 ENCOUNTER — Encounter: Payer: Self-pay | Admitting: Family

## 2022-06-07 DIAGNOSIS — Z89421 Acquired absence of other right toe(s): Secondary | ICD-10-CM

## 2022-06-07 NOTE — Progress Notes (Signed)
   Post-Op Visit Note   Patient: Evan Hamilton           Date of Birth: 10/25/1966           MRN: [id number] Visit Date: 06/07/2022 PCP: Patient, No Pcp Per  Chief Complaint: No chief complaint on file.   HPI:  HPI The patient is a 56 year old gentleman seen status post right third toe amputation he has been packing the dehisced area in his webspace open with gauze trying to minimize his weightbearing he is using Silvadene as well Ortho Exam On examination of the right foot there is excellent improvement in the wound itself its now 15 mm in length open 5 mm in width this does not probe to bone or tendon there is no active drainage no surrounding erythema no maceration  Visit Diagnoses: No diagnosis found.  Plan: Continue daily Dial soap cleansing packing open with Silvadene and gauze.  No submerging please do not swim or go barefoot  Follow-Up Instructions: No follow-ups on file.   Imaging: No results found.  Orders:  No orders of the defined types were placed in this encounter.  No orders of the defined types were placed in this encounter.    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   Osteomyelitis of great toe of left foot (HCC)    Osteomyelitis of ankle or foot, acute, left (HCC) 05/21/2021   Open fracture of left great toe 05/21/2021   AKI (acute kidney injury) (HCC) 11/25/2016   Pyelonephritis 11/25/2016   Acute biliary pancreatitis 07/18/2014   Past Medical History:  Diagnosis Date   Allergy    Anxiety and depression    Diabetes mellitus without complication (HCC)    Sleep apnea    does not wear CPAP    Family History  Problem Relation Age of Onset   Cancer Mother        Lung   Diabetes Mother    Heart disease Father    Hyperlipidemia Father    Hypertension Father    Mental illness Sister    Cancer Maternal Grandmother         Lung   Diabetes Maternal Grandmother     Past Surgical History:  Procedure Laterality Date   AMPUTATION Left 05/22/2021   Procedure: GREAT TOE AMPUTATION;  Surgeon: Kathryne Hitch, MD;  Location: [facility];  Service: Orthopedics;  Laterality: Left;   AMPUTATION Right 05/03/2022   Procedure: RIGHT THIRD TOE AMPUTATION;  Surgeon: Nadara Mustard, MD;  Location: [facility] OR;  Service: Orthopedics;  Laterality: Right;   CHOLECYSTECTOMY N/A 07/22/2014   Procedure: LAPAROSCOPIC CHOLECYSTECTOMY WITH INTRAOPERATIVE CHOLANGIOGRAM;  Surgeon: Harriette Bouillon, MD;  Location: [facility];  Service: General;  Laterality: N/A;   Social History   Occupational History   Not on file  Tobacco Use   Smoking status: Some Days    Packs/day: 0.50    Years: 20.00    Total pack years: 10.00    Types: Cigars, Cigarettes   Smokeless tobacco: Never  Vaping Use   Vaping Use: Never used  Substance and Sexual Activity   Alcohol use: Yes    Alcohol/week: 3.0 standard drinks of alcohol    Types: 3 Standard drinks or equivalent per week   Drug use: No   Sexual activity: Never

## 2022-06-21 ENCOUNTER — Ambulatory Visit (INDEPENDENT_AMBULATORY_CARE_PROVIDER_SITE_OTHER): Payer: BC Managed Care – PPO | Admitting: Family

## 2022-06-21 ENCOUNTER — Encounter: Payer: Self-pay | Admitting: Family

## 2022-06-21 DIAGNOSIS — Z89421 Acquired absence of other right toe(s): Secondary | ICD-10-CM

## 2022-06-21 NOTE — Progress Notes (Signed)
Post-Op Visit Note   Patient: Evan Hamilton           Date of Birth: Jan 13, 1966           MRN: [id number] Visit Date: 06/21/2022 PCP: Patient, No Pcp Per  Chief Complaint:  Chief Complaint  Patient presents with   Right Foot - Routine Post Op    05/03/22 right 3rd toe amputation     HPI:  HPI The patient is a 56 year old gentleman seen status post right third toe amputation. he has been packing the dehisced area in his webspace open with gauze trying to minimize his weightbearing he is using Silvadene as well.  Concerned for discoloration over lateral column  Ortho Exam On examination of the right foot there is excellent improvement in the wound itself its now 15 mm in length open 3 mm in width, full granulation. this does not probe to bone or tendon there is no active drainage no surrounding erythema no maceration Over 5th mT head there is fluid filled blister, serous fluid, discoloration from pressure injury. No erythema or warmth. Visit Diagnoses: No diagnosis found.  Plan: Continue daily Dial soap cleansing packing open with Silvadene and gauze.  Discussed changing wrapping foot to keep pressure off lateral column. Minimize weight bearing and pressure to lesser toes and lateral column.  Using wide foot wear.  Follow-Up Instructions: No follow-ups on file.   Imaging: No results found.  Orders:  No orders of the defined types were placed in this encounter.  No orders of the defined types were placed in this encounter.    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   Osteomyelitis of great toe of left foot (HCC)    Osteomyelitis of ankle or foot, acute, left (HCC) 05/21/2021   Open fracture of left great toe 05/21/2021   AKI (acute kidney injury) (HCC) 11/25/2016   Pyelonephritis 11/25/2016   Acute biliary pancreatitis 07/18/2014   Past  Medical History:  Diagnosis Date   Allergy    Anxiety and depression    Diabetes mellitus without complication (HCC)    Sleep apnea    does not wear CPAP    Family History  Problem Relation Age of Onset   Cancer Mother        Lung   Diabetes Mother    Heart disease Father    Hyperlipidemia Father    Hypertension Father    Mental illness Sister    Cancer Maternal Grandmother        Lung   Diabetes Maternal Grandmother     Past Surgical History:  Procedure Laterality Date   AMPUTATION Left 05/22/2021   Procedure: GREAT TOE AMPUTATION;  Surgeon: Kathryne Hitch, MD;  Location: [facility];  Service: Orthopedics;  Laterality: Left;   AMPUTATION Right 05/03/2022   Procedure: RIGHT THIRD TOE AMPUTATION;  Surgeon: Nadara Mustard, MD;  Location: [facility] OR;  Service: Orthopedics;  Laterality: Right;   CHOLECYSTECTOMY N/A 07/22/2014   Procedure: LAPAROSCOPIC CHOLECYSTECTOMY WITH INTRAOPERATIVE CHOLANGIOGRAM;  Surgeon: Harriette Bouillon, MD;  Location: [facility];  Service: General;  Laterality: N/A;   Social History   Occupational History   Not on file  Tobacco Use   Smoking status: Some Days    Packs/day: 0.50    Years: 20.00    Total pack years: 10.00    Types: Cigars, Cigarettes  Smokeless tobacco: Never  Vaping Use   Vaping Use: Never used  Substance and Sexual Activity   Alcohol use: Yes    Alcohol/week: 3.0 standard drinks of alcohol    Types: 3 Standard drinks or equivalent per week   Drug use: No   Sexual activity: Never

## 2022-07-12 ENCOUNTER — Ambulatory Visit (INDEPENDENT_AMBULATORY_CARE_PROVIDER_SITE_OTHER): Payer: BC Managed Care – PPO | Admitting: Family

## 2022-07-12 ENCOUNTER — Encounter: Payer: Self-pay | Admitting: Family

## 2022-07-12 DIAGNOSIS — Z89421 Acquired absence of other right toe(s): Secondary | ICD-10-CM

## 2022-07-12 NOTE — Progress Notes (Signed)
Post-Op Visit Note   Patient: Evan Hamilton           Date of Birth: 27-Sep-1966           MRN: [id number] Visit Date: 07/12/2022 PCP: Patient, No Pcp Per  Chief Complaint:  Chief Complaint  Patient presents with   Right Foot - Routine Post Op    05/03/22 right 3rd toe amputation      HPI:  HPI Patient is a 56 year old gentleman seen status post right third toe amputation and partial ray amputation has been packing his wound open with Silvadene.  Ortho Exam On examination of the amputation site this is much improved there is 1 remaining open area this is 6 mm in length 1 mm in width there is no depth this does not probe there is scant serosanguineous drainage no surrounding erythema  Visit Diagnoses: No diagnosis found.  Plan: He will continue with daily Dial soap cleansing he may advance to dry dressings follow-up in 3 weeks  Follow-Up Instructions: Return in about 3 weeks (around 08/02/2022).   Imaging: No results found.  Orders:  No orders of the defined types were placed in this encounter.  No orders of the defined types were placed in this encounter.    PMFS History: Patient Active Problem List   Diagnosis Date Noted   History of amputation of lesser toe of right foot (HCC) 06/21/2022   Gangrene of right foot (HCC) 05/01/2022   Status post amputation of great toe, left (HCC) 06/06/2021   Osteomyelitis of great toe of left foot (HCC)    Osteomyelitis of ankle or foot, acute, left (HCC) 05/21/2021   Open fracture of left great toe 05/21/2021   AKI (acute kidney injury) (HCC) 11/25/2016   Pyelonephritis 11/25/2016   Acute biliary pancreatitis 07/18/2014   Past Medical History:  Diagnosis Date   Allergy    Anxiety and depression    Diabetes mellitus without complication (HCC)    Sleep apnea    does not wear CPAP    Family History  Problem Relation Age of Onset   Cancer Mother        Lung   Diabetes Mother    Heart disease Father    Hyperlipidemia  Father    Hypertension Father    Mental illness Sister    Cancer Maternal Grandmother        Lung   Diabetes Maternal Grandmother     Past Surgical History:  Procedure Laterality Date   AMPUTATION Left 05/22/2021   Procedure: GREAT TOE AMPUTATION;  Surgeon: Kathryne Hitch, MD;  Location: [facility];  Service: Orthopedics;  Laterality: Left;   AMPUTATION Right 05/03/2022   Procedure: RIGHT THIRD TOE AMPUTATION;  Surgeon: Nadara Mustard, MD;  Location: [facility] OR;  Service: Orthopedics;  Laterality: Right;   CHOLECYSTECTOMY N/A 07/22/2014   Procedure: LAPAROSCOPIC CHOLECYSTECTOMY WITH INTRAOPERATIVE CHOLANGIOGRAM;  Surgeon: Harriette Bouillon, MD;  Location: [facility] OR;  Service: General;  Laterality: N/A;   Social History   Occupational History   Not on file  Tobacco Use   Smoking status: Some Days    Packs/day: 0.50    Years: 20.00    Total pack years: 10.00    Types: Cigars, Cigarettes   Smokeless tobacco: Never  Vaping Use   Vaping Use: Never used  Substance and Sexual Activity   Alcohol use: Yes    Alcohol/week: 3.0 standard drinks of alcohol    Types: 3 Standard drinks or equivalent per week  Drug use: No   Sexual activity: Never

## 2022-08-02 ENCOUNTER — Encounter: Payer: Self-pay | Admitting: Family

## 2022-08-02 ENCOUNTER — Ambulatory Visit (INDEPENDENT_AMBULATORY_CARE_PROVIDER_SITE_OTHER): Payer: BC Managed Care – PPO | Admitting: Family

## 2022-08-02 DIAGNOSIS — Z89421 Acquired absence of other right toe(s): Secondary | ICD-10-CM

## 2022-08-02 NOTE — Progress Notes (Signed)
   Post-Op Visit Note   Patient: Evan Hamilton           Date of Birth: [birth year]           MRN: [id number] Visit Date: 08/02/2022 PCP: Patient, No Pcp Per  Chief Complaint:  Chief Complaint  Patient presents with   Right Foot - Routine Post Op    05/03/22 right 3rd toe amputation      HPI:  HPI Patient is a 56 year old gentleman seen status post right third toe amputation and partial ray amputation. Feel is healed. Has stopped dressings his foot.  Ortho Exam On examination of the amputation site this is much improved there is no open area. No drainage no surrounding erythema  Visit Diagnoses: No diagnosis found.  Plan: He will advance weight bearing as tolerated. Follow up as needed.  Follow-Up Instructions: No follow-ups on file.   Imaging: No results found.  Orders:  No orders of the defined types were placed in this encounter.  No orders of the defined types were placed in this encounter.    PMFS History: Patient Active Problem List   Diagnosis Date Noted   History of amputation of lesser toe of right foot (HCC) 06/21/2022   Gangrene of right foot (HCC) 05/01/2022   Status post amputation of great toe, left (HCC) 06/06/2021   Osteomyelitis of great toe of left foot (HCC)    Osteomyelitis of ankle or foot, acute, left (HCC) 05/21/2021   Open fracture of left great toe 05/21/2021   AKI (acute kidney injury) (HCC) 11/25/2016   Pyelonephritis 11/25/2016   Acute biliary pancreatitis 07/18/2014   Past Medical History:  Diagnosis Date   Allergy    Anxiety and depression    Diabetes mellitus without complication (HCC)    Sleep apnea    does not wear CPAP    Family History  Problem Relation Age of Onset   Cancer Mother        Lung   Diabetes Mother    Heart disease Father    Hyperlipidemia Father    Hypertension Father    Mental illness Sister    Cancer Maternal Grandmother        Lung   Diabetes Maternal Grandmother     Past Surgical History:   Procedure Laterality Date   AMPUTATION Left 05/22/2021   Procedure: GREAT TOE AMPUTATION;  Surgeon: Kathryne Hitch, MD;  Location: [facility] OR;  Service: Orthopedics;  Laterality: Left;   AMPUTATION Right 05/03/2022   Procedure: RIGHT THIRD TOE AMPUTATION;  Surgeon: Nadara Mustard, MD;  Location: [facility] OR;  Service: Orthopedics;  Laterality: Right;   CHOLECYSTECTOMY N/A 07/22/2014   Procedure: LAPAROSCOPIC CHOLECYSTECTOMY WITH INTRAOPERATIVE CHOLANGIOGRAM;  Surgeon: Harriette Bouillon, MD;  Location: [facility] OR;  Service: General;  Laterality: N/A;   Social History   Occupational History   Not on file  Tobacco Use   Smoking status: Some Days    Packs/day: 0.50    Years: 20.00    Total pack years: 10.00    Types: Cigars, Cigarettes   Smokeless tobacco: Never  Vaping Use   Vaping Use: Never used  Substance and Sexual Activity   Alcohol use: Yes    Alcohol/week: 3.0 standard drinks of alcohol    Types: 3 Standard drinks or equivalent per week   Drug use: No   Sexual activity: Never

## 2023-09-11 DIAGNOSIS — H40053 Ocular hypertension, bilateral: Secondary | ICD-10-CM | POA: Diagnosis not present

## 2023-11-16 ENCOUNTER — Encounter (HOSPITAL_COMMUNITY): Payer: Self-pay

## 2023-11-16 ENCOUNTER — Emergency Department (HOSPITAL_COMMUNITY): Payer: BC Managed Care – PPO

## 2023-11-16 ENCOUNTER — Other Ambulatory Visit: Payer: Self-pay

## 2023-11-16 ENCOUNTER — Emergency Department (HOSPITAL_COMMUNITY)
Admission: EM | Admit: 2023-11-16 | Discharge: 2023-11-17 | Disposition: A | Discharge status: Home / Self Care | Payer: BC Managed Care – PPO | Attending: Emergency Medicine | Admitting: Emergency Medicine

## 2023-11-16 DIAGNOSIS — Z5329 Procedure and treatment not carried out because of patient's decision for other reasons: Secondary | ICD-10-CM | POA: Insufficient documentation

## 2023-11-16 DIAGNOSIS — L97518 Non-pressure chronic ulcer of other part of right foot with other specified severity: Secondary | ICD-10-CM | POA: Diagnosis not present

## 2023-11-16 DIAGNOSIS — M7989 Other specified soft tissue disorders: Secondary | ICD-10-CM | POA: Diagnosis not present

## 2023-11-16 DIAGNOSIS — M19071 Primary osteoarthritis, right ankle and foot: Secondary | ICD-10-CM | POA: Diagnosis not present

## 2023-11-16 DIAGNOSIS — M86171 Other acute osteomyelitis, right ankle and foot: Secondary | ICD-10-CM | POA: Insufficient documentation

## 2023-11-16 DIAGNOSIS — Z89421 Acquired absence of other right toe(s): Secondary | ICD-10-CM | POA: Diagnosis not present

## 2023-11-16 DIAGNOSIS — S91301A Unspecified open wound, right foot, initial encounter: Secondary | ICD-10-CM | POA: Diagnosis not present

## 2023-11-16 DIAGNOSIS — D72829 Elevated white blood cell count, unspecified: Secondary | ICD-10-CM | POA: Insufficient documentation

## 2023-11-16 DIAGNOSIS — L03119 Cellulitis of unspecified part of limb: Secondary | ICD-10-CM | POA: Diagnosis not present

## 2023-11-16 DIAGNOSIS — L03115 Cellulitis of right lower limb: Secondary | ICD-10-CM | POA: Diagnosis not present

## 2023-11-16 DIAGNOSIS — L089 Local infection of the skin and subcutaneous tissue, unspecified: Secondary | ICD-10-CM | POA: Diagnosis not present

## 2023-11-16 DIAGNOSIS — E1169 Type 2 diabetes mellitus with other specified complication: Secondary | ICD-10-CM | POA: Diagnosis not present

## 2023-11-16 LAB — CBC WITH DIFFERENTIAL/PLATELET
Abs Immature Granulocytes: 0.09 10*3/uL — ABNORMAL HIGH (ref 0.00–0.07)
Basophils Absolute: 0.1 10*3/uL (ref 0.0–0.1)
Basophils Relative: 1 %
Eosinophils Absolute: 0.2 10*3/uL (ref 0.0–0.5)
Eosinophils Relative: 1 %
HCT: 46.4 % (ref 39.0–52.0)
Hemoglobin: 15.7 g/dL (ref 13.0–17.0)
Immature Granulocytes: 1 %
Lymphocytes Relative: 18 %
Lymphs Abs: 2.7 10*3/uL (ref 0.7–4.0)
MCH: 31 pg (ref 26.0–34.0)
MCHC: 33.8 g/dL (ref 30.0–36.0)
MCV: 91.7 fL (ref 80.0–100.0)
Monocytes Absolute: 1.2 10*3/uL — ABNORMAL HIGH (ref 0.1–1.0)
Monocytes Relative: 8 %
Neutro Abs: 11 10*3/uL — ABNORMAL HIGH (ref 1.7–7.7)
Neutrophils Relative %: 71 %
Platelets: 147 10*3/uL — ABNORMAL LOW (ref 150–400)
RBC: 5.06 MIL/uL (ref 4.22–5.81)
RDW: 12.8 % (ref 11.5–15.5)
WBC: 15.3 10*3/uL — ABNORMAL HIGH (ref 4.0–10.5)
nRBC: 0 % (ref 0.0–0.2)

## 2023-11-16 LAB — COMPREHENSIVE METABOLIC PANEL
ALT: 20 U/L (ref 0–44)
AST: 19 U/L (ref 15–41)
Albumin: 3.9 g/dL (ref 3.5–5.0)
Alkaline Phosphatase: 59 U/L (ref 38–126)
Anion gap: 10 (ref 5–15)
BUN: 39 mg/dL — ABNORMAL HIGH (ref 6–20)
CO2: 22 mmol/L (ref 22–32)
Calcium: 9.4 mg/dL (ref 8.9–10.3)
Chloride: 104 mmol/L (ref 98–111)
Creatinine, Ser: 0.88 mg/dL (ref 0.61–1.24)
GFR, Estimated: >60 mL/min (ref >60)
Glucose, Bld: 131 mg/dL — ABNORMAL HIGH (ref 70–99)
Potassium: 3.8 mmol/L (ref 3.5–5.1)
Sodium: 136 mmol/L (ref 135–145)
Total Bilirubin: 0.9 mg/dL (ref <1.2)
Total Protein: 8.4 g/dL — ABNORMAL HIGH (ref 6.5–8.1)

## 2023-11-16 LAB — I-STAT CG4 LACTIC ACID, ED: Lactic Acid, Venous: 0.8 mmol/L (ref 0.5–1.9)

## 2023-11-16 MED ORDER — VANCOMYCIN HCL 2000 MG/400ML IV SOLN
2000.0000 mg | Freq: Once | INTRAVENOUS | Status: AC
  Administered 2023-11-17: 2000 mg via INTRAVENOUS
  Filled 2023-11-16: qty 400

## 2023-11-16 MED ORDER — VANCOMYCIN HCL IN DEXTROSE 1-5 GM/200ML-% IV SOLN: 1000.0000 mg | Freq: Once | INTRAVENOUS | Status: DC

## 2023-11-16 MED ORDER — SODIUM CHLORIDE 0.9 % IV SOLN
2.0000 g | Freq: Once | INTRAVENOUS | Status: AC
  Administered 2023-11-16: 2 g via INTRAVENOUS
  Filled 2023-11-16: qty 12.5

## 2023-11-16 NOTE — ED Provider Notes (Signed)
[place] EMERGENCY DEPARTMENT AT [facility] Provider Note   CSN: [id number] Arrival date & time: 11/16/23  2145     History {Add pertinent medical, surgical, social history, OB history to HPI:1} Chief Complaint  Patient presents with   Foot Injury    Evan Hamilton is a 57 y.o. male.  Patient to ED with worsening ulceration to the right lateral foot that started one month ago. He was on clindamycin until about one week ago. Since then, the area has increased in swelling, with redness extending to the distal lower leg. No fever. History of neuropathy so denies any pain sensation in the foot. 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   The history is provided by the patient. No language interpreter was used.  Foot Injury      Home Medications Prior to Admission medications   Medication Sig Start Date End Date Taking? Authorizing Provider  acetaminophen (TYLENOL) 500 MG tablet Take 1,500 mg by mouth every 8 (eight) hours as needed for moderate pain.    [provider]  aspirin-sod bicarb-citric acid (ALKA-SELTZER) 325 MG TBEF tablet Take 650 mg by mouth every 6 (six) hours as needed (indigestion).    [provider]  doxycycline (VIBRA-TABS) 100 MG tablet Take 1 tablet (100 mg total) by mouth 2 (two) times daily. 05/01/22   Nadara Mustard, MD  Ginkgo Biloba 120 MG CAPS Take 120 mg by mouth daily.    [provider]  HYDROcodone-acetaminophen (NORCO/VICODIN) 5-325 MG tablet Take 1 tablet by mouth every 4 (four) hours as needed. 05/03/22   Nadara Mustard, MD  ibuprofen (ADVIL) 200 MG tablet Take 600 mg by mouth every 6 (six) hours as needed for moderate pain.    [provider]  loratadine (CLARITIN) 10 MG tablet Take 10 mg by mouth every morning.    [provider]  Multiple Vitamin (MULTIVITAMIN WITH MINERALS) TABS tablet Take 1 tablet by mouth every morning.    [provider]  silver sulfADIAZINE  (SILVADENE) 1 % cream Apply 1 Application topically daily. 05/24/22   Adonis Huguenin, NP      Allergies    Penicillins    Review of Systems   Review of Systems  Physical Exam Updated Vital Signs BP 123/84 (BP Location: Left Arm)   Pulse (!) 114   Temp 99.2 F (37.3 C) (Oral)   Resp 17   Ht 6\' 2"  (1.88 m)   Wt (!) 140.6 kg   SpO2 98%   BMI 39.80 kg/m  Physical Exam Constitutional:      Appearance: He is well-developed.  Cardiovascular:     Rate and Rhythm: Tachycardia present.  Pulmonary:     Effort: Pulmonary effort is normal.  Musculoskeletal:        General: Normal range of motion.     Cervical back: Normal range of motion.     Comments: Right foot swelling and erythema extending to lower leg. Warm to touch.   Skin:    General: Skin is warm and dry.     Findings: Erythema and lesion present.     Comments: Open ulceration to lateral left foot at midshaft metatarsal. Malodorous. Bloody drainage noted to bandage, through to sock.   Neurological:     Mental Status: He is alert and oriented to person, place, and time.     Sensory: Sensory deficit present.     ED Results / Procedures / Treatments   Labs (all labs ordered are listed,  but only abnormal results are displayed) Labs Reviewed  CULTURE, BLOOD (ROUTINE X 2)  CULTURE, BLOOD (ROUTINE X 2)  COMPREHENSIVE METABOLIC PANEL  CBC WITH DIFFERENTIAL/PLATELET  HIV ANTIBODY (ROUTINE TESTING W REFLEX)  I-STAT CG4 LACTIC ACID, ED    EKG None  Radiology DG Foot Complete Right Result Date: 11/16/2023 CLINICAL DATA:  Lateral foot wound EXAM: RIGHT FOOT COMPLETE - 3+ VIEW COMPARISON:  04/30/2022 FINDINGS: There has been interval amputation of the third toe at the level of the mid metatarsal. Soft tissue swelling is noted over the base of the fifth metatarsal with a skin wound identified consistent with the given clinical history. Mild lucency is noted which may represent some early osteomyelitis. MRI would be more  sensitive in this regard. No other acute bony abnormality is noted. Tarsal degenerative changes are seen. IMPRESSION: Soft tissue wound over the base of the fifth metatarsal with mild irregularity suspicious for early osteomyelitis. Electronically Signed   By: Alcide Clever M.D.   On: 11/16/2023 22:49    Procedures Procedures  {Document cardiac monitor, telemetry assessment procedure when appropriate:1}  Medications Ordered in ED Medications  vancomycin (VANCOCIN) IVPB 1000 mg/200 mL premix (has no administration in time range)  ceFEPIme (MAXIPIME) 1 g in sodium chloride 0.9 % 100 mL IVPB (has no administration in time range)    ED Course/ Medical Decision Making/ A&P   {   Click here for ABCD2, HEART and other calculatorsREFRESH Note before signing :1}                              Medical Decision Making This patient presents to the ED for concern of foot infection, this involves an extensive number of treatment options, and is a complaint that carries with it a high risk of complications and morbidity.  The differential diagnosis includes recurrent osteomyelitis, cellulitis, clot   Co morbidities that complicate the patient evaluation  Nondiabetic Neuropathy, h/o osteomyelitis requiring amputation,    Additional history obtained:  Additional history and/or information obtained from chart review, notable for Previous charts - last episode osteomyelitis infection 04/2023 resulting in right middle toe amputation Lajoyce Corners)   Lab Tests:  I Ordered, and personally interpreted labs.  The pertinent results include:  ***    Imaging Studies ordered:  I ordered imaging studies including *** I independently visualized and interpreted imaging which showed *** I agree with the radiologist interpretation   Cardiac Monitoring:  The patient was maintained on a cardiac monitor.  I personally viewed and interpreted the cardiac monitored which showed an underlying rhythm of:  ***   Medicines ordered and prescription drug management:  I ordered medication including ***  for *** Reevaluation of the patient after these medicines showed that the patient {resolved/improved/worsened:23923::"improved"} I have reviewed the patients home medicines and have made adjustments as needed   Test Considered:  ***   Critical Interventions:  ***   Consultations Obtained:  I requested consultation with the ***,  and discussed lab and imaging findings as well as pertinent plan - they recommend: ***   Problem List / ED Course:  ***   Reevaluation:  After the interventions noted above, I reevaluated the patient and found that they have :{resolved/improved/worsened:23923::"improved"}   Social Determinants of Health:  ***   Disposition:  After consideration of the diagnostic results and the patients response to treatment, I feel that the patient would benefit from ***.   Risk Prescription drug management.   ***  {  Document critical care time when appropriate:1} {Document review of labs and clinical decision tools ie heart score, Chads2Vasc2 etc:1}  {Document your independent review of radiology images, and any outside records:1} {Document your discussion with family members, caretakers, and with consultants:1} {Document social determinants of health affecting pt's care:1} {Document your decision making why or why not admission, treatments were needed:1} Final Clinical Impression(s) / ED Diagnoses Final diagnoses:  None    Rx / DC Orders ED Discharge Orders     None

## 2023-11-16 NOTE — ED Triage Notes (Signed)
Pt to ED by POV from home with c/o of a wound to the lateral side of the right foot. Pt states he was seen 12/2 for the same and prescribed antibiotics, but that he feels it has gotten worse again, denies any pain. Arrives A+O, VSS, NADN.

## 2023-11-16 NOTE — ED Provider Triage Note (Signed)
Emergency Medicine Provider Triage Evaluation Note  Evan Hamilton , a 57 y.o. male  was evaluated in triage.  Pt complains of foot infection. Report having an infected blister to R foot ongoing for the past 1-2 months.  Was better with abx a month ago but now worse.  No fever, chills, body aches, n/v. No trauma.  Hx of osteomyelitis  Review of Systems  Positive: As above Negative: As above  Physical Exam  BP 123/84 (BP Location: Left Arm)   Pulse (!) 114   Temp 99.2 F (37.3 C) (Oral)   Resp 17   Ht 6\' 2"  (1.88 m)   Wt (!) 140.6 kg   SpO2 98%   BMI 39.80 kg/m  Gen:   Awake, no distress   Resp:  Normal effort  MSK:   Moves extremities without difficulty  Other:    Medical Decision Making  Medically screening exam initiated at 10:26 PM.  Appropriate orders placed.  Schon Etheredge was informed that the remainder of the evaluation will be completed by another provider, this initial triage assessment does not replace that evaluation, and the importance of remaining in the ED until their evaluation is complete.     Fayrene Helper, PA-C 11/16/23 2227

## 2023-11-17 LAB — HIV ANTIBODY (ROUTINE TESTING W REFLEX): HIV Screen 4th Generation wRfx: NONREACTIVE

## 2023-11-17 MED ORDER — CLINDAMYCIN HCL 150 MG PO CAPS: 450.0000 mg | ORAL_CAPSULE | Freq: Four times a day (QID) | ORAL | 0 refills | Status: AC

## 2023-11-17 NOTE — Discharge Instructions (Signed)
Call Dr. Audrie Lia office in the morning and let them know you need to be seen in the office when he returns on Tuesday (per Dr. Ophelia Charter)  It is important to take the antibiotic as prescribed.   We have recommended admission to the hospital for treatment of the bone infection in your right foot, however, you have declined admission. Please return to the hospital at any time if you change your mind about this course of treatment.

## 2023-11-17 NOTE — ED Notes (Signed)
Soaked pts foot with heavy diluted iodine and wrapped in vaseline guaze, non adherent guaze pad and kerlix.

## 2023-11-20 ENCOUNTER — Ambulatory Visit: Payer: BC Managed Care – PPO | Admitting: Orthopedic Surgery

## 2023-11-20 DIAGNOSIS — I96 Gangrene, not elsewhere classified: Secondary | ICD-10-CM | POA: Diagnosis not present

## 2023-11-20 DIAGNOSIS — Z89421 Acquired absence of other right toe(s): Secondary | ICD-10-CM | POA: Diagnosis not present

## 2023-11-22 ENCOUNTER — Other Ambulatory Visit: Payer: Self-pay

## 2023-11-22 ENCOUNTER — Encounter (HOSPITAL_COMMUNITY): Payer: Self-pay | Admitting: Orthopedic Surgery

## 2023-11-22 LAB — CULTURE, BLOOD (ROUTINE X 2)
Culture: NO GROWTH
Culture: NO GROWTH

## 2023-11-22 NOTE — Progress Notes (Signed)
 PCP - none Cardiologist - none  Chest x-ray - 07/10/14 EKG - 05/13/22 Stress Test - n/a ECHO - n/a Cardiac Cath - n/a  ICD Pacemaker/Loop - n/a  Sleep Study -  Yes, 2017 CPAP - does not use CPAP  Diabetes Type 2, no meds, does not check blood sugar, lost weight.  Blood Thinner Instructions:  n/a  Aspirin Instructions: n/a  ERAS Protcol - Clear liquids til 8:30 AM DOS  Anesthesia review: Yes, abnormal labs on 11/16/23  STOP now taking any Aspirin (unless otherwise instructed by your surgeon), Aleve, Naproxen, Ibuprofen, Motrin, Advil, Goody's, BC's, all herbal medications, fish oil, and all vitamins.   Coronavirus Screening Do you have any of the following symptoms:  Cough yes/no: No Fever (>100.48F)  yes/no: No Runny nose yes/no: No Sore throat yes/no: No Difficulty breathing/shortness of breath  yes/no: No  Have you traveled in the last 14 days and where? yes/no: No  Patient verbalized understanding of instructions that were given via phone.

## 2023-11-23 ENCOUNTER — Ambulatory Visit (HOSPITAL_COMMUNITY): Payer: BC Managed Care – PPO | Admitting: Physician Assistant

## 2023-11-23 ENCOUNTER — Encounter (HOSPITAL_COMMUNITY): Payer: Self-pay | Admitting: Orthopedic Surgery

## 2023-11-23 ENCOUNTER — Ambulatory Visit (HOSPITAL_COMMUNITY)
Admission: RE | Admit: 2023-11-23 | Discharge: 2023-11-23 | Disposition: A | Discharge status: Home / Self Care | Payer: BC Managed Care – PPO | Attending: Orthopedic Surgery | Admitting: Orthopedic Surgery

## 2023-11-23 ENCOUNTER — Other Ambulatory Visit (HOSPITAL_COMMUNITY): Payer: Self-pay

## 2023-11-23 ENCOUNTER — Encounter (HOSPITAL_COMMUNITY)
Admission: RE | Disposition: A | Discharge status: Home / Self Care | Payer: Self-pay | Source: Home / Self Care | Attending: Orthopedic Surgery

## 2023-11-23 ENCOUNTER — Other Ambulatory Visit: Payer: Self-pay

## 2023-11-23 DIAGNOSIS — F17290 Nicotine dependence, other tobacco product, uncomplicated: Secondary | ICD-10-CM | POA: Insufficient documentation

## 2023-11-23 DIAGNOSIS — F17210 Nicotine dependence, cigarettes, uncomplicated: Secondary | ICD-10-CM | POA: Diagnosis not present

## 2023-11-23 DIAGNOSIS — E119 Type 2 diabetes mellitus without complications: Secondary | ICD-10-CM | POA: Diagnosis not present

## 2023-11-23 DIAGNOSIS — E11621 Type 2 diabetes mellitus with foot ulcer: Secondary | ICD-10-CM | POA: Insufficient documentation

## 2023-11-23 DIAGNOSIS — E1169 Type 2 diabetes mellitus with other specified complication: Secondary | ICD-10-CM | POA: Insufficient documentation

## 2023-11-23 DIAGNOSIS — M86171 Other acute osteomyelitis, right ankle and foot: Secondary | ICD-10-CM

## 2023-11-23 DIAGNOSIS — M869 Osteomyelitis, unspecified: Secondary | ICD-10-CM | POA: Insufficient documentation

## 2023-11-23 DIAGNOSIS — G4730 Sleep apnea, unspecified: Secondary | ICD-10-CM | POA: Insufficient documentation

## 2023-11-23 DIAGNOSIS — I96 Gangrene, not elsewhere classified: Secondary | ICD-10-CM

## 2023-11-23 HISTORY — DX: Personal history of urinary calculi: Z87.442

## 2023-11-23 HISTORY — PX: AMPUTATION: SHX166

## 2023-11-23 LAB — GLUCOSE, CAPILLARY: Glucose-Capillary: 92 mg/dL (ref 70–99)

## 2023-11-23 SURGERY — AMPUTATION, FOOT, RAY
Anesthesia: General | Site: Foot | Laterality: Right

## 2023-11-23 MED ORDER — HYDROMORPHONE HCL 1 MG/ML IJ SOLN
INTRAMUSCULAR | Status: AC
  Filled 2023-11-23: qty 1

## 2023-11-23 MED ORDER — EPHEDRINE SULFATE-NACL 50-0.9 MG/10ML-% IV SOSY
PREFILLED_SYRINGE | INTRAVENOUS | Status: DC | PRN
  Administered 2023-11-23: 10 mg via INTRAVENOUS
  Administered 2023-11-23: 15 mg via INTRAVENOUS

## 2023-11-23 MED ORDER — OXYCODONE HCL 5 MG/5ML PO SOLN: 5.0000 mg | Freq: Once | ORAL | Status: DC | PRN

## 2023-11-23 MED ORDER — MIDAZOLAM HCL 2 MG/2ML IJ SOLN
INTRAMUSCULAR | Status: AC
Start: 2023-11-23 — End: ?
  Filled 2023-11-23: qty 2

## 2023-11-23 MED ORDER — CHLORHEXIDINE GLUCONATE 0.12 % MT SOLN
15.0000 mL | Freq: Once | OROMUCOSAL | Status: AC
Start: 2023-11-23 — End: 2023-11-23
  Administered 2023-11-23: 15 mL via OROMUCOSAL
  Filled 2023-11-23: qty 15

## 2023-11-23 MED ORDER — ONDANSETRON HCL 4 MG/2ML IJ SOLN
INTRAMUSCULAR | Status: AC
  Filled 2023-11-23: qty 2

## 2023-11-23 MED ORDER — LIDOCAINE 2% (20 MG/ML) 5 ML SYRINGE
INTRAMUSCULAR | Status: DC | PRN
  Administered 2023-11-23: 100 mg via INTRAVENOUS

## 2023-11-23 MED ORDER — PROPOFOL 10 MG/ML IV BOLUS
INTRAVENOUS | Status: DC | PRN
  Administered 2023-11-23: 200 mg via INTRAVENOUS

## 2023-11-23 MED ORDER — ONDANSETRON HCL 4 MG/2ML IJ SOLN
INTRAMUSCULAR | Status: DC | PRN
  Administered 2023-11-23: 4 mg via INTRAVENOUS

## 2023-11-23 MED ORDER — HYDROMORPHONE HCL 1 MG/ML IJ SOLN
0.2500 mg | INTRAMUSCULAR | Status: DC | PRN
  Administered 2023-11-23: 0.5 mg via INTRAVENOUS

## 2023-11-23 MED ORDER — ORAL CARE MOUTH RINSE: 15.0000 mL | Freq: Once | OROMUCOSAL | Status: AC

## 2023-11-23 MED ORDER — 0.9 % SODIUM CHLORIDE (POUR BTL) OPTIME
TOPICAL | Status: DC | PRN
  Administered 2023-11-23: 1000 mL

## 2023-11-23 MED ORDER — HYDROCODONE-ACETAMINOPHEN 5-325 MG PO TABS
1.0000 | ORAL_TABLET | ORAL | 0 refills | Status: DC | PRN
  Filled 2023-11-23: qty 30, 5d supply, fill #0

## 2023-11-23 MED ORDER — STERILE WATER FOR IRRIGATION IR SOLN
Status: DC | PRN
  Administered 2023-11-23: 1000 mL

## 2023-11-23 MED ORDER — OXYCODONE HCL 5 MG PO TABS: 5.0000 mg | ORAL_TABLET | Freq: Once | ORAL | Status: DC | PRN

## 2023-11-23 MED ORDER — LIDOCAINE 2% (20 MG/ML) 5 ML SYRINGE
INTRAMUSCULAR | Status: AC
  Filled 2023-11-23: qty 5

## 2023-11-23 MED ORDER — SODIUM CHLORIDE 0.9 % IV SOLN
3.0000 g | INTRAVENOUS | Status: AC
  Administered 2023-11-23: 3 g via INTRAVENOUS
  Filled 2023-11-23: qty 3

## 2023-11-23 MED ORDER — FENTANYL CITRATE (PF) 250 MCG/5ML IJ SOLN
INTRAMUSCULAR | Status: AC
  Filled 2023-11-23: qty 5

## 2023-11-23 MED ORDER — LACTATED RINGERS IV SOLN: INTRAVENOUS | Status: DC

## 2023-11-23 MED ORDER — FENTANYL CITRATE (PF) 250 MCG/5ML IJ SOLN
INTRAMUSCULAR | Status: DC | PRN
  Administered 2023-11-23: 50 ug via INTRAVENOUS
  Administered 2023-11-23: 100 ug via INTRAVENOUS

## 2023-11-23 MED ORDER — DEXAMETHASONE SODIUM PHOSPHATE 10 MG/ML IJ SOLN
INTRAMUSCULAR | Status: AC
  Filled 2023-11-23: qty 1

## 2023-11-23 MED ORDER — ONDANSETRON HCL 4 MG/2ML IJ SOLN: 4.0000 mg | Freq: Once | INTRAMUSCULAR | Status: DC | PRN

## 2023-11-23 SURGICAL SUPPLY — 30 items
BAG COUNTER SPONGE SURGICOUNT (BAG) ×1 IMPLANT
BLADE SAW SGTL MED 73X18.5 STR (BLADE) IMPLANT
BLADE SURG 21 STRL SS (BLADE) ×1 IMPLANT
BNDG COHESIVE 4X5 TAN STRL (GAUZE/BANDAGES/DRESSINGS) ×1 IMPLANT
BNDG GAUZE DERMACEA FLUFF 4 (GAUZE/BANDAGES/DRESSINGS) ×1 IMPLANT
CANISTER PREVENA PLUS 150 (CANNISTER) IMPLANT
CLEANSER WND VASHE 34 (WOUND CARE) IMPLANT
COVER SURGICAL LIGHT HANDLE (MISCELLANEOUS) ×2 IMPLANT
DRAPE DERMATAC (DRAPES) IMPLANT
DRAPE U-SHAPE 47X51 STRL (DRAPES) ×2 IMPLANT
DRSG ADAPTIC 3X8 NADH LF (GAUZE/BANDAGES/DRESSINGS) ×1 IMPLANT
DURAPREP 26ML APPLICATOR (WOUND CARE) ×1 IMPLANT
ELECT REM PT RETURN 9FT ADLT (ELECTROSURGICAL) ×1 IMPLANT
ELECTRODE REM PT RTRN 9FT ADLT (ELECTROSURGICAL) ×1 IMPLANT
GAUZE PAD ABD 8X10 STRL (GAUZE/BANDAGES/DRESSINGS) ×2 IMPLANT
GAUZE SPONGE 4X4 12PLY STRL (GAUZE/BANDAGES/DRESSINGS) ×1 IMPLANT
GLOVE BIOGEL PI IND STRL 9 (GLOVE) ×1 IMPLANT
GLOVE SURG ORTHO 9.0 STRL STRW (GLOVE) ×1 IMPLANT
GOWN STRL REUS W/ TWL XL LVL3 (GOWN DISPOSABLE) ×2 IMPLANT
KIT BASIN OR (CUSTOM PROCEDURE TRAY) ×1 IMPLANT
KIT PREVENA INCISION MGT 13 (CANNISTER) IMPLANT
KIT TURNOVER KIT B (KITS) ×1 IMPLANT
NS IRRIG 1000ML POUR BTL (IV SOLUTION) ×1 IMPLANT
PACK ORTHO EXTREMITY (CUSTOM PROCEDURE TRAY) ×1 IMPLANT
PAD ARMBOARD 7.5X6 YLW CONV (MISCELLANEOUS) ×2 IMPLANT
STOCKINETTE IMPERVIOUS LG (DRAPES) IMPLANT
SUT ETHILON 2 0 PSLX (SUTURE) ×1 IMPLANT
TOWEL GREEN STERILE (TOWEL DISPOSABLE) ×1 IMPLANT
TUBE CONNECTING 12X1/4 (SUCTIONS) ×1 IMPLANT
YANKAUER SUCT BULB TIP NO VENT (SUCTIONS) ×1 IMPLANT

## 2023-11-23 NOTE — Anesthesia Postprocedure Evaluation (Signed)
 Anesthesia Post Note  Patient: [name]  Procedure(s) Performed: RIGHT 5TH RAY AMPUTATION (Right: Foot)     Patient location during evaluation: PACU Anesthesia Type: General Level of consciousness: awake and alert and oriented Pain management: pain level controlled Vital Signs Assessment: post-procedure vital signs reviewed and stable Respiratory status: spontaneous breathing, nonlabored ventilation and respiratory function stable Cardiovascular status: blood pressure returned to baseline and stable Postop Assessment: no apparent nausea or vomiting Anesthetic complications: no   No notable events documented.  Last Vitals:  Vitals:   11/23/23 1315 11/23/23 1330  BP: 120/63 105/62  Pulse: 82 78  Resp: 16 15  Temp:    SpO2: 97% 92%    Last Pain:  Vitals:   11/23/23 1253  TempSrc:   PainSc: 0-No pain                 Leviathan Macera A.

## 2023-11-23 NOTE — Op Note (Signed)
 11/23/2023  12:42 PM  PATIENT:  [name]    PRE-OPERATIVE DIAGNOSIS:  Osteomyelitis Right 5th Metatarsal  POST-OPERATIVE DIAGNOSIS:  Same  PROCEDURE:  RIGHT 5TH RAY AMPUTATION Local tissue rearrangement for wound closure 10 x 4 cm. Application of 13 cm Prevena wound VAC.  SURGEON:  Jerona LULLA Sage, MD  PHYSICIAN ASSISTANT:None ANESTHESIA:   General  PREOPERATIVE INDICATIONS:  Evan Hamilton is a  58 y.o. male with a diagnosis of Osteomyelitis Right 5th Metatarsal who failed conservative measures and elected for surgical management.    The risks benefits and alternatives were discussed with the patient preoperatively including but not limited to the risks of infection, bleeding, nerve injury, cardiopulmonary complications, the need for revision surgery, among others, and the patient was willing to proceed.  OPERATIVE IMPLANTS:   * No implants in log *  @ENCIMAGES @  OPERATIVE FINDINGS: Tissues and margins were clear after resection of the fifth metatarsal.  OPERATIVE PROCEDURE: Patient was brought the operating room and underwent a general anesthetic.  After adequate levels anesthesia were obtained patient's right lower extremity was prepped using DuraPrep draped into a sterile field a timeout was called.  An elliptical incision was made around the ulcerative tissue and the fifth ray was resected with the necrotic tissue in 1 block of tissue.  This left a wound that was 10 x 4 cm.  Electrocautery was used for hemostasis.  The wound was irrigated with Vashe.  The tissue margins were undermined and local tissue rearrangement was used to close the wound 10 x 4 cm.  This was then covered with a 13 cm Prevena wound VAC.  This had a good suction fit patient was extubated taken the PACU in stable condition.   DISCHARGE PLANNING:  Antibiotic duration: Preoperative antibiotics  Weightbearing: Touchdown weightbearing on the right  Pain medication: Prescription for Vicodin  Dressing care/  Wound VAC: Prevena wound VAC  Ambulatory devices: Crutches  Discharge to: Home.  Follow-up: In the office 1 week post operative.

## 2023-11-23 NOTE — Transfer of Care (Signed)
 Immediate Anesthesia Transfer of Care Note  Patient: Evan Hamilton  Procedure(s) Performed: RIGHT 5TH RAY AMPUTATION (Right: Foot)  Patient Location: PACU  Anesthesia Type:General  Level of Consciousness: awake, alert , oriented, and patient cooperative  Airway & Oxygen Therapy: Patient Spontanous Breathing  Post-op Assessment: Report given to RN and Post -op Vital signs reviewed and stable  Post vital signs: Reviewed and stable  Last Vitals:  Vitals Value Taken Time  BP 128/74 11/23/23 1251  Temp    Pulse 89 11/23/23 1255  Resp 13 11/23/23 1255  SpO2 98 % 11/23/23 1255  Vitals shown include unfiled device data.  Last Pain:  Vitals:   11/23/23 0925  TempSrc: Oral         Complications: No notable events documented.

## 2023-11-23 NOTE — Interval H&P Note (Signed)
 History and Physical Interval Note:  11/23/2023 11:46 AM  Evan Hamilton  has presented today for surgery, with the diagnosis of Osteomyelitis Right 5th Metatarsal.  The various methods of treatment have been discussed with the patient and family. After consideration of risks, benefits and other options for treatment, the patient has consented to  Procedure(s): RIGHT 5TH RAY AMPUTATION (Right) as a surgical intervention.  The patient's history has been reviewed, patient examined, no change in status, stable for surgery.  I have reviewed the patient's chart and labs.  Questions were answered to the patient's satisfaction.     Levan Aloia V Heitor Steinhoff

## 2023-11-23 NOTE — Anesthesia Preprocedure Evaluation (Signed)
 Anesthesia Evaluation  Patient identified by MRN, date of birth, ID band Patient awake    Reviewed: Allergy & Precautions, NPO status , Patient's Chart, lab work & pertinent test results  Airway Mallampati: II  TM Distance: >3 FB     Dental no notable dental hx. (+) Dental Advisory Given, Teeth Intact   Pulmonary sleep apnea , Current Smoker and Patient abstained from smoking.   Pulmonary exam normal breath sounds clear to auscultation       Cardiovascular negative cardio ROS Normal cardiovascular exam Rhythm:Regular Rate:Normal     Neuro/Psych  PSYCHIATRIC DISORDERS Anxiety Depression    negative neurological ROS     GI/Hepatic negative GI ROS, Neg liver ROS,,,  Endo/Other  diabetes, Well Controlled, Type 2    Renal/GU Renal InsufficiencyRenal disease  negative genitourinary   Musculoskeletal Osteomyelitis right 5th metatarsal   Abdominal  (+) + obese  Peds  Hematology negative hematology ROS (+)   Anesthesia Other Findings   Reproductive/Obstetrics                              Anesthesia Physical Anesthesia Plan  ASA: 3  Anesthesia Plan: General   Post-op Pain Management: Dilaudid  IV   Induction: Intravenous  PONV Risk Score and Plan: 2 and Treatment may vary due to age or medical condition, Ondansetron , TIVA, Propofol  infusion and Midazolam   Airway Management Planned: LMA  Additional Equipment: None  Intra-op Plan:   Post-operative Plan: Extubation in OR  Informed Consent: I have reviewed the patients History and Physical, chart, labs and discussed the procedure including the risks, benefits and alternatives for the proposed anesthesia with the patient or authorized representative who has indicated his/her understanding and acceptance.     Dental advisory given  Plan Discussed with: CRNA and Anesthesiologist  Anesthesia Plan Comments:          Anesthesia Quick  Evaluation

## 2023-11-23 NOTE — Progress Notes (Signed)
 Wasted Dilaudid 0.5mg  with Pixie Casino, RN

## 2023-11-23 NOTE — H&P (Signed)
 Evan Hamilton is an 58 y.o. male.   Chief Complaint: Ulceration and cellulitis right little toe. HPI: Patient presents with ulceration base of the right fifth metatarsal.  Ulcer extending down to bone.  Past Medical History:  Diagnosis Date   Allergy    Anxiety and depression    Diabetes mellitus without complication (HCC)    patient has lost weight, no meds, patient states not a diabetic on 11/22/23   History of kidney stones    passed stones   Sleep apnea    does not wear CPAP - lost weight    Past Surgical History:  Procedure Laterality Date   AMPUTATION Left 05/22/2021   Procedure: GREAT TOE AMPUTATION;  Surgeon: Vernetta Lonni GRADE, MD;  Location: [facility] OR;  Service: Orthopedics;  Laterality: Left;   AMPUTATION Right 05/03/2022   Procedure: RIGHT THIRD TOE AMPUTATION;  Surgeon: Harden Jerona GAILS, MD;  Location[facility] OR;  Service: Orthopedics;  Laterality: Right;   CHOLECYSTECTOMY N/A 07/22/2014   Procedure: LAPAROSCOPIC CHOLECYSTECTOMY WITH INTRAOPERATIVE CHOLANGIOGRAM;  Surgeon: Debby Shipper, MD;  Location: [facility] OR;  Service: General;  Laterality: N/A;    Family History  Problem Relation Age of Onset   Cancer Mother        Lung   Diabetes Mother    Heart disease Father    Hyperlipidemia Father    Hypertension Father    Mental illness Sister    Cancer Maternal Grandmother        Lung   Diabetes Maternal Grandmother    Social History:  reports that he has been smoking cigars and cigarettes. He has a 10 pack-year smoking history. He has never used smokeless tobacco. He reports current alcohol use of about 3.0 standard drinks of alcohol per week. He reports that he does not use drugs.  Allergies:  Allergies  Allergen Reactions   Penicillins Hives and Rash    Tolerates Cephalosporins    No medications prior to admission.    No results found for this or any previous visit (from the past 48 hours). No results found.  Review of Systems  All other systems reviewed and are  negative.   Height 6' 2 (1.88 m), weight (!) 138.3 kg. Physical Exam  Examination patient is alert oriented no adenopathy well-dressed normal affect normal respiratory effort.  Patient has a palpable dorsalis pedis pulse.  He has a necrotic ulcer over the base of the fifth metatarsal right foot that probes down to bone.  Radiographs show early destructive changes. Assessment/Plan Assessment: Osteomyelitis and ulceration base of the fifth metatarsal right foot.  Plan: Will plan for amputation of the fifth ray right foot.  Risk and benefits were discussed including risk of the wound not healing need for additional surgery.  Patient states he understands wished to proceed at this time.  Jerona GAILS Harden, MD 11/23/2023, 6:42 AM

## 2023-11-23 NOTE — Anesthesia Procedure Notes (Signed)
 Procedure Name: LMA Insertion Date/Time: 11/23/2023 12:18 PM  Performed by: Genny Gun, CRNAPre-anesthesia Checklist: Patient identified, Emergency Drugs available, Suction available, Timeout performed and Patient being monitored Patient Re-evaluated:Patient Re-evaluated prior to induction Oxygen Delivery Method: Circle system utilized Preoxygenation: Pre-oxygenation with 100% oxygen Induction Type: IV induction LMA: LMA inserted LMA Size: 5.0 Number of attempts: 1 Placement Confirmation: positive ETCO2 and breath sounds checked- equal and bilateral Tube secured with: Tape

## 2023-11-23 NOTE — Progress Notes (Signed)
 Orthopedic Tech Progress Note Patient Details:  Evan Hamilton April 27, 1966 [id number]  Ortho Devices Type of Ortho Device: Crutches, Postop shoe/boot Ortho Device/Splint Location: RLE Ortho Device/Splint Interventions: Adjustment, Application, Ordered   Post Interventions Patient Tolerated: Well Instructions Provided: Poper ambulation with device, Adjustment of device  Evan Hamilton 11/23/2023, 2:18 PM

## 2023-11-24 ENCOUNTER — Encounter (HOSPITAL_COMMUNITY): Payer: Self-pay | Admitting: Orthopedic Surgery

## 2023-11-26 ENCOUNTER — Encounter: Payer: Self-pay | Admitting: Orthopedic Surgery

## 2023-11-26 NOTE — Progress Notes (Signed)
 Office Visit Note   Patient: Evan Hamilton           Date of Birth: [birth year]           MRN: [id number] Visit Date: 11/20/2023              Requested by: No referring provider defined for this encounter. PCP: Patient, No Pcp Per  Chief Complaint  Patient presents with   Right Foot - Wound Check    05/03/2022 right foot 3rd toe amputation       HPI: Patient is a 58 year old gentleman who presents with ulceration fifth metatarsal head right little toe.  Patient states the ulcer has been there since November.  He states there is odor and drainage.  Patient is status post a right third toe amputation in June 2023.  Assessment & Plan: Visit Diagnoses:  1. 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 Gangrene of toe of right foot (HCC)     Plan: Will plan for a right foot fifth ray amputation.  Risks and benefits were discussed including risk of the wound not healing.  Patient states he understands versus proceed at this time.  Patient will continue his antibiotics.  Follow-Up Instructions: Return in about 2 weeks (around 12/04/2023).   Ortho Exam  Patient is alert, oriented, no adenopathy, well-dressed, normal affect, normal respiratory effort. Examination patient has a strong palpable dorsalis pedis pulse.  The ulcer beneath the fifth metatarsal head probes to bone.  After informed consent a 10 blade knife was used to debride the skin and soft tissue back to healthy viable tissue.  There is exposed bone and tendon.  After debridement the ulcer is 3 cm in diameter and 1 cm deep.  Imaging: No results found. No images are attached to the encounter.  Labs: Lab Results  Component Value Date   HGBA1C 5.3 05/21/2021   HGBA1C 8.0 (H) 11/26/2016   HGBA1C 7.9 (H) 11/07/2016   ESRSEDRATE 30 (H) 05/22/2021   ESRSEDRATE 33 (H) 05/21/2021   ESRSEDRATE 32 (H) 05/26/2016   CRP 2.3 (H) 05/22/2021   CRP 2.7 (H) 05/21/2021   CRP 26.9 (H) 07/31/2016   LABURIC  7.2 07/31/2016   REPTSTATUS 11/22/2023 FINAL 11/16/2023   GRAMSTAIN NO WBC SEEN NO ORGANISMS SEEN  05/03/2022   CULT  11/16/2023    NO GROWTH 5 DAYS Performed at [facility], [address], [place], [place] [address]      Lab Results  Component Value Date   ALBUMIN 3.9 11/16/2023   ALBUMIN 3.5 05/21/2021   ALBUMIN 3.4 (L) 05/21/2021    No results found for: MG No results found for: VD25OH  No results found for: PREALBUMIN    Latest Ref Rng & Units 11/16/2023   10:52 PM 04/30/2022   10:32 PM 05/22/2021    6:03 AM  CBC EXTENDED  WBC 4.0 - 10.5 K/uL 15.3  13.9  8.9   RBC 4.22 - 5.81 MIL/uL 5.06  4.92  4.62   Hemoglobin 13.0 - 17.0 g/dL 84.2  84.9  85.6   HCT 39.0 - 52.0 % 46.4  44.1  41.9   Platelets 150 - 400 K/uL 147  142  123   NEUT# 1.7 - 7.7 K/uL 11.0  9.8    Lymph# 0.7 - 4.0 K/uL 2.7  2.6       There is no height or weight on file to calculate BMI.  Orders:  No orders  of the defined types were placed in this encounter.  No orders of the defined types were placed in this encounter.    Procedures: No procedures performed  Clinical Data: No additional findings.  ROS:  All other systems negative, except as noted in the HPI. Review of Systems  Objective: Vital Signs: There were no vitals taken for this visit.  Specialty Comments:  No specialty comments available.  PMFS History: Patient Active Problem List   Diagnosis Date Noted   History of amputation of lesser toe of right foot (HCC) 06/21/2022   Gangrene of right foot (HCC) 05/01/2022   Status post amputation of great toe, left (HCC) 06/06/2021   Osteomyelitis of great toe of left foot (HCC)    Osteomyelitis of ankle or foot, acute, left (HCC) 05/21/2021   Open fracture of left great toe 05/21/2021   AKI (acute kidney injury) (HCC) 11/25/2016   Pyelonephritis 11/25/2016   Acute biliary pancreatitis 07/18/2014   Past Medical History:  Diagnosis Date   Allergy    Anxiety and  depression    Diabetes mellitus without complication (HCC)    Hx of diabetes - patient has lost weight, no meds, patient states not a diabetic on 11/22/23   History of kidney stones    passed stones   Sleep apnea    does not wear CPAP - lost weight    Family History  Problem Relation Age of Onset   Cancer Mother        Lung   Diabetes Mother    Heart disease Father    Hyperlipidemia Father    Hypertension Father    Mental illness Sister    Cancer Maternal Grandmother        Lung   Diabetes Maternal Grandmother     Past Surgical History:  Procedure Laterality Date   AMPUTATION Left 05/22/2021   Procedure: GREAT TOE AMPUTATION;  Surgeon: Vernetta Lonni GRADE, MD;  Location: [facility] OR;  Service: Orthopedics;  Laterality: Left;   AMPUTATION Right 05/03/2022   Procedure: RIGHT THIRD TOE AMPUTATION;  Surgeon: Harden Jerona GAILS, MD;  Location: [facility] OR;  Service: Orthopedics;  Laterality: Right;   AMPUTATION Right 11/23/2023   Procedure: RIGHT 5TH RAY AMPUTATION;  Surgeon: Harden Jerona GAILS, MD;  Location: [facility] OR;  Service: Orthopedics;  Laterality: Right;   CHOLECYSTECTOMY N/A 07/22/2014   Procedure: LAPAROSCOPIC CHOLECYSTECTOMY WITH INTRAOPERATIVE CHOLANGIOGRAM;  Surgeon: Debby Shipper, MD;  Location: [facility] OR;  Service: General;  Laterality: N/A;   Social History   Occupational History   Not on file  Tobacco Use   Smoking status: Some Days    Current packs/day: 0.50    Average packs/day: 0.5 packs/day for 20.0 years (10.0 ttl pk-yrs)    Types: Cigars, Cigarettes   Smokeless tobacco: Never   Tobacco comments:    Smokes cigars 1 per month  Vaping Use   Vaping status: Never Used  Substance and Sexual Activity   Alcohol use: Yes    Alcohol/week: 3.0 standard drinks of alcohol    Types: 3 Standard drinks or equivalent per week   Drug use: No   Sexual activity: Yes

## 2023-11-29 ENCOUNTER — Ambulatory Visit (INDEPENDENT_AMBULATORY_CARE_PROVIDER_SITE_OTHER): Payer: BC Managed Care – PPO | Admitting: Orthopedic Surgery

## 2023-11-29 DIAGNOSIS — Z89421 Acquired absence of other right toe(s): Secondary | ICD-10-CM

## 2023-12-03 ENCOUNTER — Encounter: Payer: Self-pay | Admitting: Orthopedic Surgery

## 2023-12-03 NOTE — Progress Notes (Signed)
 Office Visit Note   Patient: Evan Hamilton           Date of Birth: 1966-10-31           MRN: [id number] Visit Date: 11/29/2023              Requested by: No referring provider defined for this encounter. PCP: Patient, No Pcp Per  Chief Complaint  Patient presents with   Right Foot - Routine Post Op    11/23/2023 right 5th ray amputation       HPI: Patient is a 58 year old gentleman who is seen in follow-up 1 week status post right foot fifth ray amputation.  The wound VAC is removed.  Patient is full weightbearing through his heel.  He is on clindamycin .  Assessment & Plan: Visit Diagnoses:  1. History of amputation of lesser toe of right foot (HCC)     Plan: Discussed the importance of nonweightbearing elevation and compression.  Follow-Up Instructions: Return in about 2 weeks (around 12/13/2023).   Ortho Exam  Patient is alert, oriented, no adenopathy, well-dressed, normal affect, normal respiratory effort. Examination the wound edges are well-approximated there is no cellulitis odor or drainage.  Imaging: No results found. No images are attached to the encounter.  Labs: Lab Results  Component Value Date   HGBA1C 5.3 05/21/2021   HGBA1C 8.0 (H) 11/26/2016   HGBA1C 7.9 (H) 11/07/2016   ESRSEDRATE 30 (H) 05/22/2021   ESRSEDRATE 33 (H) 05/21/2021   ESRSEDRATE 32 (H) 05/26/2016   CRP 2.3 (H) 05/22/2021   CRP 2.7 (H) 05/21/2021   CRP 26.9 (H) 07/31/2016   LABURIC 7.2 07/31/2016   REPTSTATUS 11/22/2023 FINAL 11/16/2023   GRAMSTAIN NO WBC SEEN NO ORGANISMS SEEN  05/03/2022   CULT  11/16/2023    NO GROWTH 5 DAYS Performed at [facility], [address] [place], [place] [address]      Lab Results  Component Value Date   ALBUMIN 3.9 11/16/2023   ALBUMIN 3.5 05/21/2021   ALBUMIN 3.4 (L) 05/21/2021    No results found for: MG No results found for: VD25OH  No results found for: PREALBUMIN    Latest Ref Rng & Units 11/16/2023   10:52 PM  04/30/2022   10:32 PM 05/22/2021    6:03 AM  CBC EXTENDED  WBC 4.0 - 10.5 K/uL 15.3  13.9  8.9   RBC 4.22 - 5.81 MIL/uL 5.06  4.92  4.62   Hemoglobin 13.0 - 17.0 g/dL 84.2  84.9  85.6   HCT 39.0 - 52.0 % 46.4  44.1  41.9   Platelets 150 - 400 K/uL 147  142  123   NEUT# 1.7 - 7.7 K/uL 11.0  9.8    Lymph# 0.7 - 4.0 K/uL 2.7  2.6       There is no height or weight on file to calculate BMI.  Orders:  No orders of the defined types were placed in this encounter.  No orders of the defined types were placed in this encounter.    Procedures: No procedures performed  Clinical Data: No additional findings.  ROS:  All other systems negative, except as noted in the HPI. Review of Systems  Objective: Vital Signs: There were no vitals taken for this visit.  Specialty Comments:  No specialty comments available.  PMFS History: Patient Active Problem List   Diagnosis Date Noted   History of amputation of lesser toe of right foot (HCC) 06/21/2022   Gangrene of right  foot (HCC) 05/01/2022   Status post amputation of great toe, left (HCC) 06/06/2021   Osteomyelitis of great toe of left foot (HCC)    Osteomyelitis of ankle or foot, acute, left (HCC) 05/21/2021   Open fracture of left great toe 05/21/2021   AKI (acute kidney injury) (HCC) 11/25/2016   Pyelonephritis 11/25/2016   Acute biliary pancreatitis 07/18/2014   Past Medical History:  Diagnosis Date   Allergy    Anxiety and depression    Diabetes mellitus without complication (HCC)    Hx of diabetes - patient has lost weight, no meds, patient states not a diabetic on 11/22/23   History of kidney stones    passed stones   Sleep apnea    does not wear CPAP - lost weight    Family History  Problem Relation Age of Onset   Cancer Mother        Lung   Diabetes Mother    Heart disease Father    Hyperlipidemia Father    Hypertension Father    Mental illness Sister    Cancer Maternal Grandmother        Lung   Diabetes  Maternal Grandmother     Past Surgical History:  Procedure Laterality Date   AMPUTATION Left 05/22/2021   Procedure: GREAT TOE AMPUTATION;  Surgeon: Vernetta Lonni GRADE, MD;  Location: [facility] OR;  Service: Orthopedics;  Laterality: Left;   AMPUTATION Right 05/03/2022   Procedure: RIGHT THIRD TOE AMPUTATION;  Surgeon: Harden Jerona GAILS, MD;  Location: [facility] OR;  Service: Orthopedics;  Laterality: Right;   AMPUTATION Right 11/23/2023   Procedure: RIGHT 5TH RAY AMPUTATION;  Surgeon: Harden Jerona GAILS, MD;  Location: [facility] OR;  Service: Orthopedics;  Laterality: Right;   CHOLECYSTECTOMY N/A 07/22/2014   Procedure: LAPAROSCOPIC CHOLECYSTECTOMY WITH INTRAOPERATIVE CHOLANGIOGRAM;  Surgeon: Debby Shipper, MD;  Location: [facility] OR;  Service: General;  Laterality: N/A;   Social History   Occupational History   Not on file  Tobacco Use   Smoking status: Some Days    Current packs/day: 0.50    Average packs/day: 0.5 packs/day for 20.0 years (10.0 ttl pk-yrs)    Types: Cigars, Cigarettes   Smokeless tobacco: Never   Tobacco comments:    Smokes cigars 1 per month  Vaping Use   Vaping status: Never Used  Substance and Sexual Activity   Alcohol use: Yes    Alcohol/week: 3.0 standard drinks of alcohol    Types: 3 Standard drinks or equivalent per week   Drug use: No   Sexual activity: Yes

## 2023-12-06 ENCOUNTER — Ambulatory Visit: Payer: BC Managed Care – PPO | Admitting: Orthopedic Surgery

## 2023-12-13 ENCOUNTER — Encounter: Payer: BC Managed Care – PPO | Admitting: Orthopedic Surgery

## 2023-12-13 ENCOUNTER — Ambulatory Visit (INDEPENDENT_AMBULATORY_CARE_PROVIDER_SITE_OTHER): Payer: BC Managed Care – PPO | Admitting: Orthopedic Surgery

## 2023-12-13 DIAGNOSIS — Z89421 Acquired absence of other right toe(s): Secondary | ICD-10-CM

## 2023-12-14 ENCOUNTER — Encounter: Payer: Self-pay | Admitting: Orthopedic Surgery

## 2023-12-14 NOTE — Progress Notes (Signed)
Office Visit Note   Patient: Evan Hamilton           Date of Birth: 03-Apr-1966           MRN: [id number] Visit Date: 12/13/2023              Requested by: No referring provider defined for this encounter. PCP: Patient, No Pcp Per  Chief Complaint  Patient presents with   Right Foot - Routine Post Op    11/23/2023 right 5th ray amputation       HPI: Patient is a 58 year old gentleman who is 3 weeks status post right foot fifth ray amputation.  Assessment & Plan: Visit Diagnoses:  1. History of amputation of lesser toe of right foot (HCC)     Plan: Recommended nonweightbearing elevation and compression with Dial soap cleansing.  Recommended a kneeling scooter.  Follow-Up Instructions: Return in about 1 week (around 12/20/2023).   Ortho Exam  Patient is alert, oriented, no adenopathy, well-dressed, normal affect, normal respiratory effort. Examination patient has increased swelling with wound dehiscence the wound is gaped open in the center aspect of approximately 1 cm.  There is no depth no exposed bone or tendon no cellulitis.  Imaging: No results found.   Labs: Lab Results  Component Value Date   HGBA1C 5.3 05/21/2021   HGBA1C 8.0 (H) 11/26/2016   HGBA1C 7.9 (H) 11/07/2016   ESRSEDRATE 30 (H) 05/22/2021   ESRSEDRATE 33 (H) 05/21/2021   ESRSEDRATE 32 (H) 05/26/2016   CRP 2.3 (H) 05/22/2021   CRP 2.7 (H) 05/21/2021   CRP 26.9 (H) 07/31/2016   LABURIC 7.2 07/31/2016   REPTSTATUS 11/22/2023 FINAL 11/16/2023   GRAMSTAIN NO WBC SEEN NO ORGANISMS SEEN  05/03/2022   CULT  11/16/2023    NO GROWTH 5 DAYS Performed at [facility], [address] [place], [place] [address]      Lab Results  Component Value Date   ALBUMIN 3.9 11/16/2023   ALBUMIN 3.5 05/21/2021   ALBUMIN 3.4 (L) 05/21/2021    No results found for: "MG" No results found for: "VD25OH"  No results found for: "PREALBUMIN"    Latest Ref Rng & Units 11/16/2023   10:52 PM 04/30/2022    10:32 PM 05/22/2021    6:03 AM  CBC EXTENDED  WBC 4.0 - 10.5 K/uL 15.3  13.9  8.9   RBC 4.22 - 5.81 MIL/uL 5.06  4.92  4.62   Hemoglobin 13.0 - 17.0 g/dL 19.1  47.8  29.5   HCT 39.0 - 52.0 % 46.4  44.1  41.9   Platelets 150 - 400 K/uL 147  142  123   NEUT# 1.7 - 7.7 K/uL 11.0  9.8    Lymph# 0.7 - 4.0 K/uL 2.7  2.6       There is no height or weight on file to calculate BMI.  Orders:  No orders of the defined types were placed in this encounter.  No orders of the defined types were placed in this encounter.    Procedures: No procedures performed  Clinical Data: No additional findings.  ROS:  All other systems negative, except as noted in the HPI. Review of Systems  Objective: Vital Signs: There were no vitals taken for this visit.  Specialty Comments:  No specialty comments available.  PMFS History: Patient Active Problem List   Diagnosis Date Noted   History of amputation of lesser toe of right foot (HCC) 06/21/2022   Gangrene of right foot (HCC)  05/01/2022   Status post amputation of great toe, left (HCC) 06/06/2021   Osteomyelitis of great toe of left foot (HCC)    Osteomyelitis of ankle or foot, acute, left (HCC) 05/21/2021   Open fracture of left great toe 05/21/2021   AKI (acute kidney injury) (HCC) 11/25/2016   Pyelonephritis 11/25/2016   Acute biliary pancreatitis 07/18/2014   Past Medical History:  Diagnosis Date   Allergy    Anxiety and depression    Diabetes mellitus without complication (HCC)    Hx of diabetes - patient has lost weight, no meds, patient states "not a diabetic on 11/22/23"   History of kidney stones    passed stones   Sleep apnea    does not wear CPAP - lost weight    Family History  Problem Relation Age of Onset   Cancer Mother        Lung   Diabetes Mother    Heart disease Father    Hyperlipidemia Father    Hypertension Father    Mental illness Sister    Cancer Maternal Grandmother        Lung   Diabetes Maternal  Grandmother     Past Surgical History:  Procedure Laterality Date   AMPUTATION Left 05/22/2021   Procedure: GREAT TOE AMPUTATION;  Surgeon: Kathryne Hitch, MD;  Location: [facility] OR;  Service: Orthopedics;  Laterality: Left;   AMPUTATION Right 05/03/2022   Procedure: RIGHT THIRD TOE AMPUTATION;  Surgeon: Nadara Mustard, MD;  Location: [facility] OR;  Service: Orthopedics;  Laterality: Right;   AMPUTATION Right 11/23/2023   Procedure: RIGHT 5TH RAY AMPUTATION;  Surgeon: Nadara Mustard, MD;  Location: [facility] OR;  Service: Orthopedics;  Laterality: Right;   CHOLECYSTECTOMY N/A 07/22/2014   Procedure: LAPAROSCOPIC CHOLECYSTECTOMY WITH INTRAOPERATIVE CHOLANGIOGRAM;  Surgeon: Harriette Bouillon, MD;  Location: [facility] OR;  Service: General;  Laterality: N/A;   Social History   Occupational History   Not on file  Tobacco Use   Smoking status: Some Days    Current packs/day: 0.50    Average packs/day: 0.5 packs/day for 20.0 years (10.0 ttl pk-yrs)    Types: Cigars, Cigarettes   Smokeless tobacco: Never   Tobacco comments:    Smokes cigars 1 per month  Vaping Use   Vaping status: Never Used  Substance and Sexual Activity   Alcohol use: Yes    Alcohol/week: 3.0 standard drinks of alcohol    Types: 3 Standard drinks or equivalent per week   Drug use: No   Sexual activity: Yes

## 2023-12-19 ENCOUNTER — Ambulatory Visit (INDEPENDENT_AMBULATORY_CARE_PROVIDER_SITE_OTHER): Payer: BC Managed Care – PPO | Admitting: Family

## 2023-12-19 DIAGNOSIS — Z89421 Acquired absence of other right toe(s): Secondary | ICD-10-CM

## 2023-12-19 DIAGNOSIS — I96 Gangrene, not elsewhere classified: Secondary | ICD-10-CM

## 2023-12-26 ENCOUNTER — Ambulatory Visit (INDEPENDENT_AMBULATORY_CARE_PROVIDER_SITE_OTHER): Payer: BC Managed Care – PPO | Admitting: Family

## 2023-12-26 ENCOUNTER — Encounter: Payer: Self-pay | Admitting: Family

## 2023-12-26 DIAGNOSIS — Z89421 Acquired absence of other right toe(s): Secondary | ICD-10-CM

## 2023-12-26 DIAGNOSIS — I96 Gangrene, not elsewhere classified: Secondary | ICD-10-CM

## 2024-01-01 ENCOUNTER — Encounter: Payer: Self-pay | Admitting: Family

## 2024-01-01 NOTE — Progress Notes (Signed)
 Post-Op Visit Note   Patient: Evan Hamilton           Date of Birth: January 14, 1966           MRN: [id number] Visit Date: 12/26/2023 PCP: Patient, No Pcp Per  Chief Complaint:  Chief Complaint  Patient presents with   Right Foot - Routine Post Op    11/23/2023 right 5th ray amputation       HPI:  HPI The patient is a 58 year old gentleman seen status post fifth ray amputation right foot January 3 Ortho Exam On examination right foot the fifth ray amputation has an area of dehiscence centrally this is 5 cm x 1.5 cm and 5 mm deep. There is no exposed bone or tendon no surrounding erythema or sign of infection Visit Diagnoses: No diagnosis found.  Plan: Again packed with donated Kerecis micro powder.  He will leave this in place for the next 1 week may change gauze overlying the scaffolding dressing.  Minimize weightbearing elevate.  Follow-Up Instructions: No follow-ups on file.   Imaging: No results found.  Orders:  No orders of the defined types were placed in this encounter.  No orders of the defined types were placed in this encounter.    PMFS History: Patient Active Problem List   Diagnosis Date Noted   History of amputation of lesser toe of right foot (HCC) 06/21/2022   Gangrene of right foot (HCC) 05/01/2022   Status post amputation of great toe, left (HCC) 06/06/2021   Osteomyelitis of great toe of left foot (HCC)    Osteomyelitis of ankle or foot, acute, left (HCC) 05/21/2021   Open fracture of left great toe 05/21/2021   AKI (acute kidney injury) (HCC) 11/25/2016   Pyelonephritis 11/25/2016   Acute biliary pancreatitis 07/18/2014   Past Medical History:  Diagnosis Date   Allergy    Anxiety and depression    Diabetes mellitus without complication (HCC)    Hx of diabetes - patient has lost weight, no meds, patient states not a diabetic on 11/22/23   History of kidney stones    passed stones   Sleep apnea    does not wear CPAP - lost weight    Family  History  Problem Relation Age of Onset   Cancer Mother        Lung   Diabetes Mother    Heart disease Father    Hyperlipidemia Father    Hypertension Father    Mental illness Sister    Cancer Maternal Grandmother        Lung   Diabetes Maternal Grandmother     Past Surgical History:  Procedure Laterality Date   AMPUTATION Left 05/22/2021   Procedure: GREAT TOE AMPUTATION;  Surgeon: Vernetta Lonni GRADE, MD;  Location: [facility] OR;  Service: Orthopedics;  Laterality: Left;   AMPUTATION Right 05/03/2022   Procedure: RIGHT THIRD TOE AMPUTATION;  Surgeon: Harden Jerona GAILS, MD;  Location: [facility] OR;  Service: Orthopedics;  Laterality: Right;   AMPUTATION Right 11/23/2023   Procedure: RIGHT 5TH RAY AMPUTATION;  Surgeon: Harden Jerona GAILS, MD;  Location: [facility] OR;  Service: Orthopedics;  Laterality: Right;   CHOLECYSTECTOMY N/A 07/22/2014   Procedure: LAPAROSCOPIC CHOLECYSTECTOMY WITH INTRAOPERATIVE CHOLANGIOGRAM;  Surgeon: Debby Shipper, MD;  Location: [facility] OR;  Service: General;  Laterality: N/A;   Social History   Occupational History   Not on file  Tobacco Use   Smoking status: Some Days    Current packs/day: 0.50    Average packs/day:  0.5 packs/day for 20.0 years (10.0 ttl pk-yrs)    Types: Cigars, Cigarettes   Smokeless tobacco: Never   Tobacco comments:    Smokes cigars 1 per month  Vaping Use   Vaping status: Never Used  Substance and Sexual Activity   Alcohol use: Yes    Alcohol/week: 3.0 standard drinks of alcohol    Types: 3 Standard drinks or equivalent per week   Drug use: No   Sexual activity: Yes

## 2024-01-02 ENCOUNTER — Ambulatory Visit (INDEPENDENT_AMBULATORY_CARE_PROVIDER_SITE_OTHER): Payer: BC Managed Care – PPO | Admitting: Family

## 2024-01-02 ENCOUNTER — Encounter: Payer: Self-pay | Admitting: Family

## 2024-01-02 DIAGNOSIS — T8781 Dehiscence of amputation stump: Secondary | ICD-10-CM

## 2024-01-02 DIAGNOSIS — Z89421 Acquired absence of other right toe(s): Secondary | ICD-10-CM

## 2024-01-02 NOTE — Progress Notes (Signed)
Post-Op Visit Note   Patient: Evan Hamilton           Date of Birth: Nov 13, 1966           MRN: [id number] Visit Date: 01/02/2024 PCP: Patient, No Pcp Per  Chief Complaint:  Chief Complaint  Patient presents with   Right Foot - Routine Post Op     11/23/2023 right 5th ray amputation      HPI:  HPI The patient is a 58 year old gentleman seen status post fifth ray amputation right foot January 3  Has had wound packed with Kerecis micro powder twice since surgery  Ortho Exam On examination right foot the fifth ray amputation has an area of dehiscence centrally this is 4 cm x 1.5 cm and 4 mm deep. There is no exposed bone or tendon no surrounding erythema or sign of infection Visit Diagnoses: No diagnosis found.  Plan: He will cleanse with Vashe and then apply a Vashe to dry dressing packing in the wound.  Minimize weightbearing he will continue with his kneeling scooter. Follow-Up Instructions: Return in about 2 weeks (around 01/16/2024).   Imaging: No results found.  Orders:  No orders of the defined types were placed in this encounter.  No orders of the defined types were placed in this encounter.    PMFS History: Patient Active Problem List   Diagnosis Date Noted   History of amputation of lesser toe of right foot (HCC) 06/21/2022   Gangrene of right foot (HCC) 05/01/2022   Status post amputation of great toe, left (HCC) 06/06/2021   Osteomyelitis of great toe of left foot (HCC)    Osteomyelitis of ankle or foot, acute, left (HCC) 05/21/2021   Open fracture of left great toe 05/21/2021   AKI (acute kidney injury) (HCC) 11/25/2016   Pyelonephritis 11/25/2016   Acute biliary pancreatitis 07/18/2014   Past Medical History:  Diagnosis Date   Allergy    Anxiety and depression    Diabetes mellitus without complication (HCC)    Hx of diabetes - patient has lost weight, no meds, patient states "not a diabetic on 11/22/23"   History of kidney stones    passed stones    Sleep apnea    does not wear CPAP - lost weight    Family History  Problem Relation Age of Onset   Cancer Mother        Lung   Diabetes Mother    Heart disease Father    Hyperlipidemia Father    Hypertension Father    Mental illness Sister    Cancer Maternal Grandmother        Lung   Diabetes Maternal Grandmother     Past Surgical History:  Procedure Laterality Date   AMPUTATION Left 05/22/2021   Procedure: GREAT TOE AMPUTATION;  Surgeon: Kathryne Hitch, MD;  Location: [facility] OR;  Service: Orthopedics;  Laterality: Left;   AMPUTATION Right 05/03/2022   Procedure: RIGHT THIRD TOE AMPUTATION;  Surgeon: Nadara Mustard, MD;  Location: [facility] OR;  Service: Orthopedics;  Laterality: Right;   AMPUTATION Right 11/23/2023   Procedure: RIGHT 5TH RAY AMPUTATION;  Surgeon: Nadara Mustard, MD;  Location: [facility] OR;  Service: Orthopedics;  Laterality: Right;   CHOLECYSTECTOMY N/A 07/22/2014   Procedure: LAPAROSCOPIC CHOLECYSTECTOMY WITH INTRAOPERATIVE CHOLANGIOGRAM;  Surgeon: Harriette Bouillon, MD;  Location: [facility] OR;  Service: General;  Laterality: N/A;   Social History   Occupational History   Not on file  Tobacco Use   Smoking status: Some  Days    Current packs/day: 0.50    Average packs/day: 0.5 packs/day for 20.0 years (10.0 ttl pk-yrs)    Types: Cigars, Cigarettes   Smokeless tobacco: Never   Tobacco comments:    Smokes cigars 1 per month  Vaping Use   Vaping status: Never Used  Substance and Sexual Activity   Alcohol use: Yes    Alcohol/week: 3.0 standard drinks of alcohol    Types: 3 Standard drinks or equivalent per week   Drug use: No   Sexual activity: Yes

## 2024-01-16 ENCOUNTER — Ambulatory Visit (INDEPENDENT_AMBULATORY_CARE_PROVIDER_SITE_OTHER): Payer: BC Managed Care – PPO | Admitting: Family

## 2024-01-16 DIAGNOSIS — T8781 Dehiscence of amputation stump: Secondary | ICD-10-CM

## 2024-01-16 DIAGNOSIS — Z89421 Acquired absence of other right toe(s): Secondary | ICD-10-CM

## 2024-01-22 ENCOUNTER — Encounter: Payer: Self-pay | Admitting: Family

## 2024-01-22 NOTE — Progress Notes (Signed)
 Post-Op Visit Note   Patient: Evan Hamilton           Date of Birth: 29-Dec-1965           MRN: [id number] Visit Date: 01/16/2024 PCP: Patient, No Pcp Per  Chief Complaint:  Chief Complaint  Patient presents with   Right Foot - Routine Post Op    11/23/2023 right 5th ray amputation    HPI:  HPI The patient is a 58 year old gentleman seen status post fifth ray amputation right foot January 3  Has had wound packed with Kerecis micro powder previously. Ortho Exam On examination right foot the fifth ray amputation has an area of dehiscence centrally this is 4 cm x 1.5 cm and 4 mm deep.  It appears to be undermining toward the plantar aspect of his foot about 5 mm There is no exposed bone or tendon no surrounding erythema or sign of infection Visit Diagnoses: No diagnosis found.  Plan: He will cleanse with Vashe and pack open with silver cell.  Minimize weightbearing.    Follow-Up Instructions: No follow-ups on file.   Imaging: No results found.  Orders:  No orders of the defined types were placed in this encounter.  No orders of the defined types were placed in this encounter.    PMFS History: Patient Active Problem List   Diagnosis Date Noted   History of amputation of lesser toe of right foot (HCC) 06/21/2022   Gangrene of right foot (HCC) 05/01/2022   Status post amputation of great toe, left (HCC) 06/06/2021   Osteomyelitis of great toe of left foot (HCC)    Osteomyelitis of ankle or foot, acute, left (HCC) 05/21/2021   Open fracture of left great toe 05/21/2021   AKI (acute kidney injury) (HCC) 11/25/2016   Pyelonephritis 11/25/2016   Acute biliary pancreatitis 07/18/2014   Past Medical History:  Diagnosis Date   Allergy    Anxiety and depression    Diabetes mellitus without complication (HCC)    Hx of diabetes - patient has lost weight, no meds, patient states "not a diabetic on 11/22/23"   History of kidney stones    passed stones   Sleep apnea    does not  wear CPAP - lost weight    Family History  Problem Relation Age of Onset   Cancer Mother        Lung   Diabetes Mother    Heart disease Father    Hyperlipidemia Father    Hypertension Father    Mental illness Sister    Cancer Maternal Grandmother        Lung   Diabetes Maternal Grandmother     Past Surgical History:  Procedure Laterality Date   AMPUTATION Left 05/22/2021   Procedure: GREAT TOE AMPUTATION;  Surgeon: Kathryne Hitch, MD;  Location: [facility] OR;  Service: Orthopedics;  Laterality: Left;   AMPUTATION Right 05/03/2022   Procedure: RIGHT THIRD TOE AMPUTATION;  Surgeon: Nadara Mustard, MD;  Location: [facility] OR;  Service: Orthopedics;  Laterality: Right;   AMPUTATION Right 11/23/2023   Procedure: RIGHT 5TH RAY AMPUTATION;  Surgeon: Nadara Mustard, MD;  Location: [facility] OR;  Service: Orthopedics;  Laterality: Right;   CHOLECYSTECTOMY N/A 07/22/2014   Procedure: LAPAROSCOPIC CHOLECYSTECTOMY WITH INTRAOPERATIVE CHOLANGIOGRAM;  Surgeon: Harriette Bouillon, MD;  Location: [facility] OR;  Service: General;  Laterality: N/A;   Social History   Occupational History   Not on file  Tobacco Use   Smoking status: Some Days  Current packs/day: 0.50    Average packs/day: 0.5 packs/day for 20.0 years (10.0 ttl pk-yrs)    Types: Cigars, Cigarettes   Smokeless tobacco: Never   Tobacco comments:    Smokes cigars 1 per month  Vaping Use   Vaping status: Never Used  Substance and Sexual Activity   Alcohol use: Yes    Alcohol/week: 3.0 standard drinks of alcohol    Types: 3 Standard drinks or equivalent per week   Drug use: No   Sexual activity: Yes

## 2024-01-24 ENCOUNTER — Encounter (HOSPITAL_COMMUNITY): Payer: Self-pay

## 2024-01-24 ENCOUNTER — Emergency Department (HOSPITAL_COMMUNITY)

## 2024-01-24 ENCOUNTER — Other Ambulatory Visit: Payer: Self-pay

## 2024-01-24 ENCOUNTER — Inpatient Hospital Stay (HOSPITAL_COMMUNITY)

## 2024-01-24 ENCOUNTER — Inpatient Hospital Stay (HOSPITAL_COMMUNITY)
Admission: EM | Admit: 2024-01-24 | Discharge: 2024-01-28 | DRG: 872 | Disposition: A | Discharge status: Home / Self Care | Attending: Internal Medicine | Admitting: Internal Medicine

## 2024-01-24 DIAGNOSIS — E669 Obesity, unspecified: Secondary | ICD-10-CM | POA: Diagnosis present

## 2024-01-24 DIAGNOSIS — Z8249 Family history of ischemic heart disease and other diseases of the circulatory system: Secondary | ICD-10-CM | POA: Diagnosis not present

## 2024-01-24 DIAGNOSIS — R6520 Severe sepsis without septic shock: Secondary | ICD-10-CM | POA: Diagnosis not present

## 2024-01-24 DIAGNOSIS — F32A Depression, unspecified: Secondary | ICD-10-CM | POA: Diagnosis present

## 2024-01-24 DIAGNOSIS — M7989 Other specified soft tissue disorders: Secondary | ICD-10-CM | POA: Diagnosis not present

## 2024-01-24 DIAGNOSIS — N3000 Acute cystitis without hematuria: Secondary | ICD-10-CM | POA: Diagnosis not present

## 2024-01-24 DIAGNOSIS — Z83438 Family history of other disorder of lipoprotein metabolism and other lipidemia: Secondary | ICD-10-CM | POA: Diagnosis not present

## 2024-01-24 DIAGNOSIS — R918 Other nonspecific abnormal finding of lung field: Secondary | ICD-10-CM | POA: Diagnosis not present

## 2024-01-24 DIAGNOSIS — K429 Umbilical hernia without obstruction or gangrene: Secondary | ICD-10-CM | POA: Diagnosis not present

## 2024-01-24 DIAGNOSIS — Z833 Family history of diabetes mellitus: Secondary | ICD-10-CM | POA: Diagnosis not present

## 2024-01-24 DIAGNOSIS — N390 Urinary tract infection, site not specified: Secondary | ICD-10-CM | POA: Diagnosis present

## 2024-01-24 DIAGNOSIS — D649 Anemia, unspecified: Secondary | ICD-10-CM | POA: Diagnosis present

## 2024-01-24 DIAGNOSIS — Z89421 Acquired absence of other right toe(s): Secondary | ICD-10-CM

## 2024-01-24 DIAGNOSIS — N179 Acute kidney failure, unspecified: Secondary | ICD-10-CM | POA: Diagnosis present

## 2024-01-24 DIAGNOSIS — F17290 Nicotine dependence, other tobacco product, uncomplicated: Secondary | ICD-10-CM | POA: Diagnosis present

## 2024-01-24 DIAGNOSIS — R000 Tachycardia, unspecified: Secondary | ICD-10-CM | POA: Diagnosis not present

## 2024-01-24 DIAGNOSIS — I96 Gangrene, not elsewhere classified: Secondary | ICD-10-CM | POA: Diagnosis not present

## 2024-01-24 DIAGNOSIS — E11621 Type 2 diabetes mellitus with foot ulcer: Secondary | ICD-10-CM | POA: Diagnosis present

## 2024-01-24 DIAGNOSIS — N400 Enlarged prostate without lower urinary tract symptoms: Secondary | ICD-10-CM | POA: Diagnosis not present

## 2024-01-24 DIAGNOSIS — Z1152 Encounter for screening for COVID-19: Secondary | ICD-10-CM

## 2024-01-24 DIAGNOSIS — F419 Anxiety disorder, unspecified: Secondary | ICD-10-CM | POA: Diagnosis present

## 2024-01-24 DIAGNOSIS — N281 Cyst of kidney, acquired: Secondary | ICD-10-CM | POA: Diagnosis not present

## 2024-01-24 DIAGNOSIS — L97519 Non-pressure chronic ulcer of other part of right foot with unspecified severity: Secondary | ICD-10-CM | POA: Diagnosis not present

## 2024-01-24 DIAGNOSIS — A419 Sepsis, unspecified organism: Principal | ICD-10-CM | POA: Diagnosis present

## 2024-01-24 DIAGNOSIS — Z888 Allergy status to other drugs, medicaments and biological substances status: Secondary | ICD-10-CM

## 2024-01-24 DIAGNOSIS — M19071 Primary osteoarthritis, right ankle and foot: Secondary | ICD-10-CM | POA: Diagnosis not present

## 2024-01-24 DIAGNOSIS — D696 Thrombocytopenia, unspecified: Secondary | ICD-10-CM | POA: Diagnosis present

## 2024-01-24 DIAGNOSIS — Z89412 Acquired absence of left great toe: Secondary | ICD-10-CM

## 2024-01-24 DIAGNOSIS — E1169 Type 2 diabetes mellitus with other specified complication: Secondary | ICD-10-CM | POA: Diagnosis present

## 2024-01-24 DIAGNOSIS — M869 Osteomyelitis, unspecified: Secondary | ICD-10-CM | POA: Diagnosis not present

## 2024-01-24 DIAGNOSIS — F17210 Nicotine dependence, cigarettes, uncomplicated: Secondary | ICD-10-CM | POA: Diagnosis not present

## 2024-01-24 DIAGNOSIS — M86171 Other acute osteomyelitis, right ankle and foot: Secondary | ICD-10-CM | POA: Diagnosis not present

## 2024-01-24 DIAGNOSIS — Z6841 Body Mass Index (BMI) 40.0 and over, adult: Secondary | ICD-10-CM | POA: Diagnosis not present

## 2024-01-24 DIAGNOSIS — M86271 Subacute osteomyelitis, right ankle and foot: Secondary | ICD-10-CM | POA: Diagnosis not present

## 2024-01-24 DIAGNOSIS — Z880 Allergy status to penicillin: Secondary | ICD-10-CM | POA: Diagnosis not present

## 2024-01-24 DIAGNOSIS — G4730 Sleep apnea, unspecified: Secondary | ICD-10-CM | POA: Diagnosis present

## 2024-01-24 DIAGNOSIS — E1152 Type 2 diabetes mellitus with diabetic peripheral angiopathy with gangrene: Secondary | ICD-10-CM | POA: Diagnosis not present

## 2024-01-24 DIAGNOSIS — M86172 Other acute osteomyelitis, left ankle and foot: Secondary | ICD-10-CM | POA: Diagnosis not present

## 2024-01-24 LAB — BASIC METABOLIC PANEL
Anion gap: 11 (ref 5–15)
BUN: 36 mg/dL — ABNORMAL HIGH (ref 6–20)
CO2: 21 mmol/L — ABNORMAL LOW (ref 22–32)
Calcium: 8.5 mg/dL — ABNORMAL LOW (ref 8.9–10.3)
Chloride: 100 mmol/L (ref 98–111)
Creatinine, Ser: 1.92 mg/dL — ABNORMAL HIGH (ref 0.61–1.24)
GFR, Estimated: 40 mL/min — ABNORMAL LOW (ref >60)
Glucose, Bld: 151 mg/dL — ABNORMAL HIGH (ref 70–99)
Potassium: 3.3 mmol/L — ABNORMAL LOW (ref 3.5–5.1)
Sodium: 132 mmol/L — ABNORMAL LOW (ref 135–145)

## 2024-01-24 LAB — CBC WITH DIFFERENTIAL/PLATELET
Abs Immature Granulocytes: 0.38 10*3/uL — ABNORMAL HIGH (ref 0.00–0.07)
Basophils Absolute: 0.1 10*3/uL (ref 0.0–0.1)
Basophils Relative: 0 %
Eosinophils Absolute: 0 10*3/uL (ref 0.0–0.5)
Eosinophils Relative: 0 %
HCT: 39.5 % (ref 39.0–52.0)
Hemoglobin: 13.5 g/dL (ref 13.0–17.0)
Immature Granulocytes: 2 %
Lymphocytes Relative: 3 %
Lymphs Abs: 0.6 10*3/uL — ABNORMAL LOW (ref 0.7–4.0)
MCH: 30.9 pg (ref 26.0–34.0)
MCHC: 34.2 g/dL (ref 30.0–36.0)
MCV: 90.4 fL (ref 80.0–100.0)
Monocytes Absolute: 1.1 10*3/uL — ABNORMAL HIGH (ref 0.1–1.0)
Monocytes Relative: 5 %
Neutro Abs: 18.8 10*3/uL — ABNORMAL HIGH (ref 1.7–7.7)
Neutrophils Relative %: 90 %
Platelets: 121 10*3/uL — ABNORMAL LOW (ref 150–400)
RBC: 4.37 MIL/uL (ref 4.22–5.81)
RDW: 13.2 % (ref 11.5–15.5)
WBC: 20.9 10*3/uL — ABNORMAL HIGH (ref 4.0–10.5)
nRBC: 0 % (ref 0.0–0.2)

## 2024-01-24 LAB — RESP PANEL BY RT-PCR (RSV, FLU A&B, COVID)  RVPGX2
Influenza A by PCR: NEGATIVE
Influenza B by PCR: NEGATIVE
Resp Syncytial Virus by PCR: NEGATIVE
SARS Coronavirus 2 by RT PCR: NEGATIVE

## 2024-01-24 LAB — TROPONIN I (HIGH SENSITIVITY): Troponin I (High Sensitivity): 107 ng/L (ref <18)

## 2024-01-24 LAB — URINALYSIS, W/ REFLEX TO CULTURE (INFECTION SUSPECTED)
Bilirubin Urine: NEGATIVE
Glucose, UA: NEGATIVE mg/dL
Ketones, ur: NEGATIVE mg/dL
Nitrite: NEGATIVE
Protein, ur: 100 mg/dL — AB
RBC / HPF: >50 RBC/hpf (ref 0–5)
Specific Gravity, Urine: 1.011 (ref 1.005–1.030)
WBC, UA: >50 WBC/hpf (ref 0–5)
pH: 5 (ref 5.0–8.0)

## 2024-01-24 LAB — I-STAT CG4 LACTIC ACID, ED
Lactic Acid, Venous: 1.2 mmol/L (ref 0.5–1.9)
Lactic Acid, Venous: 0.8 mmol/L (ref 0.5–1.9)

## 2024-01-24 LAB — PROTIME-INR
INR: 1.2 (ref 0.8–1.2)
Prothrombin Time: 15.5 s — ABNORMAL HIGH (ref 11.4–15.2)

## 2024-01-24 MED ORDER — SODIUM CHLORIDE 0.9% FLUSH
3.0000 mL | Freq: Two times a day (BID) | INTRAVENOUS | Status: DC
  Administered 2024-01-24 – 2024-01-28 (×6): 3 mL via INTRAVENOUS

## 2024-01-24 MED ORDER — ACETAMINOPHEN 325 MG PO TABS
650.0000 mg | ORAL_TABLET | Freq: Four times a day (QID) | ORAL | Status: DC | PRN
  Administered 2024-01-26 – 2024-01-27 (×3): 650 mg via ORAL
  Filled 2024-01-24 (×3): qty 2

## 2024-01-24 MED ORDER — ONDANSETRON 4 MG PO TBDP
4.0000 mg | ORAL_TABLET | Freq: Once | ORAL | Status: AC
  Administered 2024-01-24: 4 mg via ORAL
  Filled 2024-01-24: qty 1

## 2024-01-24 MED ORDER — SODIUM CHLORIDE 0.9 % IV BOLUS
500.0000 mL | Freq: Once | INTRAVENOUS | Status: AC
  Administered 2024-01-24: 500 mL via INTRAVENOUS

## 2024-01-24 MED ORDER — VANCOMYCIN HCL IN DEXTROSE 1-5 GM/200ML-% IV SOLN: 1000.0000 mg | Freq: Once | INTRAVENOUS | Status: DC

## 2024-01-24 MED ORDER — SODIUM CHLORIDE 0.9 % IV SOLN
2.0000 g | INTRAVENOUS | Status: DC
  Administered 2024-01-24 – 2024-01-28 (×5): 2 g via INTRAVENOUS
  Filled 2024-01-24 (×5): qty 20

## 2024-01-24 MED ORDER — LACTATED RINGERS IV SOLN: INTRAVENOUS | Status: AC

## 2024-01-24 MED ORDER — SODIUM CHLORIDE 0.9 % IV BOLUS
1000.0000 mL | Freq: Once | INTRAVENOUS | Status: AC
  Administered 2024-01-24: 1000 mL via INTRAVENOUS

## 2024-01-24 MED ORDER — VANCOMYCIN HCL 2000 MG/400ML IV SOLN
2000.0000 mg | Freq: Once | INTRAVENOUS | Status: AC
  Administered 2024-01-24: 2000 mg via INTRAVENOUS
  Filled 2024-01-24: qty 400

## 2024-01-24 MED ORDER — ACETAMINOPHEN 650 MG RE SUPP: 650.0000 mg | Freq: Four times a day (QID) | RECTAL | Status: DC | PRN

## 2024-01-24 MED ORDER — POLYETHYLENE GLYCOL 3350 17 G PO PACK: 17.0000 g | PACK | Freq: Every day | ORAL | Status: DC | PRN

## 2024-01-24 MED ORDER — POTASSIUM CHLORIDE CRYS ER 20 MEQ PO TBCR
40.0000 meq | EXTENDED_RELEASE_TABLET | Freq: Once | ORAL | Status: AC
  Administered 2024-01-24: 40 meq via ORAL
  Filled 2024-01-24: qty 2

## 2024-01-24 MED ORDER — GADOBUTROL 1 MMOL/ML IV SOLN
10.0000 mL | Freq: Once | INTRAVENOUS | Status: AC | PRN
  Administered 2024-01-24: 10 mL via INTRAVENOUS

## 2024-01-24 MED ORDER — ACETAMINOPHEN 500 MG PO TABS
1000.0000 mg | ORAL_TABLET | Freq: Once | ORAL | Status: AC
  Administered 2024-01-24: 1000 mg via ORAL
  Filled 2024-01-24: qty 2

## 2024-01-24 MED ORDER — ENOXAPARIN SODIUM 80 MG/0.8ML IJ SOSY
80.0000 mg | PREFILLED_SYRINGE | INTRAMUSCULAR | Status: DC
  Administered 2024-01-25 – 2024-01-28 (×4): 80 mg via SUBCUTANEOUS
  Filled 2024-01-24 (×5): qty 0.8

## 2024-01-24 NOTE — ED Notes (Signed)
 Non-stick gauze applied to R foot, secured with tape.

## 2024-01-24 NOTE — ED Notes (Signed)
 [facility] called for transport.

## 2024-01-24 NOTE — ED Triage Notes (Signed)
 Pt reports flu like symptoms for a few days. Febrile in triage.

## 2024-01-24 NOTE — Progress Notes (Signed)
 A consult was received from an ED physician for vancomycin per pharmacy dosing (for an indication other than meningitis). The patient's profile has been reviewed for ht/wt/allergies/indication/available labs. A one time order has been placed for the above antibiotics.  Further antibiotics/pharmacy consults should be ordered by admitting physician if indicated.                       Bernadene Person, PharmD, BCPS [phone] 01/24/2024, 1:30 PM

## 2024-01-24 NOTE — ED Provider Notes (Signed)
 [place] EMERGENCY DEPARTMENT AT [facility] Provider Note   CSN: [id number] Arrival date & time: 01/24/24  1004     History  Chief Complaint  Patient presents with   Fever   Cough   Generalized Body Aches    Evan Hamilton is a 58 y.o. male.   Fever Associated symptoms: congestion, cough, nausea and vomiting   Cough Associated symptoms: fever   Patient is a 58 year old male presents ED today with a 2-day history of cough, congestion, fever, "blood in the urine".  Reporting that starting today he has had 1 episode of nausea and vomiting and 1 episode of diarrhea.  Previous medical history of acute biliary pancreatitis, AKI, pyelonephritis, osteomyelitis of great toe of left foot, gangrene of right foot currently undergoing wound care last seen last week.  Denies any sick contacts at home or work.  States he has been tolerating p.o. intake well until today where he tried to drink a protein smoothie and was unable to tolerate it with vomiting afterward.  However reports having recently started the ketogenic diet 3 days ago.  States he has been faithful to keep the area clean and use silver cell with Vashe .  Reports intermittent right lower extremity edema which is chronic if wrapped to tightly for the wound on right foot. Denies headaches, visual changes, neck pain, shortness of breath, chest pain, abdominal pain.     Home Medications Prior to Admission medications   Medication Sig Start Date End Date Taking? Authorizing Provider  acetaminophen (TYLENOL) 500 MG tablet Take 1,500 mg by mouth every 8 (eight) hours as needed for moderate pain.    [provider]  APPLE CIDER VINEGAR PO Take 2 tablets by mouth daily.    [provider]  Ginkgo Biloba 120 MG CAPS Take 120 mg by mouth daily.    [provider]  HYDROcodone-acetaminophen (NORCO/VICODIN) 5-325 MG tablet Take 1 tablet by mouth every 4 (four) hours as needed. 11/23/23   Nadara Mustard,  MD  ibuprofen (ADVIL) 200 MG tablet Take 600 mg by mouth every 6 (six) hours as needed for moderate pain.    [provider]  loratadine (CLARITIN) 10 MG tablet Take 10 mg by mouth every morning.    [provider]  Multiple Vitamin (MULTIVITAMIN WITH MINERALS) TABS tablet Take 1 tablet by mouth every morning.    [provider]      Allergies    Penicillins    Review of Systems   Review of Systems  Constitutional:  Positive for fever.  HENT:  Positive for congestion.   Respiratory:  Positive for cough.   Gastrointestinal:  Positive for nausea and vomiting.  Genitourinary:        Hematuria  All other systems reviewed and are negative.   Physical Exam Updated Vital Signs BP 110/62   Pulse 86   Temp 98.7 F (37.1 C) (Oral)   Resp 17   Ht 6\' 2"  (1.88 m)   Wt (!) 154 kg   SpO2 99%   BMI 43.59 kg/m  Physical Exam Vitals and nursing note reviewed.  Constitutional:      General: He is not in acute distress.    Appearance: Normal appearance. He is ill-appearing and diaphoretic.  HENT:     Head: Normocephalic and atraumatic.     Mouth/Throat:     Mouth: Mucous membranes are moist.     Pharynx: Oropharynx is clear. No oropharyngeal exudate.  Eyes:  General: No scleral icterus.       Right eye: No discharge.        Left eye: No discharge.     Extraocular Movements: Extraocular movements intact.     Conjunctiva/sclera: Conjunctivae normal.  Cardiovascular:     Rate and Rhythm: Regular rhythm. Tachycardia present.     Pulses: Normal pulses.     Heart sounds: Normal heart sounds. No murmur heard.    No friction rub. No gallop.  Pulmonary:     Effort: Pulmonary effort is normal. No respiratory distress.     Breath sounds: Normal breath sounds. No stridor. No wheezing or rhonchi.  Abdominal:     General: Abdomen is flat. There is no distension.     Palpations: Abdomen is soft.     Tenderness: There is no abdominal tenderness. There is no right  CVA tenderness, left CVA tenderness or guarding.  Musculoskeletal:        General: No tenderness or deformity. Normal range of motion.     Cervical back: Normal range of motion and neck supple. No rigidity or tenderness.     Right lower leg: Edema (Mild pitting edema 2+ on right side) present.     Left lower leg: No edema.  Skin:    General: Skin is warm.     Findings: Lesion (Noted to have a well-healing ulcer on lateral aspect of right foot) present. No bruising or erythema.  Neurological:     General: No focal deficit present.     Mental Status: He is alert and oriented to person, place, and time. Mental status is at baseline.     Motor: No weakness.     Gait: Gait normal.  Psychiatric:        Mood and Affect: Mood normal.     ED Results / Procedures / Treatments   Labs (all labs ordered are listed, but only abnormal results are displayed) Labs Reviewed  CBC WITH DIFFERENTIAL/PLATELET - Abnormal; Notable for the following components:      Result Value   WBC 20.9 (*)    Platelets 121 (*)    Neutro Abs 18.8 (*)    Lymphs Abs 0.6 (*)    Monocytes Absolute 1.1 (*)    Abs Immature Granulocytes 0.38 (*)    All other components within normal limits  BASIC METABOLIC PANEL - Abnormal; Notable for the following components:   Sodium 132 (*)    Potassium 3.3 (*)    CO2 21 (*)    Glucose, Bld 151 (*)    BUN 36 (*)    Creatinine, Ser 1.92 (*)    Calcium 8.5 (*)    GFR, Estimated 40 (*)    All other components within normal limits  PROTIME-INR - Abnormal; Notable for the following components:   Prothrombin Time 15.5 (*)    All other components within normal limits  RESP PANEL BY RT-PCR (RSV, FLU A&B, COVID)  RVPGX2  CULTURE, BLOOD (ROUTINE X 2)  CULTURE, BLOOD (ROUTINE X 2)  URINALYSIS, W/ REFLEX TO CULTURE (INFECTION SUSPECTED)  I-STAT CG4 LACTIC ACID, ED  I-STAT CG4 LACTIC ACID, ED    EKG None  Radiology No results found.  Procedures .Critical Care  Performed by:  Lunette Stands, PA-C Authorized by: Lunette Stands, PA-C   Critical care provider statement:    Critical care time (minutes):  58   Critical care time was exclusive of:  Separately billable procedures and treating other patients   Critical care was  necessary to treat or prevent imminent or life-threatening deterioration of the following conditions:  Sepsis   Critical care was time spent personally by me on the following activities:  Development of treatment plan with patient or surrogate, discussions with consultants, evaluation of patient's response to treatment, examination of patient, ordering and review of laboratory studies, ordering and review of radiographic studies, ordering and performing treatments and interventions, pulse oximetry, re-evaluation of patient's condition, review of old charts, blood draw for specimens and obtaining history from patient or surrogate    Medications Ordered in ED Medications  cefTRIAXone (ROCEPHIN) 2 g in sodium chloride 0.9 % 100 mL IVPB (0 g Intravenous Stopped 01/24/24 1338)  ondansetron (ZOFRAN-ODT) disintegrating tablet 4 mg (4 mg Oral Given 01/24/24 1052)  acetaminophen (TYLENOL) tablet 1,000 mg (1,000 mg Oral Given 01/24/24 1052)  sodium chloride 0.9 % bolus 500 mL (0 mLs Intravenous Stopped 01/24/24 1253)  vancomycin (VANCOREADY) IVPB 2000 mg/400 mL (2,000 mg Intravenous New Bag/Given 01/24/24 1326)  sodium chloride 0.9 % bolus 1,000 mL (1,000 mLs Intravenous New Bag/Given 01/24/24 1333)    ED Course/ Medical Decision Making/ A&P  Medical Decision Making  Patient is a 58 year old male presents ED today with a 2-day history of cough, congestion, fever, "blood in the urine".  States that he has been urinating normally however has noted may be some decreased flow.  Reporting that starting today he has had 1 episode of nausea and vomiting and 1 episode of diarrhea.  Previous medical history of acute biliary pancreatitis, AKI, pyelonephritis, osteomyelitis of  great toe of left foot, gangrene of right foot currently undergoing wound care last seen last week.  Denies any sick contacts at home or work.  However reports having recently started the ketogenic diet 3 days ago.  On physical exam, patient is noted to be febrile, mildly diaphoretic, no acute distress, alert and oriented x 4.  No meningeal signs present at this time.  Noted to be tachycardic but not tachypneic.  LCTAB.  Oropharynx was clear with no signs of infection.  No abdominal tenderness.  Wound on right foot appears to be well-healing with no signs of worsening infection, patient's spouse is present who also confirms that it is better appearing. Provided Tylenol and Zofran for nausea.  Initial labs were done which showed a decreased kidney function with a creatinine of 0.88 and GFR above 60 two months ago to creatinine 1.92 and GFR 40 today.  As well as an elevated white count from normal which was 13 previously but now 20.    On reevaluation, patient was noted to have decreased heart rate/persistent fever and hypotension.  Still complaining of no pain. For this with a possible source of infection being his gangrenous right foot, patient was made a code sepsis with cultures drawn and Rocephin with vancomycin per pharmacy put in due to patient having a penicillin allergy resulting in rashes and hives.  Treating patient for sepsis of unknown source.  Patient has previously had Rocephin without difficulty.  On reevaluation, patient is noted to say that his symptoms have drastically improved however says that he may be able to provide urine for [place] without the need for catheterization.  At time of handoff, x-rays are pending, UA needs to be obtained via clean-catch or catheterization if patient is unable to urinate, continue sepsis workup and will lead to possible admission for possible sepsis and AKI.  Patient care was then handed off to the next on shift provider.    Differential  diagnoses prior to  evaluation: The emergent differential diagnosis includes, but is not limited to, sepsis secondary to UTI, sepsis secondary to gangrenous right foot, URI, pneumonia,. This is not an exhaustive differential.   Past Medical History / Co-morbidities / Social History: AKI, pyelonephritis, osteomyelitis of left foot, gangrene of right foot, diabetes.  Additional history: Chart reviewed. Pertinent results include:  Discharged on 11/23/2023 for gangrene of right foot.  Where he underwent amputation fifth ray of the right foot.  Has followed with orthopedic surgery with last visit being on 01/16/2024 where he was told to continue to cleanse with Vashe and packed open with silver cell with minimal weightbearing.  Lab Tests/Imaging studies: I personally interpreted labs/imaging and the pertinent results include:   CBC was notable for an elevated white count of 20.9 which was increased from 15.32 months ago BMP was notable for mild hyponatremia of 132, mild hypokalemia of 3.3, mildly decreased bicarb of 21, increased BUN of 36 which was decreased from previous 2 months ago of 39, increased creatinine of 1.92 from 2 months ago which was 0.88, mild hypocalcemia of 8.5, decreased GFR of 40 which was decreased from 2 months ago at greater than 60. I-STAT lactic acid was 1.2 PT/INR had a PT time 15.5 but was otherwise unremarkable Respiratory panel is negative Chest x-ray pending Foot x-ray pending UA pending Blood cultures pending  Cardiac monitoring: EKG obtained and interpreted by myself and attending physician which shows: Sinus rhythm   Medications: I ordered medication including 2 g ceftriaxone, 2 g of vancomycin, Zofran ODT, Tylenol.  I have reviewed the patients home medicines and have made adjustments as needed.  Critical Interventions: Sepsis care with Rocephin, vancomycin, 2 L of normal saline.  Disposition: 3:28 PM Care of Javeion Cannedy transferred to [facility]   at the end of my  shift as the patient will require reassessment once labs/imaging have resulted. Patient presentation, ED course, and plan of care discussed with review of all pertinent labs and imaging. Please see his/her note for further details regarding further ED course and disposition.   Plan at time of handoff is await x-rays to differentiate possible cause of infection for sepsis, await labs and imaging, look to pursue admission for sepsis and AKI. This may be altered or completely changed at the discretion of the oncoming team pending results of further workup.  Final Clinical Impression(s) / ED Diagnoses Final diagnoses:  Sepsis, due to unspecified organism, unspecified whether acute organ dysfunction present (HCC)  AKI (acute kidney injury) (HCC)    Rx / DC Orders ED Discharge Orders     None      Portions of this report may have been transcribed using voice recognition software. Every effort was made to ensure accuracy; however, inadvertent computerized transcription errors may be present.    Lunette Stands, PA-C 01/24/24 1530    Coral Spikes, DO 01/24/24 1601

## 2024-01-24 NOTE — Progress Notes (Signed)
 Dr. Joneen Roach notified that patient  troponin high sensitivity lab is 107.

## 2024-01-24 NOTE — ED Notes (Signed)
Pt transport to MRI

## 2024-01-24 NOTE — H&P (Signed)
 History and Physical    Patient: Evan Hamilton UJW:[id number] DOB: 27-Nov-1965 DOA: 01/24/2024 DOS: the patient was seen and examined on 01/24/2024 PCP: Patient, No Pcp Per  Patient coming from: Home  Chief Complaint:  Chief Complaint  Patient presents with   Fever   Cough   Generalized Body Aches   HPI: Evan Hamilton is a 58 y.o. 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 Complicated by wound dehisences . Sees ortho regularly.  Patient was in his usual state of health till day before yesterday when he reports a new onset of difficulty/straining to have to urinate.  Subsequently that afternoon, patient reports new onset of generalized bodyaches/fatigue subjective fevers tremors/rigors.  When patient would have rigors he would have tachypnea.  However there was no reported shortness of breath or chest pain.  There is no flank pain reported.  Patient's urine has been very dry dark-colored.  Today patient had recurrence of severe rigors and on attempting to eat he threw up once and recalls having 1 loose bowel movement.  Patient came to the ER.  Found to have a temperature of 101 F.  Blood pressure 71/48.  Patient is s/p IV fluids as well as vancomycin and Rocephin.  Patient is now feeling "hell of a lot better ".  Case has been discussed with the orthopedic service who have requested that the patient be transferred to [facility] for Ortho evaluation given finding of lucency of cuboid bone.  On x-ray.  MRI is pending.  No cough or expectoration no rash on skin no ear pain no sore throat no focal pain anywhere on the body.     Review of Systems: As mentioned in the history of present illness. All other systems reviewed and are negative. Past Medical History:  Diagnosis Date   Allergy    Anxiety and depression    Diabetes mellitus without complication (HCC)    Hx of diabetes - patient has lost weight, no meds, patient states "not a  diabetic on 11/22/23"   History of kidney stones    passed stones   Sleep apnea    does not wear CPAP - lost weight   Past Surgical History:  Procedure Laterality Date   AMPUTATION Left 05/22/2021   Procedure: GREAT TOE AMPUTATION;  Surgeon: Kathryne Hitch, MD;  Location: [facility] OR;  Service: Orthopedics;  Laterality: Left;   AMPUTATION Right 05/03/2022   Procedure: RIGHT THIRD TOE AMPUTATION;  Surgeon: Nadara Mustard, MD;  Location: [facility] OR;  Service: Orthopedics;  Laterality: Right;   AMPUTATION Right 11/23/2023   Procedure: RIGHT 5TH RAY AMPUTATION;  Surgeon: Nadara Mustard, MD;  Location: [facility];  Service: Orthopedics;  Laterality: Right;   CHOLECYSTECTOMY N/A 07/22/2014   Procedure: LAPAROSCOPIC CHOLECYSTECTOMY WITH INTRAOPERATIVE CHOLANGIOGRAM;  Surgeon: Harriette Bouillon, MD;  Location: [facility] OR;  Service: General;  Laterality: N/A;   Social History:  reports that he has been smoking cigars and cigarettes. He has a 10 pack-year smoking history. He has never used smokeless tobacco. He reports current alcohol use of about 3.0 standard drinks of alcohol per week. He reports that he does not use drugs.  Allergies  Allergen Reactions   Penicillins Hives and Rash    Tolerates Cephalosporins    Family History  Problem Relation Age of Onset   Cancer Mother        Lung   Diabetes Mother    Heart disease Father  Hyperlipidemia Father    Hypertension Father    Mental illness Sister    Cancer Maternal Grandmother        Lung   Diabetes Maternal Grandmother     Prior to Admission medications   Medication Sig Start Date End Date Taking? Authorizing Provider  acetaminophen (TYLENOL) 500 MG tablet Take 1,500 mg by mouth every 8 (eight) hours as needed for moderate pain.    [provider]  APPLE CIDER VINEGAR PO Take 2 tablets by mouth daily.    [provider]  Ginkgo Biloba 120 MG CAPS Take 120 mg by mouth daily.    [provider]  HYDROcodone-acetaminophen  (NORCO/VICODIN) 5-325 MG tablet Take 1 tablet by mouth every 4 (four) hours as needed. 11/23/23   Nadara Mustard, MD  ibuprofen (ADVIL) 200 MG tablet Take 600 mg by mouth every 6 (six) hours as needed for moderate pain.    [provider]  loratadine (CLARITIN) 10 MG tablet Take 10 mg by mouth every morning.    [provider]  Multiple Vitamin (MULTIVITAMIN WITH MINERALS) TABS tablet Take 1 tablet by mouth every morning.    [provider]    Physical Exam: Vitals:   01/24/24 1645 01/24/24 1700 01/24/24 1745 01/24/24 1800  BP: (!) 94/56 92/60 102/62 (!) 122/95  Pulse: 75 75 78 77  Resp:    14  Temp:    97.6 F (36.4 C)  TempSrc:    Oral  SpO2: 97% 100% 92% 98%  Weight:      Height:       General: Obese appearing gentleman in no distress, alert awake oriented x 3 fully coherent Respiratory exam: Bilateral intravesicular Cardiovascular exam S1-S2 normal Abdomen all quadrants are soft nontender no costovertebral angle tenderness no spinal tenderness Extremities warm without edema Right foot lateral margin area dressing in place, wound appears dry and clean without foul smell or purulence.   Media Information  Document Information  Photos    01/24/2024 10:30  Attached To:  Hospital Encounter on 01/24/24  Source Information  [place] [employer]  [facility]  Document History    Data Reviewed:  Labs on Admission:  Results for orders placed or performed during the hospital encounter of 01/24/24 (from the past 24 hours)  Resp panel by RT-PCR (RSV, Flu A&B, Covid) Anterior Nasal Swab     Status: None   Collection Time: 01/24/24 10:35 AM   Specimen: Anterior Nasal Swab  Result Value Ref Range   SARS Coronavirus 2 by RT PCR NEGATIVE NEGATIVE   Influenza A by PCR NEGATIVE NEGATIVE   Influenza B by PCR NEGATIVE NEGATIVE   Resp Syncytial Virus by PCR NEGATIVE NEGATIVE  CBC with Differential     Status: Abnormal   Collection Time: 01/24/24 10:55  AM  Result Value Ref Range   WBC 20.9 (H) 4.0 - 10.5 K/uL   RBC 4.37 4.22 - 5.81 MIL/uL   Hemoglobin 13.5 13.0 - 17.0 g/dL   HCT 28.4 13.2 - 44.0 %   MCV 90.4 80.0 - 100.0 fL   MCH 30.9 26.0 - 34.0 pg   MCHC 34.2 30.0 - 36.0 g/dL   RDW 10.2 72.5 - 36.6 %   Platelets 121 (L) 150 - 400 K/uL   nRBC 0.0 0.0 - 0.2 %   Neutrophils Relative % 90 %   Neutro Abs 18.8 (H) 1.7 - 7.7 K/uL   Lymphocytes Relative 3 %   Lymphs Abs 0.6 (L) 0.7 -  4.0 K/uL   Monocytes Relative 5 %   Monocytes Absolute 1.1 (H) 0.1 - 1.0 K/uL   Eosinophils Relative 0 %   Eosinophils Absolute 0.0 0.0 - 0.5 K/uL   Basophils Relative 0 %   Basophils Absolute 0.1 0.0 - 0.1 K/uL   Immature Granulocytes 2 %   Abs Immature Granulocytes 0.38 (H) 0.00 - 0.07 K/uL  Basic metabolic panel     Status: Abnormal   Collection Time: 01/24/24 10:55 AM  Result Value Ref Range   Sodium 132 (L) 135 - 145 mmol/L   Potassium 3.3 (L) 3.5 - 5.1 mmol/L   Chloride 100 98 - 111 mmol/L   CO2 21 (L) 22 - 32 mmol/L   Glucose, Bld 151 (H) 70 - 99 mg/dL   BUN 36 (H) 6 - 20 mg/dL   Creatinine, Ser 1.61 (H) 0.61 - 1.24 mg/dL   Calcium 8.5 (L) 8.9 - 10.3 mg/dL   GFR, Estimated 40 (L) >60 mL/min   Anion gap 11 5 - 15  Protime-INR     Status: Abnormal   Collection Time: 01/24/24 12:58 PM  Result Value Ref Range   Prothrombin Time 15.5 (H) 11.4 - 15.2 seconds   INR 1.2 0.8 - 1.2  Blood Culture (routine x 2)     Status: None (Preliminary result)   Collection Time: 01/24/24 12:58 PM   Specimen: BLOOD LEFT ARM  Result Value Ref Range   Specimen Description      BLOOD LEFT ARM Performed at [facility], [address] [place], [place] [address]    Special Requests      BOTTLES DRAWN AEROBIC AND ANAEROBIC Blood Culture adequate volume Performed at [facility], [address] [place], [place] [address]    Culture PENDING    Report Status PENDING   Blood Culture (routine x 2)     Status: None (Preliminary result)    Collection Time: 01/24/24 12:58 PM   Specimen: BLOOD RIGHT ARM  Result Value Ref Range   Specimen Description      BLOOD RIGHT ARM Performed at [facility], [address] [place], [place] [address]    Special Requests      BOTTLES DRAWN AEROBIC AND ANAEROBIC Blood Culture adequate volume Performed at [facility], [address], [place], [place] [address]    Culture PENDING    Report Status PENDING   I-Stat Lactic Acid, ED     Status: None   Collection Time: 01/24/24  1:00 PM  Result Value Ref Range   Lactic Acid, Venous 1.2 0.5 - 1.9 mmol/L  Urinalysis, w/ Reflex to Culture (Infection Suspected) -Urine, Clean Catch     Status: Abnormal   Collection Time: 01/24/24  3:24 PM  Result Value Ref Range   Specimen Source URINE, CLEAN CATCH    Color, Urine AMBER (A) YELLOW   APPearance CLOUDY (A) CLEAR   Specific Gravity, Urine 1.011 1.005 - 1.030   pH 5.0 5.0 - 8.0   Glucose, UA NEGATIVE NEGATIVE mg/dL   Hgb urine dipstick LARGE (A) NEGATIVE   Bilirubin Urine NEGATIVE NEGATIVE   Ketones, ur NEGATIVE NEGATIVE mg/dL   Protein, ur 621 (A) NEGATIVE mg/dL   Nitrite NEGATIVE NEGATIVE   Leukocytes,Ua MODERATE (A) NEGATIVE   RBC / HPF >50 0 - 5 RBC/hpf   WBC, UA >50 0 - 5 WBC/hpf   Bacteria, UA RARE (A) NONE SEEN   Squamous Epithelial / HPF 0-5 0 -  5 /HPF  I-Stat Lactic Acid, ED     Status: None   Collection Time: 01/24/24  5:04 PM  Result Value Ref Range   Lactic Acid, Venous 0.8 0.5 - 1.9 mmol/L   Basic Metabolic Panel: Recent Labs  Lab 01/24/24 1055  NA 132*  K 3.3*  CL 100  CO2 21*  GLUCOSE 151*  BUN 36*  CREATININE 1.92*  CALCIUM 8.5*   Liver Function Tests: No results for input(s): "AST", "ALT", "ALKPHOS", "BILITOT", "PROT", "ALBUMIN" in the last 168 hours. No results for input(s): "LIPASE", "AMYLASE" in the last 168 hours. No results for input(s): "AMMONIA" in the last 168 hours. CBC: Recent Labs  Lab 01/24/24 1055  WBC 20.9*   NEUTROABS 18.8*  HGB 13.5  HCT 39.5  MCV 90.4  PLT 121*   Cardiac Enzymes: No results for input(s): "CKTOTAL", "CKMB", "CKMBINDEX", "TROPONINIHS" in the last 168 hours.  BNP (last 3 results) No results for input(s): "PROBNP" in the last 8760 hours. CBG: No results for input(s): "GLUCAP" in the last 168 hours.  Radiological Exams on Admission:  DG Foot 2 Views Right Result Date: 01/24/2024 CLINICAL DATA:  Previous amputations.  Swelling.  Gangrene EXAM: RIGHT FOOT - 2 VIEW COMPARISON:  Foot x-ray 11/16/2023. FINDINGS: Interval amputation of the fifth ray including the phalanges and metatarsal. Mild soft tissue swelling. There has been previous amputation of the phalanges of the third digit and the distal half of the third metatarsal. No fracture or dislocation. Degenerative changes of the midfoot. Well corticated small plantar calcaneal spur. There is a small lucency along the distal aspect of the cuboid bone which was not clearly seen previous. This could be postsurgical but is indeterminate. If there is further concern of additional findings of bone or soft tissue infection additional workup could be performed with MRI or bone scan as clinically appropriate. IMPRESSION: Interval resection of the fifth ray. Soft tissue swelling. There is a subtle lucency along the distal aspect of the cuboid bone is not clearly seen previous but this could be postsurgical or projectional. Additional workup as clinically appropriate. Electronically Signed   By: Karen Kays M.D.   On: 01/24/2024 16:34   DG Chest Port 1 View Result Date: 01/24/2024 CLINICAL DATA:  Sepsis EXAM: PORTABLE CHEST 1 VIEW COMPARISON:  X-ray 07/10/2014. FINDINGS: Under penetrated underinflated radiograph. Slight left basilar atelectasis or scar. No consolidation, pneumothorax or effusion. No edema. Normal cardiopericardial silhouette. Overlapping cardiac leads. IMPRESSION: Limited x-ray. Persistent left basilar scar or atelectasis. No  consolidation. Electronically Signed   By: Karen Kays M.D.   On: 01/24/2024 16:05     No intake/output data recorded. Total I/O In: 1388.8 [IV Piggyback:1388.8] Out: -    Assessment and Plan: Sepsis (HCC) Present on admission, severe with associated hypotension.  Resolved with IV fluids as well as empiric vancomycin and ceftriaxone.  Symptoms were preceded by urinary hesitancy/straining as well as dark-colored urine with finding of hematuria on UA.  Blood and urine cultures are pending.  I will request CT renal study to rule out stone or other obstruction of the urinary tract given prominent hesitation as well as hematuria reported by patient.  Continue treatment with ceftriaxone. Lactate wnl.  Given skin break on the right foot, transient bacteremia under consideration.  Follow blood cultures.  History of amputation of lesser toe of right foot (HCC) Patient has history of recent amputation of fifth ray, wound has been slow to heal with some dehiscence.  See representative photo above.  Does not look grossly infected.  X-ray showed some cuboid bone lucency.  Look forward to orthopedic evaluation in this regard.  Ortho consulted by ER attending.  AKI (acute kidney injury) (HCC) Baseline creatinine is normal.  Currently 1.92.  In the setting of sepsis.  CT renal study pending.  Analysis at this time currently explained by infection.  Will monitor creatinine response to IV hydration.  Patient received 1.5 L fluid in the ER and I have ordered him for 1 L to be infused over the next 12 hours.  Troponin penidng. Med rec pending pharmcy input   Advance Care Planning:   Code Status: Prior full code.  Consults: orthopedic as above.  Family Communication: sister at bedside. All questions answered.  Severity of Illness: The appropriate patient status for this patient is INPATIENT. Inpatient status is judged to be reasonable and necessary in order to provide the required intensity of service to  ensure the patient's safety. The patient's presenting symptoms, physical exam findings, and initial radiographic and laboratory data in the context of their chronic comorbidities is felt to place them at high risk for further clinical deterioration. Furthermore, it is not anticipated that the patient will be medically stable for discharge from the hospital within 2 midnights of admission.   * I certify that at the point of admission it is my clinical judgment that the patient will require inpatient hospital care spanning beyond 2 midnights from the point of admission due to high intensity of service, high risk for further deterioration and high frequency of surveillance required.*  Author: Nolberto Hanlon, MD 01/24/2024 6:28 PM  For on call review [email or web address].

## 2024-01-24 NOTE — ED Provider Notes (Signed)
  Accepted handoff at shift change from [facility]. Please see prior provider note for more detail.   Briefly: Patient is 58 y.o. " presents ED today with a 2-day history of cough, congestion, fever, "blood in the urine".  Reporting that starting today he has had 1 episode of nausea and vomiting and 1 episode of diarrhea.  Previous medical history of acute biliary pancreatitis, AKI, pyelonephritis, osteomyelitis of great toe of left foot, gangrene of right foot currently undergoing wound care last seen last week.  Denies any sick contacts at home or work.  States he has been tolerating p.o. intake well until today where he tried to drink a protein smoothie and was unable to tolerate it with vomiting afterward.   However reports having recently started the ketogenic diet 3 days ago.  States he has been faithful to keep the area clean and use silver cell with Vashe .   Reports intermittent right lower extremity edema which is chronic if wrapped to tightly for the wound on right foot. Denies headaches, visual changes, neck pain, shortness of breath, chest pain, abdominal pain."     -admitting patient for urosepsis, AKI and possible osteomyelitis -PMHx DM, osteomyelitis s/p right 5th ray amputation by Dr. Lajoyce Corners on 11/23/2023 complicated by recent dehiscence of surgical site. - presents to ED concerned for cough, congestion, fever, hematuria, nausea, vomiting, diarrhea. Patient initially febrile at 101.32F and tachycardic at 130BMP - this resolved with tylenol and IV fluids. - upon my physical exam, patient not ill appearing or in any type of distress. - UA concerning for UTI. CBC with leukocytosis at 20.9. BMP with AKI - Cr elevated at 1.92. - patient started on Rocephin and Vanc by previous EDP.  - right foot xray showing subtle lucency of cuboid bone. - Dr. Christell Constant (orthopedic on call) requesting transfer to [facility] for Dr. Lajoyce Corners to evaluate patient in the morning. Recommends MRI in the meantime. -  shared results and plan with patient. Answered questions. Patient agrees to move forward with this plan. -Dr. Samara Deist admitting provider. Agrees to admit patient to [facility]. I appreciate his help.     .Critical Care  Performed by: Dorthy Cooler, PA-C Authorized by: Dorthy Cooler, PA-C   Critical care provider statement:    Critical care time (minutes):  30   Critical care was time spent personally by me on the following activities:  Development of treatment plan with patient or surrogate, discussions with consultants, evaluation of patient's response to treatment, examination of patient, ordering and review of laboratory studies, ordering and review of radiographic studies, ordering and performing treatments and interventions, pulse oximetry, re-evaluation of patient's condition and review of old charts   Care discussed with: admitting provider   Comments:     Consulting ortho for osteomyelitis and hospitalist for admission      Margarita Rana 01/24/24 1813    Coral Spikes, DO 01/29/24 1507

## 2024-01-24 NOTE — Assessment & Plan Note (Addendum)
 Present on admission, severe with associated hypotension.  Resolved with IV fluids as well as empiric vancomycin and ceftriaxone.  Symptoms were preceded by urinary hesitancy/straining as well as dark-colored urine with finding of hematuria on UA.  Blood and urine cultures are pending.  I will request CT renal study to rule out stone or other obstruction of the urinary tract given prominent hesitation as well as hematuria reported by patient.  Continue treatment with ceftriaxone. Lactate wnl.  Given skin break on the right foot, transient bacteremia under consideration.  Follow blood cultures.

## 2024-01-24 NOTE — ED Notes (Signed)
 Called [facility] to provide report, they informed me RN is currently busy and took my callback number, will give report when they return call.

## 2024-01-24 NOTE — ED Notes (Signed)
 ED TO INPATIENT HANDOFF REPORT  ED Nurse Name and Phone #:  Pieter Partridge EMT-P [phone]  S Name/Age/Gender Evan Hamilton 58 y.o. male Room/Bed: WA04/WA04  Code Status   Code Status: Full Code  Home/SNF/Other Home Patient oriented to: self, place, time, and situation Is this baseline? Yes   Triage Complete: Triage complete  Chief Complaint Sepsis [facility]) [A41.9]  Triage Note Pt reports flu like symptoms for a few days. Febrile in triage.    Allergies Allergies  Allergen Reactions   Penicillins Hives, Rash and Other (See Comments)    Tolerates Cephalosporins    Level of Care/Admitting Diagnosis ED Disposition     ED Disposition  Admit   Condition  --   Comment  Hospital Area: [facility] [[id number]]  Level of Care: Telemetry Medical [104]  May admit patient to [facility] or [facility] if equivalent level of care is available:: No  Covid Evaluation: Asymptomatic - no recent exposure (last 10 days) testing not required  Diagnosis: Sepsis [facility]) [[id number]]  Admitting Physician: Nolberto Hanlon [[id number]]  Attending Physician: Nolberto Hanlon [[id number]]  Certification:: I certify this patient will need inpatient services for at least 2 midnights  Expected Medical Readiness: 01/26/2024          B Medical/Surgery History Past Medical History:  Diagnosis Date   Allergy    Anxiety and depression    Diabetes mellitus without complication (HCC)    Hx of diabetes - patient has lost weight, no meds, patient states "not a diabetic on 11/22/23"   History of kidney stones    passed stones   Sleep apnea    does not wear CPAP - lost weight   Past Surgical History:  Procedure Laterality Date   AMPUTATION Left 05/22/2021   Procedure: GREAT TOE AMPUTATION;  Surgeon: Kathryne Hitch, MD;  Location: [facility] OR;  Service: Orthopedics;  Laterality: Left;   AMPUTATION Right 05/03/2022   Procedure: RIGHT THIRD TOE AMPUTATION;  Surgeon: Nadara Mustard, MD;  Location: [facility] OR;   Service: Orthopedics;  Laterality: Right;   AMPUTATION Right 11/23/2023   Procedure: RIGHT 5TH RAY AMPUTATION;  Surgeon: Nadara Mustard, MD;  Location: [facility] OR;  Service: Orthopedics;  Laterality: Right;   CHOLECYSTECTOMY N/A 07/22/2014   Procedure: LAPAROSCOPIC CHOLECYSTECTOMY WITH INTRAOPERATIVE CHOLANGIOGRAM;  Surgeon: Harriette Bouillon, MD;  Location: [facility] OR;  Service: General;  Laterality: N/A;     A IV Location/Drains/Wounds Patient Lines/Drains/Airways Status     Active Line/Drains/Airways     Name Placement date Placement time Site Days   Peripheral IV 01/24/24 20 G 1" Anterior;Right Forearm 01/24/24  1153  Forearm  less than 1   Peripheral IV 01/24/24 Anterior;Left Forearm 01/24/24  1248  Forearm  less than 1   Incision - 4 Ports Abdomen 1: Umbilicus 2: Mid;Upper 3: Right;Lateral 4: Right;Medial 07/22/14  1018  -- 3473   Wound / Incision (Open or Dehisced) 05/21/21 Non-pressure wound Toe (Comment  which one) Left 05/21/21  1545  Toe (Comment  which one)  978            Intake/Output Last 24 hours  Intake/Output Summary (Last 24 hours) at 01/24/2024 1945 Last data filed at 01/24/2024 1552 Gross per 24 hour  Intake 1388.79 ml  Output --  Net 1388.79 ml    Labs/Imaging Results for orders placed or performed during the hospital encounter of 01/24/24 (from the past 48 hours)  Resp panel by RT-PCR (RSV, Flu A&B, Covid) Anterior Nasal Swab  Status: None   Collection Time: 01/24/24 10:35 AM   Specimen: Anterior Nasal Swab  Result Value Ref Range   SARS Coronavirus 2 by RT PCR NEGATIVE NEGATIVE    Comment: (NOTE) SARS-CoV-2 target nucleic acids are NOT DETECTED.  The SARS-CoV-2 RNA is generally detectable in upper respiratory specimens during the acute phase of infection. The lowest concentration of SARS-CoV-2 viral copies this assay can detect is 138 copies/mL. A negative result does not preclude SARS-Cov-2 infection and should not be used as the sole basis for treatment  or other patient management decisions. A negative result may occur with  improper specimen collection/handling, submission of specimen other than nasopharyngeal swab, presence of viral mutation(s) within the areas targeted by this assay, and inadequate number of viral copies(<138 copies/mL). A negative result must be combined with clinical observations, patient history, and epidemiological information. The expected result is Negative.  Fact Sheet for Patients:  BloggerCourse.com  Fact Sheet for Healthcare Providers:  SeriousBroker.it  This test is no t yet approved or cleared by the [place] FDA and  has been authorized for detection and/or diagnosis of SARS-CoV-2 by FDA under an Emergency Use Authorization (EUA). This EUA will remain  in effect (meaning this test can be used) for the duration of the COVID-19 declaration under Section 564(b)(1) of the Act, 21 U.S.C.section 360bbb-3(b)(1), unless the authorization is terminated  or revoked sooner.       Influenza A by PCR NEGATIVE NEGATIVE   Influenza B by PCR NEGATIVE NEGATIVE    Comment: (NOTE) The Xpert Xpress SARS-CoV-2/FLU/RSV plus assay is intended as an aid in the diagnosis of influenza from Nasopharyngeal swab specimens and should not be used as a sole basis for treatment. Nasal washings and aspirates are unacceptable for Xpert Xpress SARS-CoV-2/FLU/RSV testing.  Fact Sheet for Patients: BloggerCourse.com  Fact Sheet for Healthcare Providers: SeriousBroker.it  This test is not yet approved or cleared by the [place] FDA and has been authorized for detection and/or diagnosis of SARS-CoV-2 by FDA under an Emergency Use Authorization (EUA). This EUA will remain in effect (meaning this test can be used) for the duration of the COVID-19 declaration under Section 564(b)(1) of the Act, 21 U.S.C. section  360bbb-3(b)(1), unless the authorization is terminated or revoked.     Resp Syncytial Virus by PCR NEGATIVE NEGATIVE    Comment: (NOTE) Fact Sheet for Patients: BloggerCourse.com  Fact Sheet for Healthcare Providers: SeriousBroker.it  This test is not yet approved or cleared by the [place] FDA and has been authorized for detection and/or diagnosis of SARS-CoV-2 by FDA under an Emergency Use Authorization (EUA). This EUA will remain in effect (meaning this test can be used) for the duration of the COVID-19 declaration under Section 564(b)(1) of the Act, 21 U.S.C. section 360bbb-3(b)(1), unless the authorization is terminated or revoked.  Performed at [facility], [address], [place], [place] [address]   CBC with Differential     Status: Abnormal   Collection Time: 01/24/24 10:55 AM  Result Value Ref Range   WBC 20.9 (H) 4.0 - 10.5 K/uL   RBC 4.37 4.22 - 5.81 MIL/uL   Hemoglobin 13.5 13.0 - 17.0 g/dL   HCT 32.4 40.1 - 02.7 %   MCV 90.4 80.0 - 100.0 fL   MCH 30.9 26.0 - 34.0 pg   MCHC 34.2 30.0 - 36.0 g/dL   RDW 25.3 66.4 - 40.3 %   Platelets 121 (L) 150 - 400 K/uL   nRBC 0.0 0.0 -  0.2 %   Neutrophils Relative % 90 %   Neutro Abs 18.8 (H) 1.7 - 7.7 K/uL   Lymphocytes Relative 3 %   Lymphs Abs 0.6 (L) 0.7 - 4.0 K/uL   Monocytes Relative 5 %   Monocytes Absolute 1.1 (H) 0.1 - 1.0 K/uL   Eosinophils Relative 0 %   Eosinophils Absolute 0.0 0.0 - 0.5 K/uL   Basophils Relative 0 %   Basophils Absolute 0.1 0.0 - 0.1 K/uL   Immature Granulocytes 2 %   Abs Immature Granulocytes 0.38 (H) 0.00 - 0.07 K/uL    Comment: Performed at [facility], [address], [place], [place] [address]  Basic metabolic panel     Status: Abnormal   Collection Time: 01/24/24 10:55 AM  Result Value Ref Range   Sodium 132 (L) 135 - 145 mmol/L   Potassium 3.3 (L) 3.5 - 5.1 mmol/L   Chloride 100 98 -  111 mmol/L   CO2 21 (L) 22 - 32 mmol/L   Glucose, Bld 151 (H) 70 - 99 mg/dL    Comment: Glucose reference range applies only to samples taken after fasting for at least 8 hours.   BUN 36 (H) 6 - 20 mg/dL   Creatinine, Ser 4.69 (H) 0.61 - 1.24 mg/dL   Calcium 8.5 (L) 8.9 - 10.3 mg/dL   GFR, Estimated 40 (L) >60 mL/min    Comment: (NOTE) Calculated using the CKD-EPI Creatinine Equation (2021)    Anion gap 11 5 - 15    Comment: Performed at [facility], [address], [place], [place] [address]  Protime-INR     Status: Abnormal   Collection Time: 01/24/24 12:58 PM  Result Value Ref Range   Prothrombin Time 15.5 (H) 11.4 - 15.2 seconds   INR 1.2 0.8 - 1.2    Comment: (NOTE) INR goal varies based on device and disease states. Performed at [facility], [address] [place], [place] [address]   Blood Culture (routine x 2)     Status: None (Preliminary result)   Collection Time: 01/24/24 12:58 PM   Specimen: BLOOD LEFT ARM  Result Value Ref Range   Specimen Description      BLOOD LEFT ARM Performed at [facility], [address], [place], [place] [address]    Special Requests      BOTTLES DRAWN AEROBIC AND ANAEROBIC Blood Culture adequate volume Performed at [facility], [address], [place], [place] [address]    Culture PENDING    Report Status PENDING   Blood Culture (routine x 2)     Status: None (Preliminary result)   Collection Time: 01/24/24 12:58 PM   Specimen: BLOOD RIGHT ARM  Result Value Ref Range   Specimen Description      BLOOD RIGHT ARM Performed at [facility], [address], [place], [place] [address]    Special Requests      BOTTLES DRAWN AEROBIC AND ANAEROBIC Blood Culture adequate volume Performed at [facility], [address], [place], [place] [address]    Culture PENDING    Report Status PENDING   I-Stat Lactic Acid, ED     Status: None   Collection  Time: 01/24/24  1:00 PM  Result Value Ref Range   Lactic Acid, Venous 1.2 0.5 - 1.9 mmol/L  Urinalysis, w/ Reflex to Culture (Infection Suspected) -Urine, Clean Catch     Status: Abnormal   Collection Time: 01/24/24  3:24 PM  Result Value Ref Range   Specimen Source URINE, CLEAN CATCH    Color, Urine AMBER (A) YELLOW    Comment: BIOCHEMICALS MAY BE AFFECTED BY COLOR   APPearance CLOUDY (A) CLEAR   Specific Gravity, Urine 1.011 1.005 - 1.030   pH 5.0 5.0 - 8.0   Glucose, UA NEGATIVE NEGATIVE mg/dL   Hgb urine dipstick LARGE (A) NEGATIVE   Bilirubin Urine NEGATIVE NEGATIVE   Ketones, ur NEGATIVE NEGATIVE mg/dL   Protein, ur 191 (A) NEGATIVE mg/dL   Nitrite NEGATIVE NEGATIVE   Leukocytes,Ua MODERATE (A) NEGATIVE   RBC / HPF >50 0 - 5 RBC/hpf   WBC, UA >50 0 - 5 WBC/hpf    Comment:        Reflex urine culture not performed if WBC <=10, OR if Squamous epithelial cells >5. If Squamous epithelial cells >5 suggest recollection.    Bacteria, UA RARE (A) NONE SEEN   Squamous Epithelial / HPF 0-5 0 - 5 /HPF    Comment: Performed at [facility], [address], [place], [place] [address]  Urine Culture     Status: None (Preliminary result)   Collection Time: 01/24/24  3:24 PM   Specimen: Urine, Random  Result Value Ref Range   Specimen Description      URINE, RANDOM Performed at [facility], [address], [place], [place] [address]    Special Requests      URINE, CLEAN CATCH Performed at [facility], [address], [place], [place] [address]    Culture PENDING    Report Status PENDING   I-Stat Lactic Acid, ED     Status: None   Collection Time: 01/24/24  5:04 PM  Result Value Ref Range   Lactic Acid, Venous 0.8 0.5 - 1.9 mmol/L   DG Foot 2 Views Right Result Date: 01/24/2024 CLINICAL DATA:  Previous amputations.  Swelling.  Gangrene EXAM: RIGHT FOOT - 2 VIEW COMPARISON:  Foot x-ray 11/16/2023. FINDINGS: Interval amputation of the  fifth ray including the phalanges and metatarsal. Mild soft tissue swelling. There has been previous amputation of the phalanges of the third digit and the distal half of the third metatarsal. No fracture or dislocation. Degenerative changes of the midfoot. Well corticated small plantar calcaneal spur. There is a small lucency along the distal aspect of the cuboid bone which was not clearly seen previous. This could be postsurgical but is indeterminate. If there is further concern of additional findings of bone or soft tissue infection additional workup could be performed with MRI or bone scan as clinically appropriate. IMPRESSION: Interval resection of the fifth ray. Soft tissue swelling. There is a subtle lucency along the distal aspect of the cuboid bone is not clearly seen previous but this could be postsurgical or projectional. Additional workup as clinically appropriate. Electronically Signed   By: Karen Kays M.D.   On: 01/24/2024 16:34   DG Chest Port 1 View Result Date: 01/24/2024 CLINICAL DATA:  Sepsis EXAM: PORTABLE CHEST 1 VIEW COMPARISON:  X-ray 07/10/2014. FINDINGS: Under penetrated underinflated radiograph. Slight left basilar atelectasis or scar. No consolidation, pneumothorax or effusion. No edema. Normal cardiopericardial silhouette. Overlapping cardiac leads. IMPRESSION: Limited x-ray. Persistent left basilar scar or atelectasis. No consolidation. Electronically Signed   By: Karen Kays M.D.   On: 01/24/2024 16:05    Pending Labs Unresulted Labs (From admission, onward)     Start     Ordered   01/31/24 0500  Creatinine, serum  (enoxaparin (LOVENOX)    CrCl >/= 30 ml/min)  Weekly,   R     Comments: while on enoxaparin therapy    01/24/24 1839   01/25/24 0500  APTT  Tomorrow morning,   R        01/24/24 1839   01/25/24 0500  Protime-INR  Tomorrow morning,   R        01/24/24 1839   01/25/24 0500  Basic metabolic panel  Tomorrow morning,   R        01/24/24 1839   01/25/24 0500   CBC  Tomorrow morning,   R        01/24/24 1839            Vitals/Pain Today's Vitals   01/24/24 1700 01/24/24 1745 01/24/24 1800 01/24/24 1936  BP: 92/60 102/62 (!) 122/95 107/63  Pulse: 75 78 77 75  Resp:   14 18  Temp:   97.6 F (36.4 C)   TempSrc:   Oral Oral  SpO2: 100% 92% 98% 99%  Weight:      Height:      PainSc:    0-No pain    Isolation Precautions No active isolations  Medications Medications  cefTRIAXone (ROCEPHIN) 2 g in sodium chloride 0.9 % 100 mL IVPB (0 g Intravenous Stopped 01/24/24 1338)  enoxaparin (LOVENOX) injection 80 mg (has no administration in time range)  acetaminophen (TYLENOL) tablet 650 mg (has no administration in time range)    Or  acetaminophen (TYLENOL) suppository 650 mg (has no administration in time range)  polyethylene glycol (MIRALAX / GLYCOLAX) packet 17 g (has no administration in time range)  lactated ringers infusion ( Intravenous New Bag/Given 01/24/24 1941)  sodium chloride flush (NS) 0.9 % injection 3 mL (has no administration in time range)  ondansetron (ZOFRAN-ODT) disintegrating tablet 4 mg (4 mg Oral Given 01/24/24 1052)  acetaminophen (TYLENOL) tablet 1,000 mg (1,000 mg Oral Given 01/24/24 1052)  sodium chloride 0.9 % bolus 500 mL (0 mLs Intravenous Stopped 01/24/24 1253)  vancomycin (VANCOREADY) IVPB 2000 mg/400 mL (0 mg Intravenous Stopped 01/24/24 1526)  sodium chloride 0.9 % bolus 1,000 mL (0 mLs Intravenous Stopped 01/24/24 1552)  potassium chloride SA (KLOR-CON M) CR tablet 40 mEq (40 mEq Oral Given 01/24/24 1737)    Mobility walks     Focused Assessments    R Recommendations: See Admitting Provider Note  Report given to:   Additional Notes:

## 2024-01-24 NOTE — ED Notes (Signed)
Consulted provider at bedside

## 2024-01-24 NOTE — Assessment & Plan Note (Signed)
 Patient has history of recent amputation of fifth ray, wound has been slow to heal with some dehiscence.  See representative photo above.  Does not look grossly infected.  X-ray showed some cuboid bone lucency.  Look forward to orthopedic evaluation in this regard.  Ortho consulted by ER attending.

## 2024-01-24 NOTE — Assessment & Plan Note (Signed)
 Baseline creatinine is normal.  Currently 1.92.  In the setting of sepsis.  CT renal study pending.  Analysis at this time currently explained by infection.  Will monitor creatinine response to IV hydration.  Patient received 1.5 L fluid in the ER and I have ordered him for 1 L to be infused over the next 12 hours.

## 2024-01-25 DIAGNOSIS — N390 Urinary tract infection, site not specified: Secondary | ICD-10-CM

## 2024-01-25 DIAGNOSIS — M869 Osteomyelitis, unspecified: Secondary | ICD-10-CM | POA: Diagnosis not present

## 2024-01-25 DIAGNOSIS — M86271 Subacute osteomyelitis, right ankle and foot: Secondary | ICD-10-CM | POA: Diagnosis not present

## 2024-01-25 DIAGNOSIS — N179 Acute kidney failure, unspecified: Secondary | ICD-10-CM | POA: Diagnosis not present

## 2024-01-25 LAB — BASIC METABOLIC PANEL
Anion gap: 8 (ref 5–15)
BUN: 41 mg/dL — ABNORMAL HIGH (ref 6–20)
CO2: 21 mmol/L — ABNORMAL LOW (ref 22–32)
Calcium: 7.6 mg/dL — ABNORMAL LOW (ref 8.9–10.3)
Chloride: 104 mmol/L (ref 98–111)
Creatinine, Ser: 2.31 mg/dL — ABNORMAL HIGH (ref 0.61–1.24)
GFR, Estimated: 32 mL/min — ABNORMAL LOW (ref >60)
Glucose, Bld: 103 mg/dL — ABNORMAL HIGH (ref 70–99)
Potassium: 3.8 mmol/L (ref 3.5–5.1)
Sodium: 133 mmol/L — ABNORMAL LOW (ref 135–145)

## 2024-01-25 LAB — CBC
HCT: 36.1 % — ABNORMAL LOW (ref 39.0–52.0)
Hemoglobin: 12.5 g/dL — ABNORMAL LOW (ref 13.0–17.0)
MCH: 31.3 pg (ref 26.0–34.0)
MCHC: 34.6 g/dL (ref 30.0–36.0)
MCV: 90.5 fL (ref 80.0–100.0)
Platelets: 107 10*3/uL — ABNORMAL LOW (ref 150–400)
RBC: 3.99 MIL/uL — ABNORMAL LOW (ref 4.22–5.81)
RDW: 13.5 % (ref 11.5–15.5)
WBC: 14.9 10*3/uL — ABNORMAL HIGH (ref 4.0–10.5)
nRBC: 0 % (ref 0.0–0.2)

## 2024-01-25 LAB — URINE CULTURE: Culture: <10000 — AB

## 2024-01-25 LAB — APTT: aPTT: 28 s (ref 24–36)

## 2024-01-25 LAB — PROTIME-INR
INR: 1.2 (ref 0.8–1.2)
Prothrombin Time: 15.5 s — ABNORMAL HIGH (ref 11.4–15.2)

## 2024-01-25 MED ORDER — SODIUM CHLORIDE 0.9 % IV BOLUS
500.0000 mL | Freq: Once | INTRAVENOUS | Status: AC
  Administered 2024-01-25: 500 mL via INTRAVENOUS

## 2024-01-25 MED ORDER — SODIUM CHLORIDE 0.9 % IV SOLN: INTRAVENOUS | Status: AC

## 2024-01-25 NOTE — TOC CM/SW Note (Signed)
 Transition of Care [facility]) - Inpatient Brief Assessment   Patient Details  Name: Evan Hamilton MRN: [id number] Date of Birth: Apr 22, 1966  Transition of Care [facility]) CM/SW Contact:    Mearl Latin, LCSW Phone Number: 01/25/2024, 3:39 PM   Clinical Narrative: Patient admitted from home, seen by Ortho. No current TOC needs identified at this time. Please place consult if needed.   Transition of Care Asessment: Insurance and Status: Insurance coverage has been reviewed Patient has primary care physician: No Home environment has been reviewed: From home Prior level of function:: Independent Prior/Current Home Services: No current home services Social Drivers of Health Review: SDOH reviewed no interventions necessary Readmission risk has been reviewed: Yes Transition of care needs: no transition of care needs at this time

## 2024-01-25 NOTE — Plan of Care (Signed)
?  Problem: Education: ?Goal: Knowledge of General Education information will improve ?Description: Including pain rating scale, medication(s)/side effects and non-pharmacologic comfort measures ?Outcome: Progressing ?  ?Problem: Health Behavior/Discharge Planning: ?Goal: Ability to manage health-related needs will improve ?Outcome: Progressing ?  ?Problem: Clinical Measurements: ?Goal: Ability to maintain clinical measurements within normal limits will improve ?Outcome: Progressing ?  ?Problem: Clinical Measurements: ?Goal: Cardiovascular complication will be avoided ?Outcome: Progressing ?  ?Problem: Activity: ?Goal: Risk for activity intolerance will decrease ?Outcome: Progressing ?  ?

## 2024-01-25 NOTE — Consult Note (Signed)
 WOC Nurse Consult Note: patient is followed by Dr. 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/23/2023; last seen 01/16/2024 for R foot wound dehiscence following amputation; was ordered for Vashe and silver cell  Reason for Consult: R foot wound  Wound type: full thickness postop as above  Pressure Injury POA: NA  Measurement: see nursing flowsheet; per ortho note 2/26 4 cm x 1.5 cm x 4 mm deep; undermining toward plantar aspect of foot about 5 mm  Wound bed: 50% pink, 50% yellow fibrin  Drainage (amount, consistency, odor) tan exudate noted on old bandage (no purulence per H&P)  Periwound: dry  Dressing procedure/placement/frequency: Cleanse R foot wound with Vashe wound cleanser [employer] [place] [phone]), cut a piece of silver hydrofiber (Aquacel Coralee [place] [phone]) to fit wound bed, using a Q tip applicator apply silver hydrofiber to wound bed daily, cover with dry gauze and secure with Kerlix roll gauze.   Dr. Lajoyce Corners has been consulted on this patient. Any wound care orders placed by Dr. Lajoyce Corners supercede wound care orders placed by [facility] nurse.   WOC team will not follow.  Re-consult if further needs arise.   Thank you,    Priscella Mann MSN, RN-BC, [employer] [phone]

## 2024-01-25 NOTE — Progress Notes (Signed)
 [facility] PROGRESS NOTE   Evan Hamilton ZOX:[id number] DOB: 02/08/1966 DOA: 01/24/2024  PCP: Patient, No Pcp Per  Brief History: 59 y.o. 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 Complicated by wound dehisences . Sees ortho regularly.  Came in with complaints of difficulty urinating.  Developed fever.  Noted to have abnormal UA in the ER.  Was hospitalized for further management.   Consultants: Orthopedics  Procedures: None yet    Subjective/Interval History: Patient mentions that he is feeling a bit stronger compared to yesterday but still feels fatigued.  Feels dehydrated.  Denies any significant pain in the right foot.  Continues to have frequent urination.    Assessment/Plan:  Acute urinary tract infection with sepsis, present on admission Cultures are pending.  UA noted to be abnormal.  Patient was started on ceftriaxone. WBC noted to be 14.9 which is improved from 20.9 yesterday.  Noted to be afebrile this morning.  Lactic acid level was normal.  Acute kidney injury/hyponatremia Baseline creatinine is normal.  Presented with creatinine of 1.92 which has actually increased to 2.31 today.  Will give him IV fluid bolus and started him on maintenance IV fluids.  Avoid nephrotoxic agents.  Monitor urine output.  Recheck labs tomorrow. He underwent CT renal stone study which did not show any hydronephrosis.  No evidence for nephrolithiasis.  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 Underwent MRI which showed abnormal findings. Orthopedics will be consulted.  Check ESR CRP. Wound care following as well.  Normocytic anemia/thrombocytopenia. No evidence for overt bleeding.  Monitor closely.  Obesity Estimated body mass index is 44.18 kg/m as calculated from the following:   Height as of this encounter: 6'  2" (1.88 m).   Weight as of this encounter: 156.1 kg.  DVT Prophylaxis: Lovenox Code Status: Full code Family Communication: Discussed with the patient Disposition Plan: Hopefully return home when improved  Status is: Inpatient Remains inpatient appropriate because: Need for IV antibiotics      Medications: Scheduled:  enoxaparin (LOVENOX) injection  80 mg Subcutaneous Q24H   sodium chloride flush  3 mL Intravenous Q12H   Continuous:  sodium chloride 100 mL/hr at 01/25/24 0826   cefTRIAXone (ROCEPHIN)  IV Stopped (01/24/24 1338)   WJX:BJYNWGNFAOZHY **OR** acetaminophen, polyethylene glycol  Antibiotics: Anti-infectives (From admission, onward)    Start     Dose/Rate Route Frequency Ordered Stop   01/24/24 1300  vancomycin (VANCOCIN) IVPB 1000 mg/200 mL premix  Status:  Discontinued        1,000 mg 200 mL/hr over 60 Minutes Intravenous  Once 01/24/24 1256 01/24/24 1259   01/24/24 1300  cefTRIAXone (ROCEPHIN) 2 g in sodium chloride 0.9 % 100 mL IVPB        2 g 200 mL/hr over 30 Minutes Intravenous Every 24 hours 01/24/24 1256     01/24/24 1300  vancomycin (VANCOREADY) IVPB 2000 mg/400 mL        2,000 mg 200 mL/hr over 120 Minutes Intravenous  Once 01/24/24 1259 01/24/24 1526       Objective:  Vital Signs  Vitals:   01/24/24 2200 01/25/24 0000 01/25/24 0400 01/25/24 0808  BP: 104/67 98/69 115/76 109/71  Pulse: 77 80 90 99  Resp: 20 20 (!) 24 18  Temp: 97.9 F (36.6 C) 98.4 F (36.9 C) 98.2 F (36.8 C) 99.5 F (37.5  C)  TempSrc: Oral Oral Oral Oral  SpO2: 96% 96% 94% 95%  Weight:      Height:        Intake/Output Summary (Last 24 hours) at 01/25/2024 1002 Last data filed at 01/24/2024 1552 Gross per 24 hour  Intake 1388.79 ml  Output --  Net 1388.79 ml   Filed Weights   01/24/24 1009 01/24/24 1016 01/24/24 2138  Weight: (!) 154.2 kg (!) 154 kg (!) 156.1 kg    General appearance: Awake alert.  In no distress Resp: Clear to auscultation bilaterally.   Normal effort Cardio: S1-S2 is normal regular.  No S3-S4.  No rubs murmurs or bruit GI: Abdomen is soft.  Nontender nondistended.  Bowel sounds are present normal.  No masses organomegaly Extremities: Right foot covered in dressing Neurologic: Alert and oriented x3.  No focal neurological deficits.    Lab Results:  Data Reviewed: I have personally reviewed following labs and reports of the imaging studies  CBC: Recent Labs  Lab 01/24/24 1055 01/25/24 0538  WBC 20.9* 14.9*  NEUTROABS 18.8*  --   HGB 13.5 12.5*  HCT 39.5 36.1*  MCV 90.4 90.5  PLT 121* 107*    Basic Metabolic Panel: Recent Labs  Lab 01/24/24 1055 01/25/24 0538  NA 132* 133*  K 3.3* 3.8  CL 100 104  CO2 21* 21*  GLUCOSE 151* 103*  BUN 36* 41*  CREATININE 1.92* 2.31*  CALCIUM 8.5* 7.6*    GFR: Estimated Creatinine Clearance: 56.5 mL/min (A) (by C-G formula based on SCr of 2.31 mg/dL (H)).   Coagulation Profile: Recent Labs  Lab 01/24/24 1258 01/25/24 0538  INR 1.2 1.2     Recent Results (from the past 240 hours)  Resp panel by RT-PCR (RSV, Flu A&B, Covid) Anterior Nasal Swab     Status: None   Collection Time: 01/24/24 10:35 AM   Specimen: Anterior Nasal Swab  Result Value Ref Range Status   SARS Coronavirus 2 by RT PCR NEGATIVE NEGATIVE Final    Comment: (NOTE) SARS-CoV-2 target nucleic acids are NOT DETECTED.  The SARS-CoV-2 RNA is generally detectable in upper respiratory specimens during the acute phase of infection. The lowest concentration of SARS-CoV-2 viral copies this assay can detect is 138 copies/mL. A negative result does not preclude SARS-Cov-2 infection and should not be used as the sole basis for treatment or other patient management decisions. A negative result may occur with  improper specimen collection/handling, submission of specimen other than nasopharyngeal swab, presence of viral mutation(s) within the areas targeted by this assay, and inadequate number of  viral copies(<138 copies/mL). A negative result must be combined with clinical observations, patient history, and epidemiological information. The expected result is Negative.  Fact Sheet for Patients:  BloggerCourse.com  Fact Sheet for Healthcare Providers:  SeriousBroker.it  This test is no t yet approved or cleared by the [place] FDA and  has been authorized for detection and/or diagnosis of SARS-CoV-2 by FDA under an Emergency Use Authorization (EUA). This EUA will remain  in effect (meaning this test can be used) for the duration of the COVID-19 declaration under Section 564(b)(1) of the Act, 21 U.S.C.section 360bbb-3(b)(1), unless the authorization is terminated  or revoked sooner.       Influenza A by PCR NEGATIVE NEGATIVE Final   Influenza B by PCR NEGATIVE NEGATIVE Final    Comment: (NOTE) The Xpert Xpress SARS-CoV-2/FLU/RSV plus assay is intended as an aid in the diagnosis of influenza from Nasopharyngeal swab specimens  and should not be used as a sole basis for treatment. Nasal washings and aspirates are unacceptable for Xpert Xpress SARS-CoV-2/FLU/RSV testing.  Fact Sheet for Patients: BloggerCourse.com  Fact Sheet for Healthcare Providers: SeriousBroker.it  This test is not yet approved or cleared by the [place] FDA and has been authorized for detection and/or diagnosis of SARS-CoV-2 by FDA under an Emergency Use Authorization (EUA). This EUA will remain in effect (meaning this test can be used) for the duration of the COVID-19 declaration under Section 564(b)(1) of the Act, 21 U.S.C. section 360bbb-3(b)(1), unless the authorization is terminated or revoked.     Resp Syncytial Virus by PCR NEGATIVE NEGATIVE Final    Comment: (NOTE) Fact Sheet for Patients: BloggerCourse.com  Fact Sheet for Healthcare  Providers: SeriousBroker.it  This test is not yet approved or cleared by the [place] FDA and has been authorized for detection and/or diagnosis of SARS-CoV-2 by FDA under an Emergency Use Authorization (EUA). This EUA will remain in effect (meaning this test can be used) for the duration of the COVID-19 declaration under Section 564(b)(1) of the Act, 21 U.S.C. section 360bbb-3(b)(1), unless the authorization is terminated or revoked.  Performed at [facility], [address] [place], [place] [address]   Blood Culture (routine x 2)     Status: None (Preliminary result)   Collection Time: 01/24/24 12:58 PM   Specimen: BLOOD LEFT ARM  Result Value Ref Range Status   Specimen Description   Final    BLOOD LEFT ARM Performed at [facility], [address] [place], [place] [address]    Special Requests   Final    BOTTLES DRAWN AEROBIC AND ANAEROBIC Blood Culture adequate volume Performed at [facility], [address], [place], [place] [address]    Culture PENDING  Incomplete   Report Status PENDING  Incomplete  Blood Culture (routine x 2)     Status: None (Preliminary result)   Collection Time: 01/24/24 12:58 PM   Specimen: BLOOD RIGHT ARM  Result Value Ref Range Status   Specimen Description   Final    BLOOD RIGHT ARM Performed at [facility], [address], [place], [place] [address]    Special Requests   Final    BOTTLES DRAWN AEROBIC AND ANAEROBIC Blood Culture adequate volume Performed at [facility], [address], [place], [place] [address]    Culture PENDING  Incomplete   Report Status PENDING  Incomplete  Urine Culture     Status: None (Preliminary result)   Collection Time: 01/24/24  3:24 PM   Specimen: Urine, Random  Result Value Ref Range Status   Specimen Description   Final    URINE, RANDOM Performed at [facility], [address] [place], [place] [address]    Special Requests   Final    URINE, CLEAN CATCH Performed at [facility], [address] [place], [place] [address]    Culture PENDING  Incomplete   Report Status PENDING  Incomplete      Radiology Studies: MR FOOT RIGHT W WO CONTRAST Result Date: 01/25/2024 CLINICAL DATA:  Slow healing surgical wound after fifth ray amputation. EXAM: MRI OF THE RIGHT FOREFOOT WITHOUT AND WITH CONTRAST TECHNIQUE: Multiplanar, multisequence MR imaging of the right forefoot was performed before and after the administration of intravenous contrast. CONTRAST:  10mL GADAVIST GADOBUTROL 1 MMOL/ML IV SOLN COMPARISON:  Right foot x-rays dated January 24, 2024. FINDINGS:  Bones/Joint/Cartilage Prominent marrow edema with corresponding decreased T1 marrow signal and bony erosion involving the cuboid. Associated marrow enhancement. Milder marrow edema with associated enhancement involving the fourth metatarsal base. Prior third and fifth ray amputations. Apparent increased T2 signal and enhancement at the tip of the first distal phalanx is likely artifactual and due to poor fat saturation given no associated marrow signal abnormality STIR sequence. No fracture or dislocation. Talonavicular degenerative changes. No joint effusion. Mild pes planus. Ligaments Lisfranc ligament is intact. Muscles and Tendons No tenosynovitis.  Atrophy of the intrinsic foot muscles. Soft tissue Soft tissue ulceration along the lateral midfoot at the level of the cuboid. Surrounding soft tissue swelling and enhancement. No fluid collection. No soft tissue mass. IMPRESSION: 1. 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 No abscess. 2. Apparent increased T2 signal and enhancement at the tip of the first distal phalanx is likely artifactual and due to poor fat saturation given no associated marrow signal abnormality STIR sequence. Correlate  with physical exam to exclude great toe skin breakdown. Electronically Signed   By: Obie Dredge M.D.   On: 01/25/2024 08:48   CT RENAL STONE STUDY Result Date: 01/24/2024 CLINICAL DATA:  Flu like symptoms. EXAM: CT ABDOMEN AND PELVIS WITHOUT CONTRAST TECHNIQUE: Multidetector CT imaging of the abdomen and pelvis was performed following the standard protocol without IV contrast. RADIATION DOSE REDUCTION: This exam was performed according to the departmental dose-optimization program which includes automated exposure control, adjustment of the mA and/or kV according to patient size and/or use of iterative reconstruction technique. COMPARISON:  November 25, 2016 FINDINGS: Lower chest: Mild atelectatic changes are seen within the posterior aspect of the left lung base peer Hepatobiliary: No focal liver abnormality is seen. Status post cholecystectomy. No biliary dilatation. Pancreas: Unremarkable. No pancreatic ductal dilatation or surrounding inflammatory changes. Spleen: Normal in size without focal abnormality. Adrenals/Urinary Tract: Adrenal glands are unremarkable. Kidneys are normal in size, without renal calculi or hydronephrosis. A 3.6 cm diameter cyst is seen within the lower pole of the left kidney. Bladder is unremarkable. Stomach/Bowel: Stomach is within normal limits. Appendix appears normal. No evidence of bowel wall thickening, distention, or inflammatory changes. Vascular/Lymphatic: Aortic atherosclerosis. No enlarged abdominal or pelvic lymph nodes. Reproductive: The prostate gland is mildly enlarged. Other: A 4.2 cm x 5.5 cm x 8.8 cm fat containing umbilical hernia is noted. No abdominopelvic ascites. Musculoskeletal: A chronic compression fracture deformity is seen at the level of T12. Multilevel degenerative changes are noted throughout the lumbar spine. IMPRESSION: 1. Evidence of prior cholecystectomy. 2. Large fat-containing umbilical hernia. 3. Chronic compression fracture deformity at the  level of T12. 4. Aortic atherosclerosis. Aortic Atherosclerosis (ICD10-I70.0). Electronically Signed   By: Aram Candela M.D.   On: 01/24/2024 21:57   DG Foot 2 Views Right Result Date: 01/24/2024 CLINICAL DATA:  Previous amputations.  Swelling.  Gangrene EXAM: RIGHT FOOT - 2 VIEW COMPARISON:  Foot x-ray 11/16/2023. FINDINGS: Interval amputation of the fifth ray including the phalanges and metatarsal. Mild soft tissue swelling. There has been previous amputation of the phalanges of the third digit and the distal half of the third metatarsal. No fracture or dislocation. Degenerative changes of the midfoot. Well corticated small plantar calcaneal spur. There is a small lucency along the distal aspect of the cuboid bone which was not clearly seen previous. This could be postsurgical but is indeterminate. If there is further concern of additional findings  of bone or soft tissue infection additional workup could be performed with MRI or bone scan as clinically appropriate. IMPRESSION: Interval resection of the fifth ray. Soft tissue swelling. There is a subtle lucency along the distal aspect of the cuboid bone is not clearly seen previous but this could be postsurgical or projectional. Additional workup as clinically appropriate. Electronically Signed   By: Karen Kays M.D.   On: 01/24/2024 16:34   DG Chest Port 1 View Result Date: 01/24/2024 CLINICAL DATA:  Sepsis EXAM: PORTABLE CHEST 1 VIEW COMPARISON:  X-ray 07/10/2014. FINDINGS: Under penetrated underinflated radiograph. Slight left basilar atelectasis or scar. No consolidation, pneumothorax or effusion. No edema. Normal cardiopericardial silhouette. Overlapping cardiac leads. IMPRESSION: Limited x-ray. Persistent left basilar scar or atelectasis. No consolidation. Electronically Signed   By: Karen Kays M.D.   On: 01/24/2024 16:05       LOS: 1 day   Osvaldo Shipper  [facility] Pager on [email or web address]  01/25/2024, 10:02 AM

## 2024-01-25 NOTE — Plan of Care (Signed)
 progressing

## 2024-01-25 NOTE — Consult Note (Addendum)
 ORTHOPAEDIC CONSULTATION  REQUESTING PHYSICIAN: Osvaldo Shipper, MD  Chief Complaint: Ulceration lateral right foot.  HPI: Evan Hamilton is a 58 y.o. male who presents with urinary tract infection.  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  Patient is 2 months out from the fifth ray amputation.  Past Medical History:  Diagnosis Date   Allergy    Anxiety and depression    Diabetes mellitus without complication (HCC)    Hx of diabetes - patient has lost weight, no meds, patient states "not a diabetic on 11/22/23"   History of kidney stones    passed stones   Sleep apnea    does not wear CPAP - lost weight   Past Surgical History:  Procedure Laterality Date   AMPUTATION Left 05/22/2021   Procedure: GREAT TOE AMPUTATION;  Surgeon: Kathryne Hitch, MD;  Location: [facility] OR;  Service: Orthopedics;  Laterality: Left;   AMPUTATION Right 05/03/2022   Procedure: RIGHT THIRD TOE AMPUTATION;  Surgeon: Nadara Mustard, MD;  Location: [facility] OR;  Service: Orthopedics;  Laterality: Right;   AMPUTATION Right 11/23/2023   Procedure: RIGHT 5TH RAY AMPUTATION;  Surgeon: Nadara Mustard, MD;  Location: [facility] OR;  Service: Orthopedics;  Laterality: Right;   CHOLECYSTECTOMY N/A 07/22/2014   Procedure: LAPAROSCOPIC CHOLECYSTECTOMY WITH INTRAOPERATIVE CHOLANGIOGRAM;  Surgeon: Harriette Bouillon, MD;  Location: [facility] OR;  Service: General;  Laterality: N/A;   Social History   Socioeconomic History   Marital status: Single    Spouse name: Not on file   Number of children: 0   Years of education: college   Highest education level: Not on file  Occupational History   Not on file  Tobacco Use   Smoking status: Some Days    Current packs/day: 0.50    Average packs/day: 0.5 packs/day for 20.0 years (10.0 ttl pk-yrs)    Types: Cigars, Cigarettes   Smokeless tobacco: Never   Tobacco comments:    Smokes cigars 1 per month  Vaping Use   Vaping status: Never Used  Substance  and Sexual Activity   Alcohol use: Yes    Alcohol/week: 3.0 standard drinks of alcohol    Types: 3 Standard drinks or equivalent per week   Drug use: No   Sexual activity: Yes  Other Topics Concern   Not on file  Social History Narrative   Drinks "some" soda and a "couple" of 5 hour energy drinks a week.    Social Drivers of Corporate investment banker Strain: Not on file  Food Insecurity: No Food Insecurity (01/24/2024)   Hunger Vital Sign    Worried About Running Out of Food in the Last Year: Never true    Ran Out of Food in the Last Year: Never true  Transportation Needs: No Transportation Needs (01/24/2024)   PRAPARE - Administrator, Civil Service (Medical): No    Lack of Transportation (Non-Medical): No  Physical Activity: Not on file  Stress: Not on file  Social Connections: Not on file   Family History  Problem Relation Age of Onset   Cancer Mother        Lung   Diabetes Mother    Heart disease Father    Hyperlipidemia Father    Hypertension Father    Mental illness Sister    Cancer Maternal Grandmother        Lung   Diabetes Maternal Grandmother    - negative except otherwise stated in  the family history section Allergies  Allergen Reactions   Penicillins Hives, Rash and Other (See Comments)    Tolerates Cephalosporins   Prior to Admission medications   Medication Sig Start Date End Date Taking? Authorizing Provider  APPLE CIDER VINEGAR PO Take 2 tablets by mouth daily.   Yes [provider]  Ginkgo Biloba 120 MG CAPS Take 120 mg by mouth in the morning.   Yes [provider]  HYDROcodone-acetaminophen (NORCO/VICODIN) 5-325 MG tablet Take 1 tablet by mouth every 4 (four) hours as needed. Patient taking differently: Take 1 tablet by mouth every 4 (four) hours as needed (for pain). 11/23/23  Yes Nadara Mustard, MD  loratadine (CLARITIN) 10 MG tablet Take 10 mg by mouth every morning.   Yes [provider]  Multiple Vitamin  (MULTIVITAMIN WITH MINERALS) TABS tablet Take 1 tablet by mouth daily with breakfast.   Yes [provider]   MR FOOT RIGHT W WO CONTRAST Result Date: 01/25/2024 CLINICAL DATA:  Slow healing surgical wound after fifth ray amputation. EXAM: MRI OF THE RIGHT FOREFOOT WITHOUT AND WITH CONTRAST TECHNIQUE: Multiplanar, multisequence MR imaging of the right forefoot was performed before and after the administration of intravenous contrast. CONTRAST:  10mL GADAVIST GADOBUTROL 1 MMOL/ML IV SOLN COMPARISON:  Right foot x-rays dated January 24, 2024. FINDINGS: Bones/Joint/Cartilage Prominent marrow edema with corresponding decreased T1 marrow signal and bony erosion involving the cuboid. Associated marrow enhancement. Milder marrow edema with associated enhancement involving the fourth metatarsal base. Prior third and fifth ray amputations. Apparent increased T2 signal and enhancement at the tip of the first distal phalanx is likely artifactual and due to poor fat saturation given no associated marrow signal abnormality STIR sequence. No fracture or dislocation. Talonavicular degenerative changes. No joint effusion. Mild pes planus. Ligaments Lisfranc ligament is intact. Muscles and Tendons No tenosynovitis.  Atrophy of the intrinsic foot muscles. Soft tissue Soft tissue ulceration along the lateral midfoot at the level of the cuboid. Surrounding soft tissue swelling and enhancement. No fluid collection. No soft tissue mass. IMPRESSION: 1. 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 No abscess. 2. Apparent increased T2 signal and enhancement at the tip of the first distal phalanx is likely artifactual and due to poor fat saturation given no associated marrow signal abnormality STIR sequence. Correlate with physical exam to exclude great toe skin breakdown. Electronically Signed   By: Obie Dredge M.D.   On: 01/25/2024 08:48   CT  RENAL STONE STUDY Result Date: 01/24/2024 CLINICAL DATA:  Flu like symptoms. EXAM: CT ABDOMEN AND PELVIS WITHOUT CONTRAST TECHNIQUE: Multidetector CT imaging of the abdomen and pelvis was performed following the standard protocol without IV contrast. RADIATION DOSE REDUCTION: This exam was performed according to the departmental dose-optimization program which includes automated exposure control, adjustment of the mA and/or kV according to patient size and/or use of iterative reconstruction technique. COMPARISON:  November 25, 2016 FINDINGS: Lower chest: Mild atelectatic changes are seen within the posterior aspect of the left lung base peer Hepatobiliary: No focal liver abnormality is seen. Status post cholecystectomy. No biliary dilatation. Pancreas: Unremarkable. No pancreatic ductal dilatation or surrounding inflammatory changes. Spleen: Normal in size without focal abnormality. Adrenals/Urinary Tract: Adrenal glands are unremarkable. Kidneys are normal in size, without renal calculi or hydronephrosis. A 3.6 cm diameter cyst is seen within the lower pole of the left kidney. Bladder is unremarkable. Stomach/Bowel: Stomach is within normal limits.  Appendix appears normal. No evidence of bowel wall thickening, distention, or inflammatory changes. Vascular/Lymphatic: Aortic atherosclerosis. No enlarged abdominal or pelvic lymph nodes. Reproductive: The prostate gland is mildly enlarged. Other: A 4.2 cm x 5.5 cm x 8.8 cm fat containing umbilical hernia is noted. No abdominopelvic ascites. Musculoskeletal: A chronic compression fracture deformity is seen at the level of T12. Multilevel degenerative changes are noted throughout the lumbar spine. IMPRESSION: 1. Evidence of prior cholecystectomy. 2. Large fat-containing umbilical hernia. 3. Chronic compression fracture deformity at the level of T12. 4. Aortic atherosclerosis. Aortic Atherosclerosis (ICD10-I70.0). Electronically Signed   By: Aram Candela M.D.   On:  01/24/2024 21:57   DG Foot 2 Views Right Result Date: 01/24/2024 CLINICAL DATA:  Previous amputations.  Swelling.  Gangrene EXAM: RIGHT FOOT - 2 VIEW COMPARISON:  Foot x-ray 11/16/2023. FINDINGS: Interval amputation of the fifth ray including the phalanges and metatarsal. Mild soft tissue swelling. There has been previous amputation of the phalanges of the third digit and the distal half of the third metatarsal. No fracture or dislocation. Degenerative changes of the midfoot. Well corticated small plantar calcaneal spur. There is a small lucency along the distal aspect of the cuboid bone which was not clearly seen previous. This could be postsurgical but is indeterminate. If there is further concern of additional findings of bone or soft tissue infection additional workup could be performed with MRI or bone scan as clinically appropriate. IMPRESSION: Interval resection of the fifth ray. Soft tissue swelling. There is a subtle lucency along the distal aspect of the cuboid bone is not clearly seen previous but this could be postsurgical or projectional. Additional workup as clinically appropriate. Electronically Signed   By: Karen Kays M.D.   On: 01/24/2024 16:34   DG Chest Port 1 View Result Date: 01/24/2024 CLINICAL DATA:  Sepsis EXAM: PORTABLE CHEST 1 VIEW COMPARISON:  X-ray 07/10/2014. FINDINGS: Under penetrated underinflated radiograph. Slight left basilar atelectasis or scar. No consolidation, pneumothorax or effusion. No edema. Normal cardiopericardial silhouette. Overlapping cardiac leads. IMPRESSION: Limited x-ray. Persistent left basilar scar or atelectasis. No consolidation. Electronically Signed   By: Karen Kays M.D.   On: 01/24/2024 16:05   - pertinent xrays, CT, MRI studies were reviewed and independently interpreted  Positive ROS: All other systems have been reviewed and were otherwise negative with the exception of those mentioned in the HPI and as above.  Physical Exam: General: Alert,  no acute distress Psychiatric: Patient is competent for consent with normal mood and affect Lymphatic: No axillary or cervical lymphadenopathy Cardiovascular: No pedal edema Respiratory: No cyanosis, no use of accessory musculature GI: No organomegaly, abdomen is soft and non-tender    Images:  @ENCIMAGES @  Labs:  Lab Results  Component Value Date   HGBA1C 5.3 05/21/2021   HGBA1C 8.0 (H) 11/26/2016   HGBA1C 7.9 (H) 11/07/2016   ESRSEDRATE 30 (H) 05/22/2021   ESRSEDRATE 33 (H) 05/21/2021   ESRSEDRATE 32 (H) 05/26/2016   CRP 2.3 (H) 05/22/2021   CRP 2.7 (H) 05/21/2021   CRP 26.9 (H) 07/31/2016   LABURIC 7.2 07/31/2016   REPTSTATUS PENDING 01/24/2024   GRAMSTAIN NO WBC SEEN NO ORGANISMS SEEN  05/03/2022   CULT PENDING 01/24/2024    Lab Results  Component Value Date   ALBUMIN 3.9 11/16/2023   ALBUMIN 3.5 05/21/2021   ALBUMIN 3.4 (L) 05/21/2021   LABURIC 7.2 07/31/2016        Latest Ref Rng & Units 01/25/2024    5:38 AM 01/24/2024  10:55 AM 11/16/2023   10:52 PM  CBC EXTENDED  WBC 4.0 - 10.5 K/uL 14.9  20.9  15.3   RBC 4.22 - 5.81 MIL/uL 3.99  4.37  5.06   Hemoglobin 13.0 - 17.0 g/dL 16.1  09.6  04.5   HCT 39.0 - 52.0 % 36.1  39.5  46.4   Platelets 150 - 400 K/uL 107  121  147   NEUT# 1.7 - 7.7 K/uL  18.8  11.0   Lymph# 0.7 - 4.0 K/uL  0.6  2.7     Neurologic: Patient does not have protective sensation bilateral lower extremities.   MUSCULOSKELETAL:   Skin: Examination there is no cellulitis no fluctuance right foot.  Patient has a strong palpable dorsalis pedis pulse.  Patient has a wound over the cuboid that is 2 x 3 cm and 5 mm deep.  Review of the MRI scan shows edema within the cuboid consistent with osteomyelitis.  Assessment: Assessment: Ulceration with osteomyelitis cuboid right foot.  Plan: Discussed with the patient recommendation to proceed with a below-knee amputation.  Patient states he does not want to consider surgical amputation at this  time.  I will follow-up in the office after discharge and discuss treatment options.  Patient was advised that he can proceed with Dial soap cleansing and dry dressing change.  No antibiotics necessary for the right foot.  Thank you for the consult and the opportunity to see Mr. Orland Visconti, MD [facility] [phone] 1:59 PM

## 2024-01-26 DIAGNOSIS — N3000 Acute cystitis without hematuria: Secondary | ICD-10-CM | POA: Diagnosis not present

## 2024-01-26 DIAGNOSIS — N179 Acute kidney failure, unspecified: Secondary | ICD-10-CM | POA: Diagnosis not present

## 2024-01-26 DIAGNOSIS — M86271 Subacute osteomyelitis, right ankle and foot: Secondary | ICD-10-CM | POA: Diagnosis not present

## 2024-01-26 LAB — CBC
HCT: 36.4 % — ABNORMAL LOW (ref 39.0–52.0)
Hemoglobin: 12.3 g/dL — ABNORMAL LOW (ref 13.0–17.0)
MCH: 30.5 pg (ref 26.0–34.0)
MCHC: 33.8 g/dL (ref 30.0–36.0)
MCV: 90.3 fL (ref 80.0–100.0)
Platelets: 112 10*3/uL — ABNORMAL LOW (ref 150–400)
RBC: 4.03 MIL/uL — ABNORMAL LOW (ref 4.22–5.81)
RDW: 13.3 % (ref 11.5–15.5)
WBC: 8.9 10*3/uL (ref 4.0–10.5)
nRBC: 0 % (ref 0.0–0.2)

## 2024-01-26 LAB — BASIC METABOLIC PANEL
Anion gap: 8 (ref 5–15)
BUN: 39 mg/dL — ABNORMAL HIGH (ref 6–20)
CO2: 24 mmol/L (ref 22–32)
Calcium: 8 mg/dL — ABNORMAL LOW (ref 8.9–10.3)
Chloride: 105 mmol/L (ref 98–111)
Creatinine, Ser: 2.35 mg/dL — ABNORMAL HIGH (ref 0.61–1.24)
GFR, Estimated: 32 mL/min — ABNORMAL LOW (ref >60)
Glucose, Bld: 123 mg/dL — ABNORMAL HIGH (ref 70–99)
Potassium: 3.9 mmol/L (ref 3.5–5.1)
Sodium: 137 mmol/L (ref 135–145)

## 2024-01-26 LAB — C-REACTIVE PROTEIN: CRP: 17.6 mg/dL — ABNORMAL HIGH (ref <1.0)

## 2024-01-26 LAB — SEDIMENTATION RATE: Sed Rate: 90 mm/h — ABNORMAL HIGH (ref 0–16)

## 2024-01-26 MED ORDER — SODIUM CHLORIDE 0.9 % IV BOLUS
500.0000 mL | Freq: Once | INTRAVENOUS | Status: AC
  Administered 2024-01-26: 500 mL via INTRAVENOUS

## 2024-01-26 MED ORDER — SODIUM CHLORIDE 0.9 % IV SOLN: INTRAVENOUS | Status: AC

## 2024-01-26 NOTE — Plan of Care (Signed)

## 2024-01-26 NOTE — Progress Notes (Signed)
 [facility] PROGRESS NOTE   Evan Hamilton EAV:[id number] DOB: 31-Aug-1966 DOA: 01/24/2024  PCP: Patient, No Pcp Per  Brief History: 58 y.o. 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 Complicated by wound dehisences . Sees ortho regularly.  Came in with complaints of difficulty urinating.  Developed fever.  Noted to have abnormal UA in the ER.  Was hospitalized for further management.   Consultants: Orthopedics  Procedures: None yet    Subjective/Interval History: Patient with no significant complaints this morning.  No pain in the right foot.  Has been urinating.  Frustrated that his creatinine has not improved.  He was told that his WBC was back to normal.     Assessment/Plan:  Acute urinary tract infection with sepsis, present on admission Cultures without any growth.  However UA was noted to be abnormal.  And he did have symptoms suggesting UTI. Continue ceftriaxone for now.  Anticipate change to oral antibiotics tomorrow if kidney function has improved. WBC has improved to normal today.  He is afebrile.  Lactic acid level was normal.  Acute kidney injury/hyponatremia Baseline creatinine is normal.   Presented with creatinine of 1.92.  It worsened to 2.31 today and noted to be 2.35 this morning.  Has had decent urine output. CT renal stone showed he did not show any hydronephrosis or nephrolithiasis or abscess. Continue with IV fluids.  Hopefully his renal function has plateaued and will start improving gradually.    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 Underwent MRI which showed abnormal findings raising concern for osteomyelitis. ESR 90.  CRP 17.6. Seen by Dr. Lajoyce Corners yesterday who recommended below-knee amputation.  Patient wants to think about it.  Decisions to be made in the outpatient setting.  No  antibiotics recommended by orthopedics. Seen by wound care as well.  Normocytic anemia/thrombocytopenia. No evidence for overt bleeding.  Monitor closely.  Hemoglobin is stable.  Obesity Estimated body mass index is 44.18 kg/m as calculated from the following:   Height as of this encounter: 6\' 2"  (1.88 m).   Weight as of this encounter: 156.1 kg.  DVT Prophylaxis: Lovenox Code Status: Full code Family Communication: Discussed with the patient Disposition Plan: Hopefully return home when improved once renal function starts improving.  Status is: Inpatient Remains inpatient appropriate because: Need for IV antibiotics.  AKI      Medications: Scheduled:  enoxaparin (LOVENOX) injection  80 mg Subcutaneous Q24H   sodium chloride flush  3 mL Intravenous Q12H   Continuous:  sodium chloride 100 mL/hr at 01/26/24 0946   cefTRIAXone (ROCEPHIN)  IV Stopped (01/25/24 1307)   NWG:[id number] **OR** acetaminophen, polyethylene glycol  Antibiotics: Anti-infectives (From admission, onward)    Start     Dose/Rate Route Frequency Ordered Stop   01/24/24 1300  vancomycin (VANCOCIN) IVPB 1000 mg/200 mL premix  Status:  Discontinued        1,000 mg 200 mL/hr over 60 Minutes Intravenous  Once 01/24/24 1256 01/24/24 1259   01/24/24 1300  cefTRIAXone (ROCEPHIN) 2 g in sodium chloride 0.9 % 100 mL IVPB        2 g 200 mL/hr over 30 Minutes Intravenous Every 24 hours 01/24/24 1256     01/24/24 1300  vancomycin (VANCOREADY) IVPB 2000 mg/400 mL        2,000 mg 200 mL/hr over 120 Minutes Intravenous  Once 01/24/24 1259 01/24/24 1526       Objective:  Vital Signs  Vitals:   01/25/24 1222 01/25/24 2045 01/26/24 0456 01/26/24 0808  BP: 129/68 103/61 128/69 116/71  Pulse: 95 89 84 74  Resp: 13 18 20 20   Temp: 98.1 F (36.7 C) 99.2 F (37.3 C) 99 F (37.2 C) 98.8 F (37.1 C)  TempSrc: Oral Oral Oral Oral  SpO2: 95% 93% 95% 97%  Weight:      Height:        Intake/Output Summary  (Last 24 hours) at 01/26/2024 0959 Last data filed at 01/25/2024 2058 Gross per 24 hour  Intake 1310.63 ml  Output --  Net 1310.63 ml   Filed Weights   01/24/24 1009 01/24/24 1016 01/24/24 2138  Weight: (!) 154.2 kg (!) 154 kg (!) 156.1 kg    General appearance: Awake alert.  In no distress Resp: Clear to auscultation bilaterally.  Normal effort Cardio: S1-S2 is normal regular.  No S3-S4.  No rubs murmurs or bruit GI: Abdomen is soft.  Nontender nondistended.  Bowel sounds are present normal.  No masses organomegaly Extremities: Right foot covered in dressing  Lab Results:  Data Reviewed: I have personally reviewed following labs and reports of the imaging studies  CBC: Recent Labs  Lab 01/24/24 1055 01/25/24 0538 01/26/24 0550  WBC 20.9* 14.9* 8.9  NEUTROABS 18.8*  --   --   HGB 13.5 12.5* 12.3*  HCT 39.5 36.1* 36.4*  MCV 90.4 90.5 90.3  PLT 121* 107* 112*    Basic Metabolic Panel: Recent Labs  Lab 01/24/24 1055 01/25/24 0538 01/26/24 0550  NA 132* 133* 137  K 3.3* 3.8 3.9  CL 100 104 105  CO2 21* 21* 24  GLUCOSE 151* 103* 123*  BUN 36* 41* 39*  CREATININE 1.92* 2.31* 2.35*  CALCIUM 8.5* 7.6* 8.0*    GFR: Estimated Creatinine Clearance: 55.5 mL/min (A) (by C-G formula based on SCr of 2.35 mg/dL (H)).   Coagulation Profile: Recent Labs  Lab 01/24/24 1258 01/25/24 0538  INR 1.2 1.2     Recent Results (from the past 240 hours)  Resp panel by RT-PCR (RSV, Flu A&B, Covid) Anterior Nasal Swab     Status: None   Collection Time: 01/24/24 10:35 AM   Specimen: Anterior Nasal Swab  Result Value Ref Range Status   SARS Coronavirus 2 by RT PCR NEGATIVE NEGATIVE Final    Comment: (NOTE) SARS-CoV-2 target nucleic acids are NOT DETECTED.  The SARS-CoV-2 RNA is generally detectable in upper respiratory specimens during the acute phase of infection. The lowest concentration of SARS-CoV-2 viral copies this assay can detect is 138 copies/mL. A negative result  does not preclude SARS-Cov-2 infection and should not be used as the sole basis for treatment or other patient management decisions. A negative result may occur with  improper specimen collection/handling, submission of specimen other than nasopharyngeal swab, presence of viral mutation(s) within the areas targeted by this assay, and inadequate number of viral copies(<138 copies/mL). A negative result must be combined with clinical observations, patient history, and epidemiological information. The expected result is Negative.  Fact Sheet for Patients:  BloggerCourse.com  Fact Sheet for Healthcare Providers:  SeriousBroker.it  This test is no t yet approved or cleared by the [place] FDA and  has been authorized for detection and/or diagnosis of SARS-CoV-2 by FDA under an Emergency Use Authorization (EUA). This EUA will remain  in effect (meaning this test can be used) for the  duration of the COVID-19 declaration under Section 564(b)(1) of the Act, 21 U.S.C.section 360bbb-3(b)(1), unless the authorization is terminated  or revoked sooner.       Influenza A by PCR NEGATIVE NEGATIVE Final   Influenza B by PCR NEGATIVE NEGATIVE Final    Comment: (NOTE) The Xpert Xpress SARS-CoV-2/FLU/RSV plus assay is intended as an aid in the diagnosis of influenza from Nasopharyngeal swab specimens and should not be used as a sole basis for treatment. Nasal washings and aspirates are unacceptable for Xpert Xpress SARS-CoV-2/FLU/RSV testing.  Fact Sheet for Patients: BloggerCourse.com  Fact Sheet for Healthcare Providers: SeriousBroker.it  This test is not yet approved or cleared by the [place] FDA and has been authorized for detection and/or diagnosis of SARS-CoV-2 by FDA under an Emergency Use Authorization (EUA). This EUA will remain in effect (meaning this test can be used) for  the duration of the COVID-19 declaration under Section 564(b)(1) of the Act, 21 U.S.C. section 360bbb-3(b)(1), unless the authorization is terminated or revoked.     Resp Syncytial Virus by PCR NEGATIVE NEGATIVE Final    Comment: (NOTE) Fact Sheet for Patients: BloggerCourse.com  Fact Sheet for Healthcare Providers: SeriousBroker.it  This test is not yet approved or cleared by the [place] FDA and has been authorized for detection and/or diagnosis of SARS-CoV-2 by FDA under an Emergency Use Authorization (EUA). This EUA will remain in effect (meaning this test can be used) for the duration of the COVID-19 declaration under Section 564(b)(1) of the Act, 21 U.S.C. section 360bbb-3(b)(1), unless the authorization is terminated or revoked.  Performed at [facility], [address] [place], [place] [address]   Blood Culture (routine x 2)     Status: None (Preliminary result)   Collection Time: 01/24/24 12:58 PM   Specimen: BLOOD LEFT ARM  Result Value Ref Range Status   Specimen Description   Final    BLOOD LEFT ARM Performed at [facility], [address], [place], [place] [address]    Special Requests   Final    BOTTLES DRAWN AEROBIC AND ANAEROBIC Blood Culture adequate volume Performed at [facility], [address], [place], [place] [address]    Culture   Final    NO GROWTH < 24 HOURS Performed at [facility], [address], [place], [place] [address]    Report Status PENDING  Incomplete  Blood Culture (routine x 2)     Status: None (Preliminary result)   Collection Time: 01/24/24 12:58 PM   Specimen: BLOOD RIGHT ARM  Result Value Ref Range Status   Specimen Description   Final    BLOOD RIGHT ARM Performed at [facility], [address] [place], [place] [address]    Special Requests   Final    BOTTLES DRAWN AEROBIC AND ANAEROBIC Blood Culture adequate  volume Performed at [facility], [address] [place], [place] [address]    Culture   Final    NO GROWTH < 24 HOURS Performed at [facility], [address] [place], [place] [address]    Report Status PENDING  Incomplete  Urine Culture     Status: Abnormal   Collection Time: 01/24/24  3:24 PM   Specimen: Urine, Random  Result Value Ref Range Status   Specimen Description   Final    URINE, RANDOM Performed at [facility], [address] [place], [place] [address]    Special  Requests URINE, CLEAN CATCH  Final   Culture (A)  Final    <10,000 COLONIES/mL INSIGNIFICANT GROWTH Performed at [facility], [address] [place], [place] [address]    Report Status 01/25/2024 FINAL  Final      Radiology Studies: MR FOOT RIGHT W WO CONTRAST Result Date: 01/25/2024 CLINICAL DATA:  Slow healing surgical wound after fifth ray amputation. EXAM: MRI OF THE RIGHT FOREFOOT WITHOUT AND WITH CONTRAST TECHNIQUE: Multiplanar, multisequence MR imaging of the right forefoot was performed before and after the administration of intravenous contrast. CONTRAST:  10mL GADAVIST GADOBUTROL 1 MMOL/ML IV SOLN COMPARISON:  Right foot x-rays dated January 24, 2024. FINDINGS: Bones/Joint/Cartilage Prominent marrow edema with corresponding decreased T1 marrow signal and bony erosion involving the cuboid. Associated marrow enhancement. Milder marrow edema with associated enhancement involving the fourth metatarsal base. Prior third and fifth ray amputations. Apparent increased T2 signal and enhancement at the tip of the first distal phalanx is likely artifactual and due to poor fat saturation given no associated marrow signal abnormality STIR sequence. No fracture or dislocation. Talonavicular degenerative changes. No joint effusion. Mild pes planus. Ligaments Lisfranc ligament is intact. Muscles and Tendons No tenosynovitis.  Atrophy of the intrinsic foot muscles. Soft  tissue Soft tissue ulceration along the lateral midfoot at the level of the cuboid. Surrounding soft tissue swelling and enhancement. No fluid collection. No soft tissue mass. IMPRESSION: 1. 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 No abscess. 2. Apparent increased T2 signal and enhancement at the tip of the first distal phalanx is likely artifactual and due to poor fat saturation given no associated marrow signal abnormality STIR sequence. Correlate with physical exam to exclude great toe skin breakdown. Electronically Signed   By: Obie Dredge M.D.   On: 01/25/2024 08:48   CT RENAL STONE STUDY Result Date: 01/24/2024 CLINICAL DATA:  Flu like symptoms. EXAM: CT ABDOMEN AND PELVIS WITHOUT CONTRAST TECHNIQUE: Multidetector CT imaging of the abdomen and pelvis was performed following the standard protocol without IV contrast. RADIATION DOSE REDUCTION: This exam was performed according to the departmental dose-optimization program which includes automated exposure control, adjustment of the mA and/or kV according to patient size and/or use of iterative reconstruction technique. COMPARISON:  November 25, 2016 FINDINGS: Lower chest: Mild atelectatic changes are seen within the posterior aspect of the left lung base peer Hepatobiliary: No focal liver abnormality is seen. Status post cholecystectomy. No biliary dilatation. Pancreas: Unremarkable. No pancreatic ductal dilatation or surrounding inflammatory changes. Spleen: Normal in size without focal abnormality. Adrenals/Urinary Tract: Adrenal glands are unremarkable. Kidneys are normal in size, without renal calculi or hydronephrosis. A 3.6 cm diameter cyst is seen within the lower pole of the left kidney. Bladder is unremarkable. Stomach/Bowel: Stomach is within normal limits. Appendix appears normal. No evidence of bowel wall thickening, distention, or inflammatory changes.  Vascular/Lymphatic: Aortic atherosclerosis. No enlarged abdominal or pelvic lymph nodes. Reproductive: The prostate gland is mildly enlarged. Other: A 4.2 cm x 5.5 cm x 8.8 cm fat containing umbilical hernia is noted. No abdominopelvic ascites. Musculoskeletal: A chronic compression fracture deformity is seen at the level of T12. Multilevel degenerative changes are noted throughout the lumbar spine. IMPRESSION: 1. Evidence of prior cholecystectomy. 2. Large fat-containing umbilical hernia. 3. Chronic compression fracture deformity at the level of T12. 4. Aortic atherosclerosis. Aortic Atherosclerosis (ICD10-I70.0). Electronically Signed   By: Aram Candela M.D.   On: 01/24/2024  21:57   DG Foot 2 Views Right Result Date: 01/24/2024 CLINICAL DATA:  Previous amputations.  Swelling.  Gangrene EXAM: RIGHT FOOT - 2 VIEW COMPARISON:  Foot x-ray 11/16/2023. FINDINGS: Interval amputation of the fifth ray including the phalanges and metatarsal. Mild soft tissue swelling. There has been previous amputation of the phalanges of the third digit and the distal half of the third metatarsal. No fracture or dislocation. Degenerative changes of the midfoot. Well corticated small plantar calcaneal spur. There is a small lucency along the distal aspect of the cuboid bone which was not clearly seen previous. This could be postsurgical but is indeterminate. If there is further concern of additional findings of bone or soft tissue infection additional workup could be performed with MRI or bone scan as clinically appropriate. IMPRESSION: Interval resection of the fifth ray. Soft tissue swelling. There is a subtle lucency along the distal aspect of the cuboid bone is not clearly seen previous but this could be postsurgical or projectional. Additional workup as clinically appropriate. Electronically Signed   By: Karen Kays M.D.   On: 01/24/2024 16:34   DG Chest Port 1 View Result Date: 01/24/2024 CLINICAL DATA:  Sepsis EXAM:  PORTABLE CHEST 1 VIEW COMPARISON:  X-ray 07/10/2014. FINDINGS: Under penetrated underinflated radiograph. Slight left basilar atelectasis or scar. No consolidation, pneumothorax or effusion. No edema. Normal cardiopericardial silhouette. Overlapping cardiac leads. IMPRESSION: Limited x-ray. Persistent left basilar scar or atelectasis. No consolidation. Electronically Signed   By: Karen Kays M.D.   On: 01/24/2024 16:05       LOS: 2 days   Frederic Tones Rito Ehrlich  [facility] Pager on [email or web address]  01/26/2024, 9:59 AM

## 2024-01-26 NOTE — Plan of Care (Signed)
  Problem: Nutrition: Goal: Adequate nutrition will be maintained Outcome: Progressing   Problem: Coping: Goal: Level of anxiety will decrease Outcome: Progressing   Problem: Elimination: Goal: Will not experience complications related to bowel motility Outcome: Progressing   Problem: Pain Managment: Goal: General experience of comfort will improve and/or be controlled Outcome: Progressing   Problem: Safety: Goal: Ability to remain free from injury will improve Outcome: Progressing   Problem: Skin Integrity: Goal: Risk for impaired skin integrity will decrease Outcome: Progressing

## 2024-01-27 ENCOUNTER — Inpatient Hospital Stay (HOSPITAL_COMMUNITY)

## 2024-01-27 ENCOUNTER — Encounter (HOSPITAL_COMMUNITY): Payer: Self-pay | Admitting: Internal Medicine

## 2024-01-27 DIAGNOSIS — A419 Sepsis, unspecified organism: Secondary | ICD-10-CM | POA: Diagnosis not present

## 2024-01-27 LAB — BASIC METABOLIC PANEL
Anion gap: 11 (ref 5–15)
BUN: 33 mg/dL — ABNORMAL HIGH (ref 6–20)
CO2: 17 mmol/L — ABNORMAL LOW (ref 22–32)
Calcium: 8 mg/dL — ABNORMAL LOW (ref 8.9–10.3)
Chloride: 111 mmol/L (ref 98–111)
Creatinine, Ser: 2.28 mg/dL — ABNORMAL HIGH (ref 0.61–1.24)
GFR, Estimated: 33 mL/min — ABNORMAL LOW (ref >60)
Glucose, Bld: 125 mg/dL — ABNORMAL HIGH (ref 70–99)
Potassium: 3.7 mmol/L (ref 3.5–5.1)
Sodium: 139 mmol/L (ref 135–145)

## 2024-01-27 LAB — CBC
HCT: 34.3 % — ABNORMAL LOW (ref 39.0–52.0)
Hemoglobin: 11.7 g/dL — ABNORMAL LOW (ref 13.0–17.0)
MCH: 30.5 pg (ref 26.0–34.0)
MCHC: 34.1 g/dL (ref 30.0–36.0)
MCV: 89.6 fL (ref 80.0–100.0)
Platelets: 104 10*3/uL — ABNORMAL LOW (ref 150–400)
RBC: 3.83 MIL/uL — ABNORMAL LOW (ref 4.22–5.81)
RDW: 13.5 % (ref 11.5–15.5)
WBC: 6.7 10*3/uL (ref 4.0–10.5)
nRBC: 0 % (ref 0.0–0.2)

## 2024-01-27 LAB — BRAIN NATRIURETIC PEPTIDE: B Natriuretic Peptide: 140.9 pg/mL — ABNORMAL HIGH (ref 0.0–100.0)

## 2024-01-27 MED ORDER — SODIUM CHLORIDE 0.9 % IV BOLUS
2500.0000 mL | Freq: Once | INTRAVENOUS | Status: AC
  Administered 2024-01-28: 2500 mL via INTRAVENOUS

## 2024-01-27 NOTE — Progress Notes (Signed)
 OT Cancellation Note  Patient Details Name: Bane Hagy MRN: [id number] DOB: 07/21/1966   Cancelled Treatment:    Reason Eval/Treat Not Completed: (P) OT screened, no needs identified, will sign off, no OT needs, independent with mobility/ADLs. Signing off.  Alexis Goodell 01/27/2024, 4:36 PM

## 2024-01-27 NOTE — Progress Notes (Signed)
 PT Cancellation Note  Patient Details Name: Evan Hamilton MRN: [id number] DOB: 1966-01-30   Cancelled Treatment:    Reason Eval/Treat Not Completed: Patient at procedure or test/unavailable (Pt in imaging. Not available at this time. Will continue to follow up as able and appropriate.)  Harrel Carina, DPT, CLT  Acute Rehabilitation Services Office: [phone] (Secure chat preferred)   Claudia Desanctis 01/27/2024, 4:01 PM

## 2024-01-27 NOTE — Progress Notes (Signed)
 [facility] PROGRESS NOTE   Evan Hamilton MVH:[id number] DOB: 1966-09-09 DOA: 01/24/2024  PCP: Patient, No Pcp Per  Brief History: 58 y.o. 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 Complicated by wound dehisences . Sees ortho regularly.  Came in with complaints of difficulty urinating.  Developed fever.  Noted to have abnormal UA in the ER.  Was hospitalized for further management.   Consultants: Orthopedics, nephrology  Procedures: None yet    Subjective/Interval History: Patient in bed, appears comfortable, denies any headache, no fever, no chest pain or pressure, no shortness of breath , no abdominal pain. No focal weakness.   Assessment/Plan:  Acute urinary tract infection with sepsis, present on admission along with AKI. Cultures without any growth.  However UA was noted to be abnormal.  And he did have symptoms suggesting UTI. Continue ceftriaxone for now.  Anticipate change to oral antibiotics tomorrow if kidney function has improved. Sepsis pathophysiology has resolved, follow final culture results, has been adequately hydrated but creatinine continues to stay elevated, question if he has BPH with incomplete emptying, obtain renal ultrasound, renal electrolytes and renal nephrology consult.  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 Underwent MRI which showed abnormal findings raising concern for osteomyelitis. ESR 90.  CRP 17.6. Seen by Dr. Lajoyce Corners yesterday who recommended below-knee amputation.  Patient wants to think about it.  Decisions to be made in the outpatient setting.  No antibiotics recommended by orthopedics. Seen by wound care as well.  Normocytic anemia/thrombocytopenia. No evidence for overt bleeding.  Monitor closely.  Hemoglobin is stable.  Obesity Estimated body mass index is 44.18 kg/m  as calculated from the following:   Height as of this encounter: 6\' 2"  (1.88 m).   Weight as of this encounter: 156.1 kg.  DVT Prophylaxis: Lovenox Code Status: Full code Family Communication: Discussed with the patient Disposition Plan: Hopefully return home when improved once renal function starts improving.  Status is: Inpatient Remains inpatient appropriate because: Need for IV antibiotics.  AKI      Medications: Scheduled:  enoxaparin (LOVENOX) injection  80 mg Subcutaneous Q24H   sodium chloride flush  3 mL Intravenous Q12H   Continuous:  cefTRIAXone (ROCEPHIN)  IV Stopped (01/26/24 1255)   WUX:[id number] **OR** acetaminophen, polyethylene glycol  Antibiotics: Anti-infectives (From admission, onward)    Start     Dose/Rate Route Frequency Ordered Stop   01/24/24 1300  vancomycin (VANCOCIN) IVPB 1000 mg/200 mL premix  Status:  Discontinued        1,000 mg 200 mL/hr over 60 Minutes Intravenous  Once 01/24/24 1256 01/24/24 1259   01/24/24 1300  cefTRIAXone (ROCEPHIN) 2 g in sodium chloride 0.9 % 100 mL IVPB        2 g 200 mL/hr over 30 Minutes Intravenous Every 24 hours 01/24/24 1256     01/24/24 1300  vancomycin (VANCOREADY) IVPB 2000 mg/400 mL        2,000 mg 200 mL/hr over 120 Minutes Intravenous  Once 01/24/24 1259 01/24/24 1526       Objective:  Vital Signs  Vitals:   01/26/24 0808 01/26/24 1917 01/26/24 2320 01/27/24 0420  BP: 116/71 128/67 128/72 (!) 113/43  Pulse: 74 83 77 81  Resp: 20 18 18 18   Temp: 98.8 F (37.1 C) 98.6 F (37 C) 97.9 F (36.6 C) 98.2 F (36.8 C)  TempSrc: Oral Oral Oral Oral  SpO2: 97% 96% 97% 95%  Weight:      Height:        Intake/Output Summary (Last 24 hours) at 01/27/2024 1058 Last data filed at 01/27/2024 0515 Gross per 24 hour  Intake 2668 ml  Output 1785 ml  Net 883 ml   Filed Weights   01/24/24 1009 01/24/24 1016 01/24/24 2138  Weight: (!) 154.2 kg (!) 154 kg (!) 156.1 kg    Exam  Awake Alert, No new  F.N deficits, Normal affect [place].AT,PERRAL Supple Neck, No JVD,   Symmetrical Chest wall movement, Good air movement bilaterally, CTAB RRR,No Gallops, Rubs or new Murmurs,  +ve B.Sounds, Abd Soft, No tenderness,   No Cyanosis, Clubbing or edema    Lab Results:  Data Reviewed: I have personally reviewed following labs and reports of the imaging studies  CBC: Recent Labs  Lab 01/24/24 1055 01/25/24 0538 01/26/24 0550 01/27/24 0547  WBC 20.9* 14.9* 8.9 6.7  NEUTROABS 18.8*  --   --   --   HGB 13.5 12.5* 12.3* 11.7*  HCT 39.5 36.1* 36.4* 34.3*  MCV 90.4 90.5 90.3 89.6  PLT 121* 107* 112* 104*    Basic Metabolic Panel: Recent Labs  Lab 01/24/24 1055 01/25/24 0538 01/26/24 0550 01/27/24 0547  NA 132* 133* 137 139  K 3.3* 3.8 3.9 3.7  CL 100 104 105 111  CO2 21* 21* 24 17*  GLUCOSE 151* 103* 123* 125*  BUN 36* 41* 39* 33*  CREATININE 1.92* 2.31* 2.35* 2.28*  CALCIUM 8.5* 7.6* 8.0* 8.0*    GFR: Estimated Creatinine Clearance: 57.2 mL/min (A) (by C-G formula based on SCr of 2.28 mg/dL (H)).   Coagulation Profile: Recent Labs  Lab 01/24/24 1258 01/25/24 0538  INR 1.2 1.2     Recent Results (from the past 240 hours)  Resp panel by RT-PCR (RSV, Flu A&B, Covid) Anterior Nasal Swab     Status: None   Collection Time: 01/24/24 10:35 AM   Specimen: Anterior Nasal Swab  Result Value Ref Range Status   SARS Coronavirus 2 by RT PCR NEGATIVE NEGATIVE Final    Comment: (NOTE) SARS-CoV-2 target nucleic acids are NOT DETECTED.  The SARS-CoV-2 RNA is generally detectable in upper respiratory specimens during the acute phase of infection. The lowest concentration of SARS-CoV-2 viral copies this assay can detect is 138 copies/mL. A negative result does not preclude SARS-Cov-2 infection and should not be used as the sole basis for treatment or other patient management decisions. A negative result may occur with  improper specimen collection/handling, submission of specimen  other than nasopharyngeal swab, presence of viral mutation(s) within the areas targeted by this assay, and inadequate number of viral copies(<138 copies/mL). A negative result must be combined with clinical observations, patient history, and epidemiological information. The expected result is Negative.  Fact Sheet for Patients:  BloggerCourse.com  Fact Sheet for Healthcare Providers:  SeriousBroker.it  This test is no t yet approved or cleared by the [place] FDA and  has been authorized for detection and/or diagnosis of SARS-CoV-2 by FDA under an Emergency Use Authorization (EUA). This EUA will remain  in effect (meaning this test can be used) for the duration of the COVID-19 declaration under Section 564(b)(1) of the Act, 21 U.S.C.section 360bbb-3(b)(1), unless the authorization is terminated  or revoked sooner.       Influenza A by PCR NEGATIVE NEGATIVE Final   Influenza B by PCR NEGATIVE NEGATIVE Final  Comment: (NOTE) The Xpert Xpress SARS-CoV-2/FLU/RSV plus assay is intended as an aid in the diagnosis of influenza from Nasopharyngeal swab specimens and should not be used as a sole basis for treatment. Nasal washings and aspirates are unacceptable for Xpert Xpress SARS-CoV-2/FLU/RSV testing.  Fact Sheet for Patients: BloggerCourse.com  Fact Sheet for Healthcare Providers: SeriousBroker.it  This test is not yet approved or cleared by the [place] FDA and has been authorized for detection and/or diagnosis of SARS-CoV-2 by FDA under an Emergency Use Authorization (EUA). This EUA will remain in effect (meaning this test can be used) for the duration of the COVID-19 declaration under Section 564(b)(1) of the Act, 21 U.S.C. section 360bbb-3(b)(1), unless the authorization is terminated or revoked.     Resp Syncytial Virus by PCR NEGATIVE NEGATIVE Final     Comment: (NOTE) Fact Sheet for Patients: BloggerCourse.com  Fact Sheet for Healthcare Providers: SeriousBroker.it  This test is not yet approved or cleared by the [place] FDA and has been authorized for detection and/or diagnosis of SARS-CoV-2 by FDA under an Emergency Use Authorization (EUA). This EUA will remain in effect (meaning this test can be used) for the duration of the COVID-19 declaration under Section 564(b)(1) of the Act, 21 U.S.C. section 360bbb-3(b)(1), unless the authorization is terminated or revoked.  Performed at [facility], [address] [place], [place] [address]   Blood Culture (routine x 2)     Status: None (Preliminary result)   Collection Time: 01/24/24 12:58 PM   Specimen: BLOOD LEFT ARM  Result Value Ref Range Status   Specimen Description   Final    BLOOD LEFT ARM Performed at [facility], [address], [place], [place] [address]    Special Requests   Final    BOTTLES DRAWN AEROBIC AND ANAEROBIC Blood Culture adequate volume Performed at [facility], [address] [place], [place] [address]    Culture   Final    NO GROWTH 3 DAYS Performed at [facility], [address] [place], [place] [address]    Report Status PENDING  Incomplete  Blood Culture (routine x 2)     Status: None (Preliminary result)   Collection Time: 01/24/24 12:58 PM   Specimen: BLOOD RIGHT ARM  Result Value Ref Range Status   Specimen Description   Final    BLOOD RIGHT ARM Performed at [facility], [address], [place], [place] [address]    Special Requests   Final    BOTTLES DRAWN AEROBIC AND ANAEROBIC Blood Culture adequate volume Performed at [facility], [address], [place], [place] [address]    Culture   Final    NO GROWTH 3 DAYS Performed at [facility], [address], [place], [place] [address]    Report  Status PENDING  Incomplete  Urine Culture     Status: Abnormal   Collection Time: 01/24/24  3:24 PM   Specimen: Urine, Random  Result Value Ref Range Status   Specimen Description   Final    URINE, RANDOM Performed at [facility], [address], [place], [place] [address]    Special Requests URINE, CLEAN CATCH  Final   Culture (A)  Final    <10,000 COLONIES/mL INSIGNIFICANT GROWTH Performed at [facility], [address] [place], [place] [address]    Report Status 01/25/2024 FINAL  Final      Radiology Studies: No results  found.   LOS: 3 days   Signature  -    Susa Raring M.D on 01/27/2024 at 10:59 AM   -  To page go to [email or web address]

## 2024-01-27 NOTE — Consult Note (Signed)
 Renal Service Consult Note [facility]  Evan Hamilton 01/27/2024 Maree Krabbe, MD Requesting Physician: Dr. Thedore Mins  Reason for Consult: Renal failure HPI: The patient is a 58 y.o. year-old w/ PMH as below who presented to ED on 3/06 complaining of flulike symptoms for a few days.  Also he had recently had significant infection of his right foot and had amputation of the right fifth toe in January 2025.  He stated reportedly that the day before presenting he had difficulty urinating having to strain.  Then in the afternoon he had generalized bodyaches with tremors and rigors.  Patient's urine had been very dark-colored.  In the ED patient's temperature was 101, blood pressure 70/48.  Patient was given IV fluids, IV vancomycin and Rocephin and then started to feel a "lot better".  Imaging showed a lucency in the cuboid bone and the case was discussed with the orthopedic service and they requested that he be transferred to [facility].  The patient was admitted for sepsis possibly due to UTI or related to foot infection.  Creatinine was 1.9 on admission, and then bumped up to 2.2- 2.3. We are asked to see for renal failure.   BP's here have been 103/61 up to 131/65.  No blood pressures under 100 were noted.  Patient has been on room air since admission 3 days ago with normal O2 sats.  Urine output was not recorded for the first 2 days but he had 860 out yesterday and 2175 out today.  White blood count has improved from 14 down to 6 and temperatures are resolving from 101 down to 98.9.  Blood cultures were negative, urine culture showed less than 10,000 colonies.  Renal ultrasound on 3/09 showed 15-16 cm kidneys without hydronephrosis.  The CT renal stone study done on 3/06 also showed showed normal-appearing kidneys bilaterally without hydronephrosis.  The urinalysis done on 3/6 showed large hemoglobin, moderate leukocytes, 100 protein, greater than 50 RBCs and greater than 50 WBCs with rare  bacteria.  Chest x-ray on 3/06 was a limited x-ray with persistent mild left basilar scarring versus atelectasis, no consolidation no effusion no edema.  Normal cardiopericardial silhouette. I/O's are 6.1 L in and 3.0 L out = +3.0 L.  Meds used here - lovenox, Kdur and normal saline, IV Rocephin, LR infusion, 2 L normal saline bolus, IV vancomycin 2 g once on 3/06.  MRI contrast Gadavist on 3/06.  Tylenol as needed.  Pt seen in room.  Patient could not remember what day he came to the hospital I told him it was last Thursday, March 6.  He told me about his rigors and difficulty urinating and urinary frequency prior to admission.  He was having lots of sweats and was trying to keep out by drinking lots of water.  Overall he is feeling much better and voiding properly.  PMH Anxiety and depression Diabetes mellitus History of kidney stones Sleep apnea   ROS - denies CP, no joint pain, no HA, no blurry vision, no rash, no diarrhea, no nausea/ vomiting, no dysuria, no difficulty voiding   Past Medical History  Past Medical History:  Diagnosis Date   Allergy    Anxiety and depression    Diabetes mellitus without complication (HCC)    Hx of diabetes - patient has lost weight, no meds, patient states "not a diabetic on 11/22/23"   History of kidney stones    passed stones   Sleep apnea    does not wear CPAP -  lost weight   Past Surgical History  Past Surgical History:  Procedure Laterality Date   AMPUTATION Left 05/22/2021   Procedure: GREAT TOE AMPUTATION;  Surgeon: Kathryne Hitch, MD;  Location: [facility] OR;  Service: Orthopedics;  Laterality: Left;   AMPUTATION Right 05/03/2022   Procedure: RIGHT THIRD TOE AMPUTATION;  Surgeon: Nadara Mustard, MD;  Location: [facility] OR;  Service: Orthopedics;  Laterality: Right;   AMPUTATION Right 11/23/2023   Procedure: RIGHT 5TH RAY AMPUTATION;  Surgeon: Nadara Mustard, MD;  Location: [facility] OR;  Service: Orthopedics;  Laterality: Right;   CHOLECYSTECTOMY N/A  07/22/2014   Procedure: LAPAROSCOPIC CHOLECYSTECTOMY WITH INTRAOPERATIVE CHOLANGIOGRAM;  Surgeon: Harriette Bouillon, MD;  Location: [facility] OR;  Service: General;  Laterality: N/A;   Family History  Family History  Problem Relation Age of Onset   Cancer Mother        Lung   Diabetes Mother    Heart disease Father    Hyperlipidemia Father    Hypertension Father    Mental illness Sister    Cancer Maternal Grandmother        Lung   Diabetes Maternal Grandmother    Social History  reports that he has been smoking cigars and cigarettes. He has a 10 pack-year smoking history. He has never used smokeless tobacco. He reports current alcohol use of about 3.0 standard drinks of alcohol per week. He reports that he does not use drugs. Allergies  Allergies  Allergen Reactions   Penicillins Hives, Rash and Other (See Comments)    Tolerates Cephalosporins   Home medications Prior to Admission medications   Medication Sig Start Date End Date Taking? Authorizing Provider  APPLE CIDER VINEGAR PO Take 2 tablets by mouth daily.   Yes [provider]  Ginkgo Biloba 120 MG CAPS Take 120 mg by mouth in the morning.   Yes [provider]  HYDROcodone-acetaminophen (NORCO/VICODIN) 5-325 MG tablet Take 1 tablet by mouth every 4 (four) hours as needed. Patient taking differently: Take 1 tablet by mouth every 4 (four) hours as needed (for pain). 11/23/23  Yes Nadara Mustard, MD  loratadine (CLARITIN) 10 MG tablet Take 10 mg by mouth every morning.   Yes [provider]  Multiple Vitamin (MULTIVITAMIN WITH MINERALS) TABS tablet Take 1 tablet by mouth daily with breakfast.   Yes [provider]     Vitals:   01/26/24 2320 01/27/24 0420 01/27/24 1935 01/27/24 2100  BP: 128/72 (!) 113/43    Pulse: 77 81 71   Resp: 18 18 18    Temp: 97.9 F (36.6 C) 98.2 F (36.8 C)  98.9 F (37.2 C)  TempSrc: Oral Oral  Oral  SpO2: 97% 95%    Weight:      Height:       Exam Gen alert, no  distress,  obese WM lying at 45 deg No rash, cyanosis or gangrene Sclera anicteric, throat clear  No jvd or bruits Chest clear bilat to bases, no rales/ wheezing RRR no MRG Abd soft ntnd no mass or ascites +bs GU defer MS no joint effusions or deformity Ext no pitting LE or UE edema, no other edema Neuro is alert, Ox 3 , nf   Renal-related home meds: - none   Date   Creat  eGFR (ml/min) 2014- 2017  0.82- 1.02 2018   2.77 >> 1.60  AKI episode 2022   0.98- 1.02 2023   0.90 11/16/23  0.88 01/24/24  1.92 3/07   2.31 3/08  2.35 01/27/24  2.28    Assessment/ Plan: AKI - b/l creatinine is 0.88 from dec 2024, eGFR > 60 ml/min. Creat here was 1.9 on admission and peaked at 2.35 yest and is down to 2.28 today. The UA looked infected, and the CAT scan and renal ultrasound both showed 2 kidneys without hydronephrosis.  No nephrotoxic medications.  No ACE inhibitor or ARB.  No contrast.  May still be volume depleted due to his time at home being sick and febrile.  Plan 2.5 L bolus overnight and follow-up lab in the morning.  Suspect AKI due to hemodynamic/ volume depletion, less likely ATN.  Will follow.  UTI - getting IV Rocephin , per pmd Osteomyelitis - of R foot. Seen by Dr Lajoyce Corners.  Obesity      Vinson Moselle  MD [facility] 01/27/2024, 9:56 PM  Recent Labs  Lab 01/26/24 0550 01/27/24 0547  HGB 12.3* 11.7*  CALCIUM 8.0* 8.0*  CREATININE 2.35* 2.28*  K 3.9 3.7   Inpatient medications:  enoxaparin (LOVENOX) injection  80 mg Subcutaneous Q24H   sodium chloride flush  3 mL Intravenous Q12H    cefTRIAXone (ROCEPHIN)  IV 2 g (01/27/24 1220)   acetaminophen **OR** acetaminophen, polyethylene glycol

## 2024-01-27 NOTE — Plan of Care (Signed)

## 2024-01-27 NOTE — Progress Notes (Signed)
 PT Cancellation Note  Patient Details Name: Evan Hamilton MRN: [id number] DOB: [birth year]   Cancelled Treatment:    Reason Eval/Treat Not Completed: OT screened, no needs identified, will sign off (Please re-consult if further needs arise.)  Harrel Carina, DPT, CLT  Acute Rehabilitation Services Office: [phone] (Secure chat preferred)   Claudia Desanctis 01/27/2024, 4:33 PM

## 2024-01-28 ENCOUNTER — Other Ambulatory Visit (HOSPITAL_COMMUNITY): Payer: Self-pay

## 2024-01-28 DIAGNOSIS — A419 Sepsis, unspecified organism: Secondary | ICD-10-CM | POA: Diagnosis not present

## 2024-01-28 LAB — PHOSPHORUS: Phosphorus: 3.2 mg/dL (ref 2.5–4.6)

## 2024-01-28 LAB — CBC
HCT: 32.8 % — ABNORMAL LOW (ref 39.0–52.0)
Hemoglobin: 11.1 g/dL — ABNORMAL LOW (ref 13.0–17.0)
MCH: 30.5 pg (ref 26.0–34.0)
MCHC: 33.8 g/dL (ref 30.0–36.0)
MCV: 90.1 fL (ref 80.0–100.0)
Platelets: 111 10*3/uL — ABNORMAL LOW (ref 150–400)
RBC: 3.64 MIL/uL — ABNORMAL LOW (ref 4.22–5.81)
RDW: 13.3 % (ref 11.5–15.5)
WBC: 6.7 10*3/uL (ref 4.0–10.5)
nRBC: 0 % (ref 0.0–0.2)

## 2024-01-28 LAB — BRAIN NATRIURETIC PEPTIDE: B Natriuretic Peptide: 143.5 pg/mL — ABNORMAL HIGH (ref 0.0–100.0)

## 2024-01-28 LAB — BASIC METABOLIC PANEL
Anion gap: 3 — ABNORMAL LOW (ref 5–15)
BUN: 29 mg/dL — ABNORMAL HIGH (ref 6–20)
CO2: 23 mmol/L (ref 22–32)
Calcium: 7.9 mg/dL — ABNORMAL LOW (ref 8.9–10.3)
Chloride: 114 mmol/L — ABNORMAL HIGH (ref 98–111)
Creatinine, Ser: 2.32 mg/dL — ABNORMAL HIGH (ref 0.61–1.24)
GFR, Estimated: 32 mL/min — ABNORMAL LOW (ref >60)
Glucose, Bld: 99 mg/dL (ref 70–99)
Potassium: 3.7 mmol/L (ref 3.5–5.1)
Sodium: 140 mmol/L (ref 135–145)

## 2024-01-28 LAB — MAGNESIUM: Magnesium: 1.9 mg/dL (ref 1.7–2.4)

## 2024-01-28 MED ORDER — CEPHALEXIN 500 MG PO CAPS
500.0000 mg | ORAL_CAPSULE | Freq: Two times a day (BID) | ORAL | 0 refills | Status: AC
  Filled 2024-01-28: qty 12, 6d supply, fill #0

## 2024-01-28 NOTE — Plan of Care (Signed)

## 2024-01-28 NOTE — Progress Notes (Signed)
 [facility] PROGRESS NOTE   Evan Hamilton ZOX:[id number] DOB: [birth year] DOA: 01/24/2024  PCP: Evan Hamilton, No Pcp Per  Brief History: 58 y.o. 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 Complicated by wound dehisences . Sees ortho regularly.  Came in with complaints of difficulty urinating.  Developed fever.  Noted to have abnormal UA in the ER.  Was hospitalized for further management.   Consultants: Orthopedics, nephrology  Procedures:  CT stone protocol, renal ultrasound.  Both nonacute.   Subjective:  Evan Hamilton in bed, appears comfortable, denies any headache, no fever, no chest pain or pressure, no shortness of breath , no abdominal pain. No focal weakness.   Assessment/Plan:  Acute urinary tract infection with sepsis, present on admission along with AKI. Cultures without any growth.  However UA was noted to be abnormal.  And he did have symptoms suggesting UTI. Continue ceftriaxone for now.  Anticipate change to oral antibiotics tomorrow if kidney function has improved. Sepsis pathophysiology has resolved, follow final culture results, has been adequately hydrated but creatinine continues to stay elevated, CT stone protocol and renal ultrasound both nonacute, he has been adequately hydrated renal function still not improving will defer further management to nephrology who is on board.  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 Underwent MRI which showed abnormal findings raising concern for osteomyelitis. ESR 90.  CRP 17.6. Seen by Dr. Lajoyce Corners yesterday who recommended below-knee amputation.  Evan Hamilton wants to think about it.  Decisions to be made in the outpatient setting.  No antibiotics recommended by orthopedics. Seen by wound care as well.  Normocytic anemia/thrombocytopenia. No evidence for overt bleeding.   Monitor closely.  Hemoglobin is stable.  Obesity Estimated body mass index is 44.89 kg/m as calculated from the following:   Height as of this encounter: 6\' 2"  (1.88 m).   Weight as of this encounter: 158.6 kg.  DVT Prophylaxis: Lovenox Code Status: Full code Family Communication: Discussed with the Evan Hamilton Disposition Plan: Hopefully return home when improved once renal function starts improving.  Status is: Inpatient Remains inpatient appropriate because: Need for IV antibiotics.  AKI  Medications: Scheduled:  enoxaparin (LOVENOX) injection  80 mg Subcutaneous Q24H   sodium chloride flush  3 mL Intravenous Q12H   Continuous:  cefTRIAXone (ROCEPHIN)  IV 2 g (01/27/24 1220)   WJX:BJYNWGNFAOZHY **OR** acetaminophen, polyethylene glycol  Antibiotics: Anti-infectives (From admission, onward)    Start     Dose/Rate Route Frequency Ordered Stop   01/24/24 1300  vancomycin (VANCOCIN) IVPB 1000 mg/200 mL premix  Status:  Discontinued        1,000 mg 200 mL/hr over 60 Minutes Intravenous  Once 01/24/24 1256 01/24/24 1259   01/24/24 1300  cefTRIAXone (ROCEPHIN) 2 g in sodium chloride 0.9 % 100 mL IVPB        2 g 200 mL/hr over 30 Minutes Intravenous Every 24 hours 01/24/24 1256     01/24/24 1300  vancomycin (VANCOREADY) IVPB 2000 mg/400 mL        2,000 mg 200 mL/hr over 120 Minutes Intravenous  Once 01/24/24 1259 01/24/24 1526       Objective:  Vital Signs  Vitals:   01/27/24 2155 01/27/24 2307 01/28/24 0500 01/28/24 0827  BP:    117/71  Pulse:    66  Resp:    18  Temp:  TempSrc:      SpO2: 95%   98%  Weight:  (!) 157.6 kg (!) 158.6 kg   Height:        Intake/Output Summary (Last 24 hours) at 01/28/2024 1044 Last data filed at 01/28/2024 0715 Gross per 24 hour  Intake 4469 ml  Output 3906 ml  Net 563 ml   Filed Weights   01/24/24 2138 01/27/24 2307 01/28/24 0500  Weight: (!) 156.1 kg (!) 157.6 kg (!) 158.6 kg    Exam  Awake Alert, No new F.N deficits,  Normal affect [place].AT,PERRAL Supple Neck, No JVD,   Symmetrical Chest wall movement, Good air movement bilaterally, CTAB RRR,No Gallops, Rubs or new Murmurs,  +ve B.Sounds, Abd Soft, No tenderness,   No Cyanosis, Clubbing or edema    Lab Results:  Data Reviewed: I have personally reviewed following labs and reports of the imaging studies  CBC: Recent Labs  Lab 01/24/24 1055 01/25/24 0538 01/26/24 0550 01/27/24 0547 01/28/24 0525  WBC 20.9* 14.9* 8.9 6.7 6.7  NEUTROABS 18.8*  --   --   --   --   HGB 13.5 12.5* 12.3* 11.7* 11.1*  HCT 39.5 36.1* 36.4* 34.3* 32.8*  MCV 90.4 90.5 90.3 89.6 90.1  PLT 121* 107* 112* 104* 111*    Basic Metabolic Panel: Recent Labs  Lab 01/24/24 1055 01/25/24 0538 01/26/24 0550 01/27/24 0547 01/28/24 0525  NA 132* 133* 137 139 140  K 3.3* 3.8 3.9 3.7 3.7  CL 100 104 105 111 114*  CO2 21* 21* 24 17* 23  GLUCOSE 151* 103* 123* 125* 99  BUN 36* 41* 39* 33* 29*  CREATININE 1.92* 2.31* 2.35* 2.28* 2.32*  CALCIUM 8.5* 7.6* 8.0* 8.0* 7.9*  MG  --   --   --   --  1.9  PHOS  --   --   --   --  3.2    GFR: Estimated Creatinine Clearance: 56.7 mL/min (A) (by C-G formula based on SCr of 2.32 mg/dL (H)).   Coagulation Profile: Recent Labs  Lab 01/24/24 1258 01/25/24 0538  INR 1.2 1.2     Recent Results (from the past 240 hours)  Resp panel by RT-PCR (RSV, Flu A&B, Covid) Anterior Nasal Swab     Status: None   Collection Time: 01/24/24 10:35 AM   Specimen: Anterior Nasal Swab  Result Value Ref Range Status   SARS Coronavirus 2 by RT PCR NEGATIVE NEGATIVE Final    Comment: (NOTE) SARS-CoV-2 target nucleic acids are NOT DETECTED.  The SARS-CoV-2 RNA is generally detectable in upper respiratory specimens during the acute phase of infection. The lowest concentration of SARS-CoV-2 viral copies this assay can detect is 138 copies/mL. A negative result does not preclude SARS-Cov-2 infection and should not be used as the sole basis for  treatment or other Evan Hamilton management decisions. A negative result may occur with  improper specimen collection/handling, submission of specimen other than nasopharyngeal swab, presence of viral mutation(s) within the areas targeted by this assay, and inadequate number of viral copies(<138 copies/mL). A negative result must be combined with clinical observations, Evan Hamilton history, and epidemiological information. The expected result is Negative.  Fact Sheet for Patients:  BloggerCourse.com  Fact Sheet for Healthcare Providers:  SeriousBroker.it  This test is no t yet approved or cleared by the [place] FDA and  has been authorized for detection and/or diagnosis of SARS-CoV-2 by FDA under an Emergency Use Authorization (EUA). This EUA will remain  in effect (meaning this  test can be used) for the duration of the COVID-19 declaration under Section 564(b)(1) of the Act, 21 U.S.C.section 360bbb-3(b)(1), unless the authorization is terminated  or revoked sooner.       Influenza A by PCR NEGATIVE NEGATIVE Final   Influenza B by PCR NEGATIVE NEGATIVE Final    Comment: (NOTE) The Xpert Xpress SARS-CoV-2/FLU/RSV plus assay is intended as an aid in the diagnosis of influenza from Nasopharyngeal swab specimens and should not be used as a sole basis for treatment. Nasal washings and aspirates are unacceptable for Xpert Xpress SARS-CoV-2/FLU/RSV testing.  Fact Sheet for Patients: BloggerCourse.com  Fact Sheet for Healthcare Providers: SeriousBroker.it  This test is not yet approved or cleared by the [place] FDA and has been authorized for detection and/or diagnosis of SARS-CoV-2 by FDA under an Emergency Use Authorization (EUA). This EUA will remain in effect (meaning this test can be used) for the duration of the COVID-19 declaration under Section 564(b)(1) of the Act, 21  U.S.C. section 360bbb-3(b)(1), unless the authorization is terminated or revoked.     Resp Syncytial Virus by PCR NEGATIVE NEGATIVE Final    Comment: (NOTE) Fact Sheet for Patients: BloggerCourse.com  Fact Sheet for Healthcare Providers: SeriousBroker.it  This test is not yet approved or cleared by the [place] FDA and has been authorized for detection and/or diagnosis of SARS-CoV-2 by FDA under an Emergency Use Authorization (EUA). This EUA will remain in effect (meaning this test can be used) for the duration of the COVID-19 declaration under Section 564(b)(1) of the Act, 21 U.S.C. section 360bbb-3(b)(1), unless the authorization is terminated or revoked.  Performed at [facility], [address] [place], [place] [address]   Blood Culture (routine x 2)     Status: None (Preliminary result)   Collection Time: 01/24/24 12:58 PM   Specimen: BLOOD LEFT ARM  Result Value Ref Range Status   Specimen Description   Final    BLOOD LEFT ARM Performed at [facility], [address], [place], [place] [address]    Special Requests   Final    BOTTLES DRAWN AEROBIC AND ANAEROBIC Blood Culture adequate volume Performed at [facility], [address] [place], [place] [address]    Culture   Final    NO GROWTH 4 DAYS Performed at [facility], [address], [place], [place] [address]    Report Status PENDING  Incomplete  Blood Culture (routine x 2)     Status: None (Preliminary result)   Collection Time: 01/24/24 12:58 PM   Specimen: BLOOD RIGHT ARM  Result Value Ref Range Status   Specimen Description   Final    BLOOD RIGHT ARM Performed at [facility], [address], [place], [place] [address]    Special Requests   Final    BOTTLES DRAWN AEROBIC AND ANAEROBIC Blood Culture adequate volume Performed at [facility], [address],  [place], [place] [address]    Culture   Final    NO GROWTH 4 DAYS Performed at [facility], [address], [place], [place] [address]    Report Status PENDING  Incomplete  Urine Culture     Status: Abnormal   Collection Time: 01/24/24  3:24 PM   Specimen: Urine, Random  Result Value Ref Range Status   Specimen Description   Final    URINE, RANDOM Performed at [facility], [address], [place], [place] [address]  Special Requests URINE, CLEAN CATCH  Final   Culture (A)  Final    <10,000 COLONIES/mL INSIGNIFICANT GROWTH Performed at [facility], [address], [place], [place] [address]    Report Status 01/25/2024 FINAL  Final      Radiology Studies: US RENAL Result Date: 01/27/2024 CLINICAL DATA:  [id number] AKI (acute kidney injury) (HCC) [id number] EXAM: RENAL / URINARY TRACT ULTRASOUND COMPLETE COMPARISON:  January 24, 2024 FINDINGS: Evaluation is limited by limited acoustic windows and body habitus. Right Kidney: Renal measurements: 15.4 x 6.4 x 8.4 cm = volume: 431 mL. Echogenicity is within the upper limits of normal. No definitive mass or hydronephrosis visualized. Left Kidney: Renal measurements: 15.0 x 5.9 x 9.3 cm = volume: 433 mL. Echogenicity is within the upper limits of normal. No definitive hydronephrosis. In the inferior pole there is a previously characterized benign cyst measuring up to 3.7 cm (for which no dedicated imaging follow-up is recommended). Bladder: Not visualized secondary to shadowing bowel gas. Other: None. IMPRESSION: No hydronephrosis. Electronically Signed   By: Meda Klinefelter M.D.   On: 01/27/2024 15:31     LOS: 4 days   Signature  -    Susa Raring M.D on 01/28/2024 at 10:44 AM   -  To page go to [email or web address]

## 2024-01-28 NOTE — Progress Notes (Addendum)
 [facility] NEPHROLOGY PROGRESS NOTE  Assessment/ Plan: Pt is a 58 y.o. yo male with medical history of osteomyelitis and amputation of right fifth toe in 11/2023 complicated by wound dehiscence presented with UTI-like symptoms.  # AKI - b/l creatinine is 0.88 from dec 2024, eGFR > 60 ml/min.  UA with UTI, US kidneys without hydronephrosis.  No nephrotoxins, ACE inhibitor, ARB or diuretics.  Patient was hydrated with fluid and serum creatinine level fluctuating around 2.2-2.3 last 2 days.  Urine output is increasing.  He has no signs or symptoms of uremia.  He wants to go home today.  No objection to discharge him from nephrology perspective.  We will arrange outpatient follow-up at [facility] in next few weeks.  Recommend to check urine for 2 weeks.  # UTI - getting IV Rocephin , per pmd switching to oral.  # Osteomyelitis - of R foot. Seen by Dr Lajoyce Corners.  Likely need surgical intervention.  #Obesity  Discussed with the primary team Dr. Thedore Mins.  Subjective: Seen and examined at bedside.  Patient reported doing well without any nausea, vomiting, dysgeusia, chest pain, shortness of breath.  He wants to go home.  Urine output is recorded 3.6 L. Objective Vital signs in last 24 hours: Vitals:   01/27/24 2307 01/28/24 0500 01/28/24 0827 01/28/24 1152  BP:   117/71 (!) 156/76  Pulse:   66 67  Resp:   18 19  Temp:    (P) 98.3 F (36.8 C)  TempSrc:      SpO2:   98% 98%  Weight: (!) 157.6 kg (!) 158.6 kg    Height:       Weight change:   Intake/Output Summary (Last 24 hours) at 01/28/2024 1153 Last data filed at 01/28/2024 0715 Gross per 24 hour  Intake 4349 ml  Output 3906 ml  Net 443 ml       Labs: RENAL PANEL Recent Labs  Lab 01/24/24 1055 01/25/24 0538 01/26/24 0550 01/27/24 0547 01/28/24 0525  NA 132* 133* 137 139 140  K 3.3* 3.8 3.9 3.7 3.7  CL 100 104 105 111 114*  CO2 21* 21* 24 17* 23  GLUCOSE 151* 103* 123* 125* 99  BUN 36* 41* 39*  33* 29*  CREATININE 1.92* 2.31* 2.35* 2.28* 2.32*  CALCIUM 8.5* 7.6* 8.0* 8.0* 7.9*  MG  --   --   --   --  1.9  PHOS  --   --   --   --  3.2    Liver Function Tests: No results for input(s): "AST", "ALT", "ALKPHOS", "BILITOT", "PROT", "ALBUMIN" in the last 168 hours. No results for input(s): "LIPASE", "AMYLASE" in the last 168 hours. No results for input(s): "AMMONIA" in the last 168 hours. CBC: Recent Labs    01/24/24 1055 01/25/24 0538 01/26/24 0550 01/27/24 0547 01/28/24 0525  HGB 13.5 12.5* 12.3* 11.7* 11.1*  MCV 90.4 90.5 90.3 89.6 90.1    Cardiac Enzymes: No results for input(s): "CKTOTAL", "CKMB", "CKMBINDEX", "TROPONINI" in the last 168 hours. CBG: No results for input(s): "GLUCAP" in the last 168 hours.  Iron Studies: No results for input(s): "IRON", "TIBC", "TRANSFERRIN", "FERRITIN" in the last 72 hours. Studies/Results: US RENAL Result Date: 01/27/2024 CLINICAL DATA:  [id number] AKI (acute kidney injury) [facility]) [id number] EXAM: RENAL / URINARY TRACT ULTRASOUND COMPLETE COMPARISON:  January 24, 2024 FINDINGS: Evaluation is limited by limited acoustic windows and body habitus. Right Kidney: Renal measurements: 15.4 x 6.4 x 8.4 cm = volume: 431  mL. Echogenicity is within the upper limits of normal. No definitive mass or hydronephrosis visualized. Left Kidney: Renal measurements: 15.0 x 5.9 x 9.3 cm = volume: 433 mL. Echogenicity is within the upper limits of normal. No definitive hydronephrosis. In the inferior pole there is a previously characterized benign cyst measuring up to 3.7 cm (for which no dedicated imaging follow-up is recommended). Bladder: Not visualized secondary to shadowing bowel gas. Other: None. IMPRESSION: No hydronephrosis. Electronically Signed   By: Meda Klinefelter M.D.   On: 01/27/2024 15:31    Medications: Infusions:  cefTRIAXone (ROCEPHIN)  IV 2 g (01/27/24 1220)    Scheduled Medications:  enoxaparin (LOVENOX) injection  80 mg Subcutaneous Q24H   sodium  chloride flush  3 mL Intravenous Q12H    have reviewed scheduled and prn medications.  Physical Exam: General:NAD, comfortable Heart:RRR, s1s2 nl Lungs:clear b/l, no crackle Abdomen:soft, Non-tender, non-distended Extremities:No edema. Neurology: Alert awake and following commands.  Keaghan Bowens Prasad Athina Fahey 01/28/2024,11:53 AM  LOS: 4 days

## 2024-01-28 NOTE — Discharge Summary (Signed)
 Evan Hamilton JYN:[id number] DOB: March 07, 1966 DOA: 01/24/2024  PCP: Patient, No Pcp Per  Admit date: 01/24/2024  Discharge date: 01/28/2024  Admitted From: Home   Disposition:  Home   Recommendations for Outpatient Follow-up:   Follow up with PCP in 1-2 weeks  PCP Please obtain BMP/CBC, 2 view CXR in 1week,  (see Discharge instructions)   PCP Please follow up on the following pending results:    Home Health: None   Equipment/Devices: None  Consultations: Nephrology, orthopedics Discharge Condition: Stable    CODE STATUS: Full    Diet Recommendation: Heart Healthy      Chief Complaint  Patient presents with   Fever   Cough   Generalized Body Aches     Brief history of present illness from the day of admission and additional interim summary    58 y.o. 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 Complicated by wound dehisences . Sees ortho regularly.  Came in with complaints of difficulty urinating.  Developed fever.  Noted to have abnormal UA in the ER.  Was hospitalized for further management.                                                                   Hospital Course   Acute urinary tract infection with sepsis, present on admission along with AKI. Cultures conclusive thus far but he has responded significantly to empiric IV Rocephin hence will be transition to oral Keflex, has been adequately hydrated seen by nephrology, CT stone protocol and renal ultrasound both nonacute, case discussed with nephrologist Dr. Ronalee Belts on 01/28/2024 per nephrology cleared for discharge with outpatient nephrology follow-up.  Patient is currently symptom-free and eager to go home.   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 Underwent MRI which showed abnormal findings raising concern for osteomyelitis. ESR 90.  CRP 17.6. Seen by Dr. Lajoyce Corners yesterday who recommended below-knee amputation.  Patient wants to think about it.  Decisions to be made in the outpatient setting.  No antibiotics recommended by orthopedics. Seen by wound care as well.  He will follow-up with Dr. Lajoyce Corners within a week of discharge.   Normocytic anemia/thrombocytopenia. Chronic, PCP to monitor.  Discharge diagnosis     Principal Problem:   Sepsis (HCC) Active Problems:   AKI (acute kidney injury) (HCC)   History of amputation of lesser toe of right foot (HCC)   Subacute osteomyelitis of right foot [facility])    Discharge instructions    Discharge Instructions     Diet - low sodium heart healthy   Complete by: As directed    Discharge instructions   Complete by: As directed  Right lower extremity keep the wound clean and dry.  Daily dry dressing.    Follow with Primary MD in 7 days, also follow-up with the requested orthopedic surgeon and [place] [facility] in 1 to 2 weeks  Get CBC, CMP, 2 view Chest X ray -  checked next visit with your primary MD   Activity: As tolerated with Full fall precautions use walker/cane & assistance as needed  Disposition Home    Diet: Heart Healthy    Special Instructions: If you have smoked or chewed Tobacco  in the last 2 yrs please stop smoking, stop any regular Alcohol  and or any Recreational drug use.  On your next visit with your primary care physician please Get Medicines reviewed and adjusted.  Please request your Prim.MD to go over all Hospital Tests and Procedure/Radiological results at the follow up, please get all Hospital records sent to your Prim MD by signing hospital release before you go home.  If you experience worsening of your admission symptoms, develop shortness of breath, life threatening emergency, suicidal or  homicidal thoughts you must seek medical attention immediately by calling 911 or calling your MD immediately  if symptoms less severe.  You Must read complete instructions/literature along with all the possible adverse reactions/side effects for all the Medicines you take and that have been prescribed to you. Take any new Medicines after you have completely understood and accpet all the possible adverse reactions/side effects.   Do not drive when taking Pain medications.  Do not take more than prescribed Pain, Sleep and Anxiety Medications  Wear Seat belts while driving.   Increase activity slowly   Complete by: As directed    No wound care   Complete by: As directed        Discharge Medications   Allergies as of 01/28/2024       Reactions   Penicillins Hives, Rash, Other (See Comments)   Tolerates Cephalosporins        Medication List     STOP taking these medications    HYDROcodone-acetaminophen 5-325 MG tablet Commonly known as: NORCO/VICODIN       TAKE these medications    APPLE CIDER VINEGAR PO Take 2 tablets by mouth daily.   cephALEXin 500 MG capsule Commonly known as: KEFLEX Take 1 capsule (500 mg total) by mouth 2 (two) times daily for 6 days.   Ginkgo Biloba 120 MG Caps Take 120 mg by mouth in the morning.   loratadine 10 MG tablet Commonly known as: CLARITIN Take 10 mg by mouth every morning.   multivitamin with minerals Tabs tablet Take 1 tablet by mouth daily with breakfast.         Follow-up Information     Nadara Mustard, MD Follow up in 1 week(s).   Specialty: Orthopedic Surgery Contact information: [address] [place] [address] [phone]         [facility]. Schedule an appointment as soon as possible for a visit in 1 week(s).   Contact information: [address] [place] [address] [phone]        Maxie Barb, MD Follow up.    Specialties: Nephrology, Internal Medicine Contact information: [address] [place] [address] [phone]                 Major procedures and Radiology Reports - PLEASE review detailed and final reports thoroughly  -      US  RENAL Result Date: 01/27/2024 CLINICAL DATA:  [id number] AKI (acute kidney injury) [facility]) [id number] EXAM: RENAL / URINARY TRACT ULTRASOUND COMPLETE COMPARISON:  January 24, 2024 FINDINGS: Evaluation is limited by limited acoustic windows and body habitus. Right Kidney: Renal measurements: 15.4 x 6.4 x 8.4 cm = volume: 431 mL. Echogenicity is within the upper limits of normal. No definitive mass or hydronephrosis visualized. Left Kidney: Renal measurements: 15.0 x 5.9 x 9.3 cm = volume: 433 mL. Echogenicity is within the upper limits of normal. No definitive hydronephrosis. In the inferior pole there is a previously characterized benign cyst measuring up to 3.7 cm (for which no dedicated imaging follow-up is recommended). Bladder: Not visualized secondary to shadowing bowel gas. Other: None. IMPRESSION: No hydronephrosis. Electronically Signed   By: Meda Klinefelter M.D.   On: 01/27/2024 15:31   MR FOOT RIGHT W WO CONTRAST Result Date: 01/25/2024 CLINICAL DATA:  Slow healing surgical wound after fifth ray amputation. EXAM: MRI OF THE RIGHT FOREFOOT WITHOUT AND WITH CONTRAST TECHNIQUE: Multiplanar, multisequence MR imaging of the right forefoot was performed before and after the administration of intravenous contrast. CONTRAST:  10mL GADAVIST GADOBUTROL 1 MMOL/ML IV SOLN COMPARISON:  Right foot x-rays dated January 24, 2024. FINDINGS: Bones/Joint/Cartilage Prominent marrow edema with corresponding decreased T1 marrow signal and bony erosion involving the cuboid. Associated marrow enhancement. Milder marrow edema with associated enhancement involving the fourth metatarsal base. Prior third and fifth ray amputations. Apparent increased T2 signal and enhancement at the tip of the first  distal phalanx is likely artifactual and due to poor fat saturation given no associated marrow signal abnormality STIR sequence. No fracture or dislocation. Talonavicular degenerative changes. No joint effusion. Mild pes planus. Ligaments Lisfranc ligament is intact. Muscles and Tendons No tenosynovitis.  Atrophy of the intrinsic foot muscles. Soft tissue Soft tissue ulceration along the lateral midfoot at the level of the cuboid. Surrounding soft tissue swelling and enhancement. No fluid collection. No soft tissue mass. IMPRESSION: 1. 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 No abscess. 2. Apparent increased T2 signal and enhancement at the tip of the first distal phalanx is likely artifactual and due to poor fat saturation given no associated marrow signal abnormality STIR sequence. Correlate with physical exam to exclude great toe skin breakdown. Electronically Signed   By: Obie Dredge M.D.   On: 01/25/2024 08:48   CT RENAL STONE STUDY Result Date: 01/24/2024 CLINICAL DATA:  Flu like symptoms. EXAM: CT ABDOMEN AND PELVIS WITHOUT CONTRAST TECHNIQUE: Multidetector CT imaging of the abdomen and pelvis was performed following the standard protocol without IV contrast. RADIATION DOSE REDUCTION: This exam was performed according to the departmental dose-optimization program which includes automated exposure control, adjustment of the mA and/or kV according to patient size and/or use of iterative reconstruction technique. COMPARISON:  November 25, 2016 FINDINGS: Lower chest: Mild atelectatic changes are seen within the posterior aspect of the left lung base peer Hepatobiliary: No focal liver abnormality is seen. Status post cholecystectomy. No biliary dilatation. Pancreas: Unremarkable. No pancreatic ductal dilatation or surrounding inflammatory changes. Spleen: Normal in size without focal abnormality. Adrenals/Urinary Tract:  Adrenal glands are unremarkable. Kidneys are normal in size, without renal calculi or hydronephrosis. A 3.6 cm diameter cyst is seen within the lower pole of the left kidney. Bladder is unremarkable. Stomach/Bowel: Stomach is within normal limits. Appendix appears normal. No evidence of bowel wall thickening, distention, or inflammatory changes. Vascular/Lymphatic: Aortic atherosclerosis.  No enlarged abdominal or pelvic lymph nodes. Reproductive: The prostate gland is mildly enlarged. Other: A 4.2 cm x 5.5 cm x 8.8 cm fat containing umbilical hernia is noted. No abdominopelvic ascites. Musculoskeletal: A chronic compression fracture deformity is seen at the level of T12. Multilevel degenerative changes are noted throughout the lumbar spine. IMPRESSION: 1. Evidence of prior cholecystectomy. 2. Large fat-containing umbilical hernia. 3. Chronic compression fracture deformity at the level of T12. 4. Aortic atherosclerosis. Aortic Atherosclerosis (ICD10-I70.0). Electronically Signed   By: Aram Candela M.D.   On: 01/24/2024 21:57   DG Foot 2 Views Right Result Date: 01/24/2024 CLINICAL DATA:  Previous amputations.  Swelling.  Gangrene EXAM: RIGHT FOOT - 2 VIEW COMPARISON:  Foot x-ray 11/16/2023. FINDINGS: Interval amputation of the fifth ray including the phalanges and metatarsal. Mild soft tissue swelling. There has been previous amputation of the phalanges of the third digit and the distal half of the third metatarsal. No fracture or dislocation. Degenerative changes of the midfoot. Well corticated small plantar calcaneal spur. There is a small lucency along the distal aspect of the cuboid bone which was not clearly seen previous. This could be postsurgical but is indeterminate. If there is further concern of additional findings of bone or soft tissue infection additional workup could be performed with MRI or bone scan as clinically appropriate. IMPRESSION: Interval resection of the fifth ray. Soft tissue  swelling. There is a subtle lucency along the distal aspect of the cuboid bone is not clearly seen previous but this could be postsurgical or projectional. Additional workup as clinically appropriate. Electronically Signed   By: Karen Kays M.D.   On: 01/24/2024 16:34   DG Chest Port 1 View Result Date: 01/24/2024 CLINICAL DATA:  Sepsis EXAM: PORTABLE CHEST 1 VIEW COMPARISON:  X-ray 07/10/2014. FINDINGS: Under penetrated underinflated radiograph. Slight left basilar atelectasis or scar. No consolidation, pneumothorax or effusion. No edema. Normal cardiopericardial silhouette. Overlapping cardiac leads. IMPRESSION: Limited x-ray. Persistent left basilar scar or atelectasis. No consolidation. Electronically Signed   By: Karen Kays M.D.   On: 01/24/2024 16:05    Micro Results    Recent Results (from the past 240 hours)  Resp panel by RT-PCR (RSV, Flu A&B, Covid) Anterior Nasal Swab     Status: None   Collection Time: 01/24/24 10:35 AM   Specimen: Anterior Nasal Swab  Result Value Ref Range Status   SARS Coronavirus 2 by RT PCR NEGATIVE NEGATIVE Final    Comment: (NOTE) SARS-CoV-2 target nucleic acids are NOT DETECTED.  The SARS-CoV-2 RNA is generally detectable in upper respiratory specimens during the acute phase of infection. The lowest concentration of SARS-CoV-2 viral copies this assay can detect is 138 copies/mL. A negative result does not preclude SARS-Cov-2 infection and should not be used as the sole basis for treatment or other patient management decisions. A negative result may occur with  improper specimen collection/handling, submission of specimen other than nasopharyngeal swab, presence of viral mutation(s) within the areas targeted by this assay, and inadequate number of viral copies(<138 copies/mL). A negative result must be combined with clinical observations, patient history, and epidemiological information. The expected result is Negative.  Fact Sheet for Patients:   BloggerCourse.com  Fact Sheet for Healthcare Providers:  SeriousBroker.it  This test is no t yet approved or cleared by the [place] FDA and  has been authorized for detection and/or diagnosis of SARS-CoV-2 by FDA under an Emergency Use Authorization (EUA). This EUA will remain  in effect (meaning this  test can be used) for the duration of the COVID-19 declaration under Section 564(b)(1) of the Act, 21 U.S.C.section 360bbb-3(b)(1), unless the authorization is terminated  or revoked sooner.       Influenza A by PCR NEGATIVE NEGATIVE Final   Influenza B by PCR NEGATIVE NEGATIVE Final    Comment: (NOTE) The Xpert Xpress SARS-CoV-2/FLU/RSV plus assay is intended as an aid in the diagnosis of influenza from Nasopharyngeal swab specimens and should not be used as a sole basis for treatment. Nasal washings and aspirates are unacceptable for Xpert Xpress SARS-CoV-2/FLU/RSV testing.  Fact Sheet for Patients: BloggerCourse.com  Fact Sheet for Healthcare Providers: SeriousBroker.it  This test is not yet approved or cleared by the [place] FDA and has been authorized for detection and/or diagnosis of SARS-CoV-2 by FDA under an Emergency Use Authorization (EUA). This EUA will remain in effect (meaning this test can be used) for the duration of the COVID-19 declaration under Section 564(b)(1) of the Act, 21 U.S.C. section 360bbb-3(b)(1), unless the authorization is terminated or revoked.     Resp Syncytial Virus by PCR NEGATIVE NEGATIVE Final    Comment: (NOTE) Fact Sheet for Patients: BloggerCourse.com  Fact Sheet for Healthcare Providers: SeriousBroker.it  This test is not yet approved or cleared by the [place] FDA and has been authorized for detection and/or diagnosis of SARS-CoV-2 by FDA under an Emergency Use  Authorization (EUA). This EUA will remain in effect (meaning this test can be used) for the duration of the COVID-19 declaration under Section 564(b)(1) of the Act, 21 U.S.C. section 360bbb-3(b)(1), unless the authorization is terminated or revoked.  Performed at [facility] [place] [facility], [address] [place], [place] [address]   Blood Culture (routine x 2)     Status: None (Preliminary result)   Collection Time: 01/24/24 12:58 PM   Specimen: BLOOD LEFT ARM  Result Value Ref Range Status   Specimen Description   Final    BLOOD LEFT ARM Performed at [facility], [address], [place], [place] [address]    Special Requests   Final    BOTTLES DRAWN AEROBIC AND ANAEROBIC Blood Culture adequate volume Performed at [facility], [address] [place], [place] [address]    Culture   Final    NO GROWTH 4 DAYS Performed at [facility], [address] [place], [place] [address]    Report Status PENDING  Incomplete  Blood Culture (routine x 2)     Status: None (Preliminary result)   Collection Time: 01/24/24 12:58 PM   Specimen: BLOOD RIGHT ARM  Result Value Ref Range Status   Specimen Description   Final    BLOOD RIGHT ARM Performed at [facility], [address], [place], [place] [address]    Special Requests   Final    BOTTLES DRAWN AEROBIC AND ANAEROBIC Blood Culture adequate volume Performed at [facility], [address], [place], [place] [address]    Culture   Final    NO GROWTH 4 DAYS Performed at [facility], [address], [place], [place] [address]    Report Status PENDING  Incomplete  Urine Culture     Status: Abnormal   Collection Time: 01/24/24  3:24 PM   Specimen: Urine, Random  Result Value Ref Range Status   Specimen Description   Final    URINE, RANDOM Performed at [facility], [address] [place], [place] [address]  Special Requests URINE, CLEAN CATCH   Final   Culture (A)  Final    <10,000 COLONIES/mL INSIGNIFICANT GROWTH Performed at [facility], [address], [place], [place] [address]    Report Status 01/25/2024 FINAL  Final    Today   Subjective    Evan Hamilton today has no headache,no chest abdominal pain,no new weakness tingling or numbness, feels much better wants to go home today.    Objective   Blood pressure (!) 156/76, pulse 67, temperature (P) 98.3 F (36.8 C), temperature source (P) Oral, resp. rate 19, height 6\' 2"  (1.88 m), weight (!) 158.6 kg, SpO2 98%.   Intake/Output Summary (Last 24 hours) at 01/28/2024 1158 Last data filed at 01/28/2024 0715 Gross per 24 hour  Intake 4349 ml  Output 3906 ml  Net 443 ml    Exam  Awake Alert, No new F.N deficits,    Melvin.AT,PERRAL Supple Neck,   Symmetrical Chest wall movement, Good air movement bilaterally, CTAB RRR,No Gallops,   +ve B.Sounds, Abd Soft, Non tender,  No Cyanosis, Clubbing or edema    Data Review   Recent Labs  Lab 01/24/24 1055 01/25/24 0538 01/26/24 0550 01/27/24 0547 01/28/24 0525  WBC 20.9* 14.9* 8.9 6.7 6.7  HGB 13.5 12.5* 12.3* 11.7* 11.1*  HCT 39.5 36.1* 36.4* 34.3* 32.8*  PLT 121* 107* 112* 104* 111*  MCV 90.4 90.5 90.3 89.6 90.1  MCH 30.9 31.3 30.5 30.5 30.5  MCHC 34.2 34.6 33.8 34.1 33.8  RDW 13.2 13.5 13.3 13.5 13.3  LYMPHSABS 0.6*  --   --   --   --   MONOABS 1.1*  --   --   --   --   EOSABS 0.0  --   --   --   --   BASOSABS 0.1  --   --   --   --     Recent Labs  Lab 01/24/24 1055 01/24/24 1258 01/24/24 1300 01/24/24 1704 01/25/24 0538 01/26/24 0550 01/27/24 0547 01/28/24 0525  NA 132*  --   --   --  133* 137 139 140  K 3.3*  --   --   --  3.8 3.9 3.7 3.7  CL 100  --   --   --  104 105 111 114*  CO2 21*  --   --   --  21* 24 17* 23  ANIONGAP 11  --   --   --  8 8 11  3*  GLUCOSE 151*  --   --   --  103* 123* 125* 99  BUN 36*  --   --   --  41* 39* 33* 29*  CREATININE 1.92*  --   --   --  2.31* 2.35*  2.28* 2.32*  CRP  --   --   --   --   --  17.6*  --   --   LATICACIDVEN  --   --  1.2 0.8  --   --   --   --   INR  --  1.2  --   --  1.2  --   --   --   BNP  --   --   --   --   --   --  140.9* 143.5*  MG  --   --   --   --   --   --   --  1.9  PHOS  --   --   --   --   --   --   --  3.2  CALCIUM 8.5*  --   --   --  7.6* 8.0* 8.0* 7.9*    Total Time in preparing paper work, data evaluation and todays exam - 35 minutes  Signature  -    Susa Raring M.D on 01/28/2024 at 11:58 AM   -  To page go to [email or web address]

## 2024-01-28 NOTE — Discharge Instructions (Addendum)
 Right lower extremity keep the wound clean and dry.  Daily dry dressing.   Follow with Primary MD in 7 days, also follow-up with the requested orthopedic surgeon and [place] [facility] office in 1 to 2 weeks  Get CBC, CMP, 2 view Chest X ray -  checked next visit with your primary MD   Activity: As tolerated with Full fall precautions use walker/cane & assistance as needed  Disposition Home    Diet: Heart Healthy    Special Instructions: If you have smoked or chewed Tobacco  in the last 2 yrs please stop smoking, stop any regular Alcohol  and or any Recreational drug use.  On your next visit with your primary care physician please Get Medicines reviewed and adjusted.  Please request your Prim.MD to go over all Hospital Tests and Procedure/Radiological results at the follow up, please get all Hospital records sent to your Prim MD by signing hospital release before you go home.  If you experience worsening of your admission symptoms, develop shortness of breath, life threatening emergency, suicidal or homicidal thoughts you must seek medical attention immediately by calling 911 or calling your MD immediately  if symptoms less severe.  You Must read complete instructions/literature along with all the possible adverse reactions/side effects for all the Medicines you take and that have been prescribed to you. Take any new Medicines after you have completely understood and accpet all the possible adverse reactions/side effects.   Do not drive when taking Pain medications.  Do not take more than prescribed Pain, Sleep and Anxiety Medications  Wear Seat belts while driving.

## 2024-01-29 LAB — CULTURE, BLOOD (ROUTINE X 2)
Culture: NO GROWTH
Culture: NO GROWTH
Special Requests: ADEQUATE
Special Requests: ADEQUATE

## 2024-01-30 ENCOUNTER — Encounter: Payer: BC Managed Care – PPO | Admitting: Family

## 2024-02-05 ENCOUNTER — Encounter (HOSPITAL_BASED_OUTPATIENT_CLINIC_OR_DEPARTMENT_OTHER): Payer: Self-pay | Admitting: Family Medicine

## 2024-02-05 ENCOUNTER — Ambulatory Visit (HOSPITAL_BASED_OUTPATIENT_CLINIC_OR_DEPARTMENT_OTHER)

## 2024-02-05 ENCOUNTER — Ambulatory Visit (INDEPENDENT_AMBULATORY_CARE_PROVIDER_SITE_OTHER): Admitting: Family Medicine

## 2024-02-05 VITALS — BP 128/84 | HR 77 | Ht 74.0 in | Wt 335.0 lb

## 2024-02-05 DIAGNOSIS — N179 Acute kidney failure, unspecified: Secondary | ICD-10-CM | POA: Diagnosis not present

## 2024-02-05 DIAGNOSIS — E1142 Type 2 diabetes mellitus with diabetic polyneuropathy: Secondary | ICD-10-CM | POA: Diagnosis not present

## 2024-02-05 DIAGNOSIS — J9811 Atelectasis: Secondary | ICD-10-CM | POA: Diagnosis not present

## 2024-02-05 DIAGNOSIS — R0989 Other specified symptoms and signs involving the circulatory and respiratory systems: Secondary | ICD-10-CM | POA: Diagnosis not present

## 2024-02-05 DIAGNOSIS — D696 Thrombocytopenia, unspecified: Secondary | ICD-10-CM | POA: Insufficient documentation

## 2024-02-05 DIAGNOSIS — E119 Type 2 diabetes mellitus without complications: Secondary | ICD-10-CM | POA: Insufficient documentation

## 2024-02-05 LAB — CBC WITH DIFFERENTIAL/PLATELET
Basophils Absolute: 0.1 10*3/uL (ref 0.0–0.2)
Basos: 1 %
EOS (ABSOLUTE): 0.1 10*3/uL (ref 0.0–0.4)
Eos: 1 %
Hematocrit: 43.1 % (ref 37.5–51.0)
Hemoglobin: 14.3 g/dL (ref 13.0–17.7)
Immature Grans (Abs): 0.2 10*3/uL — ABNORMAL HIGH (ref 0.0–0.1)
Immature Granulocytes: 2 %
Lymphocytes Absolute: 2 10*3/uL (ref 0.7–3.1)
Lymphs: 16 %
MCH: 30.2 pg (ref 26.6–33.0)
MCHC: 33.2 g/dL (ref 31.5–35.7)
MCV: 91 fL (ref 79–97)
Monocytes Absolute: 0.9 10*3/uL (ref 0.1–0.9)
Monocytes: 7 %
Neutrophils Absolute: 9.1 10*3/uL — ABNORMAL HIGH (ref 1.4–7.0)
Neutrophils: 73 %
Platelets: 255 10*3/uL (ref 150–450)
RBC: 4.73 x10E6/uL (ref 4.14–5.80)
RDW: 13.1 % (ref 11.6–15.4)
WBC: 12.4 10*3/uL — ABNORMAL HIGH (ref 3.4–10.8)

## 2024-02-05 LAB — BASIC METABOLIC PANEL
BUN/Creatinine Ratio: 23 — ABNORMAL HIGH (ref 9–20)
BUN: 55 mg/dL — ABNORMAL HIGH (ref 6–24)
CO2: 20 mmol/L (ref 20–29)
Calcium: 10.3 mg/dL — ABNORMAL HIGH (ref 8.7–10.2)
Chloride: 102 mmol/L (ref 96–106)
Creatinine, Ser: 2.35 mg/dL — ABNORMAL HIGH (ref 0.76–1.27)
Glucose: 135 mg/dL — ABNORMAL HIGH (ref 70–99)
Potassium: 5.2 mmol/L (ref 3.5–5.2)
Sodium: 140 mmol/L (ref 134–144)
eGFR: 32 mL/min/{1.73_m2} — ABNORMAL LOW (ref >59)

## 2024-02-05 LAB — HEMOGLOBIN A1C
Est. average glucose Bld gHb Est-mCnc: 143 mg/dL
Hgb A1c MFr Bld: 6.6 % — ABNORMAL HIGH (ref 4.8–5.6)

## 2024-02-05 NOTE — Assessment & Plan Note (Signed)
 Recommended to have follow-up chest x-ray once discharged from the hospital.  Patient is amenable to proceeding with this, will complete this today

## 2024-02-05 NOTE — Progress Notes (Signed)
 New Patient Office Visit  Subjective   Patient ID: Evan Hamilton, male    DOB: [birth year]  Age: 58 y.o. MRN: [id number]  CC:  Chief Complaint  Patient presents with   New Patient (Initial Visit)    Patient is here today to get established with the practice. Was recently discharged from the hospital on 3/10. States he is getting better after being in the hospital and states urine color is getting back to normal.    HPI Evan Hamilton presents to establish care Last PCP - none, although  Hospital discharge related to sepsis from UTI: Presented to hospital with complaints of difficulty urinating.  Found to have fever as well as abnormal UA managed with UTI and did meet criteria for sepsis with admission.  Patient did respond to IV antibiotics, will transition to oral antibiotics and has done well in regards to urinary symptoms.  Osteomyelitis: Noted while in the hospital at wound on foot.  Orthopedic surgeon did evaluate and recommended below-knee amputation, however patient was reticent to proceed with this and preferred to hold off at that time.  Patient does have outpatient follow-up arranged with orthopedic surgeon to continue with monitoring and discussion of potential surgical intervention.  AKI: Noted at time of hospitalization.  His baseline creatinine is around 0.9.  Creatinine increased above 2 while in the hospital.  He was evaluated by nephrologist and does have outpatient follow-up scheduled.  He is due for recheck of labs today for monitoring  On review of chart, it does appear that hemoglobin A1c has been elevated above 6.4 in the past.  Most recent A1c was about 3 years ago.  Patient reports not necessarily being told diagnosis of diabetes in the past. Patient does endorse notable decreased sensation in both feet and that ultimately this was associated with what led to recent hospitalization.  Patient is originally from [place]. Has lived here for about 15 years. Patient works in  [employer]. He enjoys hiking, but thinks he won't be able to continue with this due to ongoing foot issue.  Outpatient Encounter Medications as of 02/05/2024  Medication Sig   APPLE CIDER VINEGAR PO Take 2 tablets by mouth daily.   Ginkgo Biloba 120 MG CAPS Take 120 mg by mouth in the morning.   Multiple Vitamin (MULTIVITAMIN WITH MINERALS) TABS tablet Take 1 tablet by mouth daily with breakfast.   loratadine (CLARITIN) 10 MG tablet Take 10 mg by mouth every morning. (Patient not taking: Reported on 02/05/2024)   No facility-administered encounter medications on file as of 02/05/2024.    Past Medical History:  Diagnosis Date   Allergy    Anxiety and depression    Diabetes mellitus without complication (HCC)    Hx of diabetes - patient has lost weight, no meds, patient states "not a diabetic on 11/22/23"   History of kidney stones    passed stones   Sleep apnea    does not wear CPAP - lost weight    Past Surgical History:  Procedure Laterality Date   AMPUTATION Left 05/22/2021   Procedure: GREAT TOE AMPUTATION;  Surgeon: Kathryne Hitch, MD;  Location: [facility] OR;  Service: Orthopedics;  Laterality: Left;   AMPUTATION Right 05/03/2022   Procedure: RIGHT THIRD TOE AMPUTATION;  Surgeon: Nadara Mustard, MD;  Location: [facility] OR;  Service: Orthopedics;  Laterality: Right;   AMPUTATION Right 11/23/2023   Procedure: RIGHT 5TH RAY AMPUTATION;  Surgeon: Nadara Mustard, MD;  Location: [facility] OR;  Service: Orthopedics;  Laterality: Right;   CHOLECYSTECTOMY N/A 07/22/2014   Procedure: LAPAROSCOPIC CHOLECYSTECTOMY WITH INTRAOPERATIVE CHOLANGIOGRAM;  Surgeon: Harriette Bouillon, MD;  Location: [facility] OR;  Service: General;  Laterality: N/A;    Family History  Problem Relation Age of Onset   Cancer Mother        Lung   Diabetes Mother    Heart disease Father    Hyperlipidemia Father    Hypertension Father    Lung cancer Father    Mental illness Sister    Cancer Maternal Grandmother        Lung    Diabetes Maternal Grandmother     Social History   Socioeconomic History   Marital status: Single    Spouse name: Not on file   Number of children: 0   Years of education: college   Highest education level: Not on file  Occupational History   Not on file  Tobacco Use   Smoking status: Some Days    Current packs/day: 0.50    Average packs/day: 0.5 packs/day for 20.0 years (10.0 ttl pk-yrs)    Types: Cigars, Cigarettes   Smokeless tobacco: Never   Tobacco comments:    Smokes cigars 1 per month  Vaping Use   Vaping status: Never Used  Substance and Sexual Activity   Alcohol use: Yes    Alcohol/week: 3.0 standard drinks of alcohol    Types: 3 Standard drinks or equivalent per week   Drug use: No   Sexual activity: Yes  Other Topics Concern   Not on file  Social History Narrative   Drinks "some" soda and a "couple" of 5 hour energy drinks a week.    Social Drivers of Corporate investment banker Strain: Low Risk  (02/05/2024)   Overall Financial Resource Strain (CARDIA)    Difficulty of Paying Living Expenses: Not hard at all  Food Insecurity: No Food Insecurity (01/24/2024)   Hunger Vital Sign    Worried About Running Out of Food in the Last Year: Never true    Ran Out of Food in the Last Year: Never true  Transportation Needs: No Transportation Needs (01/24/2024)   PRAPARE - Administrator, Civil Service (Medical): No    Lack of Transportation (Non-Medical): No  Physical Activity: Inactive (02/05/2024)   Exercise Vital Sign    Days of Exercise per Week: 0 days    Minutes of Exercise per Session: 0 min  Stress: Stress Concern Present (02/05/2024)   [employer] of Occupational Health - Occupational Stress Questionnaire    Feeling of Stress : To some extent  Social Connections: Socially Isolated (02/05/2024)   Social Connection and Isolation Panel [NHANES]    Frequency of Communication with Friends and Family: More than three times a week    Frequency of  Social Gatherings with Friends and Family: More than three times a week    Attends Religious Services: Never    Database administrator or Organizations: No    Attends Banker Meetings: Never    Marital Status: Never married  Intimate Partner Violence: Not At Risk (01/24/2024)   Humiliation, Afraid, Rape, and Kick questionnaire    Fear of Current or Ex-Partner: No    Emotionally Abused: No    Physically Abused: No    Sexually Abused: No    Objective   BP 128/84 (BP Location: Right Arm, Patient Position: Sitting, Cuff Size: Large)   Pulse 77   Ht 6\' 2"  (1.88 m)   Wt Marland Kitchen)  335 lb (152 kg)   SpO2 97%   BMI 43.01 kg/m   Physical Exam  58 year old male in no acute distress Cardiovascular exam with regular rate and rhythm Lungs clear to auscultation bilaterally  Assessment & Plan:   Type 2 diabetes mellitus with diabetic polyneuropathy, without long-term current use of insulin (HCC) Assessment & Plan: Patient is unclear about possible diabetes diagnosis, however on chart review discussed that his A1c was elevated within diabetes range in the past on multiple tests which were above 6.5.  Discussed that this indicates diagnosis of diabetes.  Most recent A1c was within normal range, which he indicates that he did have significant dietary changes and notable weight loss, however does not ultimately change underlying diagnosis of diabetes.  At best, if A1c remains persistently normal, then diabetes can be considered in remission.  Unfortunately, he has had notable weight gain after that period of weight loss.  We can proceed with check of A1c today for assessment of current status. Will plan to complete recommended screenings at future visit including foot exam, nephropathy screening  Orders: -     Hemoglobin A1c  Thrombocytopenia (HCC) Assessment & Plan: Noted on labs during hospitalization.  We will recheck CBC today for monitoring.  Consider further evaluation with hematology  for additional assessment  Orders: -     CBC with Differential/Platelet  AKI (acute kidney injury) [facility]) Assessment & Plan: Did have recent AKI during hospitalization.  Baseline creatinine was around 0.9 with acute increase above 2.  We will recheck today for monitoring.  Encouraged to continue with follow-up as scheduled with nephrology  Orders: -     Basic metabolic panel  Atelectasis Assessment & Plan: Recommended to have follow-up chest x-ray once discharged from the hospital.  Patient is amenable to proceeding with this, will complete this today  Orders: -     DG Chest 2 View; Future  Return in about 2 months (around 04/06/2024) for diabetes.   Spent 64 minutes on this patient encounter, including preparation, chart review, face-to-face counseling with patient and coordination of care, and documentation of encounter   ___________________________________________ Dua Mehler de Peru, MD, ABFM, [facility] Primary Care and Sports Medicine [facility], [place]

## 2024-02-05 NOTE — Assessment & Plan Note (Signed)
 Did have recent AKI during hospitalization.  Baseline creatinine was around 0.9 with acute increase above 2.  We will recheck today for monitoring.  Encouraged to continue with follow-up as scheduled with nephrology

## 2024-02-05 NOTE — Assessment & Plan Note (Signed)
 Noted on labs during hospitalization.  We will recheck CBC today for monitoring.  Consider further evaluation with hematology for additional assessment

## 2024-02-05 NOTE — Patient Instructions (Signed)
  Medication Instructions:  Your physician recommends that you continue on your current medications as directed. Please refer to the Current Medication list given to you today. --If you need a refill on any your medications before your next appointment, please call your pharmacy first. If no refills are authorized on file call the office.-- Lab Work: Your physician has recommended that you have lab work today: yes If you have labs (blood work) drawn today and your tests are completely normal, you will receive your results via MyChart message OR a phone call from our staff.  Please ensure you check your voicemail in the event that you authorized detailed messages to be left on a delegated number. If you have any lab test that is abnormal or we need to change your treatment, we will call you to review the results.  Referrals/Procedures/Imaging: yes  Follow-Up: Your next appointment:   Your physician recommends that you schedule a follow-up appointment in: 2 months with Dr. de Peru  You will receive a text message or e-mail with a link to a survey about your care and experience with [place] today! We would greatly appreciate your feedback!   Thanks for letting us be apart of your health journey!!  Primary Care and Sports Medicine   Dr. Ceasar Mons Peru   We encourage you to activate your patient portal called "MyChart".  Sign up information is provided on this After Visit Summary.  MyChart is used to connect with patients for Virtual Visits (Telemedicine).  Patients are able to view lab/test results, encounter notes, upcoming appointments, etc.  Non-urgent messages can be sent to your provider as well. To learn more about what you can do with MyChart, please visit --  [email or web address].

## 2024-02-05 NOTE — Assessment & Plan Note (Signed)
 Patient is unclear about possible diabetes diagnosis, however on chart review discussed that his A1c was elevated within diabetes range in the past on multiple tests which were above 6.5.  Discussed that this indicates diagnosis of diabetes.  Most recent A1c was within normal range, which he indicates that he did have significant dietary changes and notable weight loss, however does not ultimately change underlying diagnosis of diabetes.  At best, if A1c remains persistently normal, then diabetes can be considered in remission.  Unfortunately, he has had notable weight gain after that period of weight loss.  We can proceed with check of A1c today for assessment of current status. Will plan to complete recommended screenings at future visit including foot exam, nephropathy screening

## 2024-02-06 ENCOUNTER — Encounter (HOSPITAL_BASED_OUTPATIENT_CLINIC_OR_DEPARTMENT_OTHER): Payer: Self-pay | Admitting: *Deleted

## 2024-02-06 DIAGNOSIS — L0889 Other specified local infections of the skin and subcutaneous tissue: Secondary | ICD-10-CM | POA: Diagnosis not present

## 2024-02-08 ENCOUNTER — Encounter (HOSPITAL_BASED_OUTPATIENT_CLINIC_OR_DEPARTMENT_OTHER): Payer: Self-pay | Admitting: Family Medicine

## 2024-02-13 ENCOUNTER — Encounter: Payer: Self-pay | Admitting: Family

## 2024-02-13 ENCOUNTER — Ambulatory Visit (INDEPENDENT_AMBULATORY_CARE_PROVIDER_SITE_OTHER): Admitting: Family

## 2024-02-13 DIAGNOSIS — Z89421 Acquired absence of other right toe(s): Secondary | ICD-10-CM

## 2024-02-13 DIAGNOSIS — T8781 Dehiscence of amputation stump: Secondary | ICD-10-CM

## 2024-02-13 DIAGNOSIS — E1122 Type 2 diabetes mellitus with diabetic chronic kidney disease: Secondary | ICD-10-CM | POA: Diagnosis not present

## 2024-02-13 DIAGNOSIS — D631 Anemia in chronic kidney disease: Secondary | ICD-10-CM | POA: Diagnosis not present

## 2024-02-13 DIAGNOSIS — N1832 Chronic kidney disease, stage 3b: Secondary | ICD-10-CM | POA: Diagnosis not present

## 2024-02-13 DIAGNOSIS — N189 Chronic kidney disease, unspecified: Secondary | ICD-10-CM | POA: Diagnosis not present

## 2024-02-13 NOTE — Progress Notes (Signed)
 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  His most recently been packing this open with silver cell  Assessment & Plan: Visit Diagnoses: No diagnosis found.  Plan: Will continue with current wound care given another bottle of Vashe today  Follow-Up Instructions: No follow-ups on file.   Ortho Exam  Patient is alert, oriented, no adenopathy, well-dressed, normal affect, normal respiratory effort. On examination right foot the fifth ray amputation has an area of dehiscence centrally he feels has improved this is currently measuring 3.5 x 14 mm with 4 mm of depth there is surrounding epiboly the nonviable tissue from the plantar aspect of his foot adjacent to the wound was debrided with a 10 blade knife back to viable tissue  Imaging: No results found. No images are attached to the encounter.  Labs: Lab Results  Component Value Date   HGBA1C 6.6 (H) 02/05/2024   HGBA1C 5.3 05/21/2021   HGBA1C 8.0 (H) 11/26/2016   ESRSEDRATE 90 (H) 01/26/2024   ESRSEDRATE 30 (H) 05/22/2021   ESRSEDRATE 33 (H) 05/21/2021   CRP 17.6 (H) 01/26/2024   CRP 2.3 (H) 05/22/2021   CRP 2.7 (H) 05/21/2021   LABURIC 7.2 07/31/2016   REPTSTATUS 01/25/2024 FINAL 01/24/2024   GRAMSTAIN NO WBC SEEN NO ORGANISMS SEEN  05/03/2022   CULT (A) 01/24/2024    <10,000 COLONIES/mL INSIGNIFICANT GROWTH Performed at [facility], [address], [place], [place] [address]      Lab Results  Component Value Date   ALBUMIN 3.9 11/16/2023   ALBUMIN 3.5  05/21/2021   ALBUMIN 3.4 (L) 05/21/2021    Lab Results  Component Value Date   MG 1.9 01/28/2024   No results found for: "VD25OH"  No results found for: "PREALBUMIN"    Latest Ref Rng & Units 02/05/2024   11:38 AM 01/28/2024    5:25 AM 01/27/2024    5:47 AM  CBC EXTENDED  WBC 3.4 - 10.8 x10E3/uL 12.4  6.7  6.7   RBC 4.14 - 5.80 x10E6/uL 4.73  3.64  3.83   Hemoglobin 13.0 - 17.7 g/dL 82.9  56.2  13.0   HCT 37.5 - 51.0 % 43.1  32.8  34.3   Platelets 150 - 450 x10E3/uL 255  111  104   NEUT# 1.4 - 7.0 x10E3/uL 9.1     Lymph# 0.7 - 3.1 x10E3/uL 2.0        There is no height or weight on file to calculate BMI.  Orders:  No orders of the defined types were placed in this encounter.  No orders of the defined types were placed in this encounter.    Procedures: No procedures performed  Clinical Data: No additional findings.  ROS:  All other systems negative, except as noted in the HPI. Review of Systems  Objective: Vital Signs: There were no vitals taken for this  visit.  Specialty Comments:  No specialty comments available.  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   Osteomyelitis of great toe of left foot (HCC)    Osteomyelitis of ankle or foot, acute, left (HCC) 05/21/2021   Open fracture of left great toe 05/21/2021   AKI (acute kidney injury) (HCC) 11/25/2016   Pyelonephritis 11/25/2016   Acute biliary pancreatitis 07/18/2014   Past Medical History:  Diagnosis Date   Allergy    Anxiety and depression    Diabetes mellitus without complication (HCC)    Hx of diabetes - patient has lost  weight, no meds, patient states "not a diabetic on 11/22/23"   History of kidney stones    passed stones   Sleep apnea    does not wear CPAP - lost weight    Family History  Problem Relation Age of Onset   Cancer Mother        Lung   Diabetes Mother    Heart disease Father    Hyperlipidemia Father    Hypertension Father    Lung cancer Father    Mental illness Sister    Cancer Maternal Grandmother        Lung   Diabetes Maternal Grandmother     Past Surgical History:  Procedure Laterality Date   AMPUTATION Left 05/22/2021   Procedure: GREAT TOE AMPUTATION;  Surgeon: Kathryne Hitch, MD;  Location: [facility] OR;  Service: Orthopedics;  Laterality: Left;   AMPUTATION Right 05/03/2022   Procedure: RIGHT THIRD TOE AMPUTATION;  Surgeon: Nadara Mustard, MD;  Location: [facility] OR;  Service: Orthopedics;  Laterality: Right;   AMPUTATION Right 11/23/2023   Procedure: RIGHT 5TH RAY AMPUTATION;  Surgeon: Nadara Mustard, MD;  Location: [facility] OR;  Service: Orthopedics;  Laterality: Right;   CHOLECYSTECTOMY N/A 07/22/2014   Procedure: LAPAROSCOPIC CHOLECYSTECTOMY WITH INTRAOPERATIVE CHOLANGIOGRAM;  Surgeon: Harriette Bouillon, MD;  Location: [facility] OR;  Service: General;  Laterality: N/A;   Social History   Occupational History   Not on file  Tobacco Use   Smoking status: Some Days    Current packs/day: 0.50    Average packs/day: 0.5 packs/day for 20.0 years (10.0 ttl pk-yrs)    Types: Cigars, Cigarettes   Smokeless tobacco: Never   Tobacco comments:    Smokes cigars 1 per month  Vaping Use   Vaping status: Never Used  Substance and Sexual Activity   Alcohol use: Yes    Alcohol/week: 3.0 standard drinks of alcohol    Types: 3 Standard drinks or equivalent per week   Drug use: No   Sexual activity: Yes

## 2024-02-15 DIAGNOSIS — N1832 Chronic kidney disease, stage 3b: Secondary | ICD-10-CM | POA: Diagnosis not present

## 2024-02-17 LAB — LAB REPORT - SCANNED: EGFR: 33

## 2024-03-04 DIAGNOSIS — E119 Type 2 diabetes mellitus without complications: Secondary | ICD-10-CM | POA: Diagnosis not present

## 2024-03-04 DIAGNOSIS — M869 Osteomyelitis, unspecified: Secondary | ICD-10-CM | POA: Diagnosis not present

## 2024-03-04 DIAGNOSIS — L97519 Non-pressure chronic ulcer of other part of right foot with unspecified severity: Secondary | ICD-10-CM | POA: Diagnosis not present

## 2024-03-04 DIAGNOSIS — Z1331 Encounter for screening for depression: Secondary | ICD-10-CM | POA: Diagnosis not present

## 2024-03-04 DIAGNOSIS — Z89439 Acquired absence of unspecified foot: Secondary | ICD-10-CM | POA: Diagnosis not present

## 2024-03-05 DIAGNOSIS — N1832 Chronic kidney disease, stage 3b: Secondary | ICD-10-CM | POA: Diagnosis not present

## 2024-03-12 ENCOUNTER — Encounter: Admitting: Family

## 2024-03-12 DIAGNOSIS — L97519 Non-pressure chronic ulcer of other part of right foot with unspecified severity: Secondary | ICD-10-CM | POA: Diagnosis not present

## 2024-03-13 DIAGNOSIS — N183 Chronic kidney disease, stage 3 unspecified: Secondary | ICD-10-CM | POA: Diagnosis not present

## 2024-03-13 DIAGNOSIS — I129 Hypertensive chronic kidney disease with stage 1 through stage 4 chronic kidney disease, or unspecified chronic kidney disease: Secondary | ICD-10-CM | POA: Diagnosis not present

## 2024-03-13 DIAGNOSIS — E1122 Type 2 diabetes mellitus with diabetic chronic kidney disease: Secondary | ICD-10-CM | POA: Diagnosis not present

## 2024-04-10 ENCOUNTER — Other Ambulatory Visit: Payer: Self-pay | Admitting: Physician Assistant

## 2024-04-10 DIAGNOSIS — R319 Hematuria, unspecified: Secondary | ICD-10-CM

## 2024-04-11 ENCOUNTER — Ambulatory Visit
Admission: RE | Admit: 2024-04-11 | Discharge: 2024-04-11 | Disposition: A | Discharge status: Home / Self Care | Source: Ambulatory Visit | Attending: Physician Assistant | Admitting: Physician Assistant

## 2024-04-11 DIAGNOSIS — R319 Hematuria, unspecified: Secondary | ICD-10-CM

## 2024-09-22 ENCOUNTER — Encounter: Payer: Self-pay | Admitting: Radiology
